# Patient Record
Sex: Female | Born: 1955 | Race: Black or African American | Hispanic: No | Smoking: Never smoker
Health system: Southern US, Community
[De-identification: ages and names within clinical notes are randomized; demographics above are authoritative.]

## PROBLEM LIST (undated history)

## (undated) DIAGNOSIS — M25551 Pain in right hip: Secondary | ICD-10-CM

## (undated) DIAGNOSIS — F32A Depression, unspecified: Secondary | ICD-10-CM

## (undated) DIAGNOSIS — F419 Anxiety disorder, unspecified: Secondary | ICD-10-CM

## (undated) DIAGNOSIS — M25519 Pain in unspecified shoulder: Secondary | ICD-10-CM

## (undated) DIAGNOSIS — F329 Major depressive disorder, single episode, unspecified: Secondary | ICD-10-CM

## (undated) DIAGNOSIS — I10 Essential (primary) hypertension: Secondary | ICD-10-CM

## (undated) DIAGNOSIS — E785 Hyperlipidemia, unspecified: Secondary | ICD-10-CM

## (undated) DIAGNOSIS — M1990 Unspecified osteoarthritis, unspecified site: Secondary | ICD-10-CM

## (undated) DIAGNOSIS — K5903 Drug induced constipation: Secondary | ICD-10-CM

## (undated) DIAGNOSIS — M75120 Complete rotator cuff tear or rupture of unspecified shoulder, not specified as traumatic: Secondary | ICD-10-CM

## (undated) DIAGNOSIS — E669 Obesity, unspecified: Secondary | ICD-10-CM

## (undated) DIAGNOSIS — S83249A Other tear of medial meniscus, current injury, unspecified knee, initial encounter: Secondary | ICD-10-CM

## (undated) DIAGNOSIS — M7542 Impingement syndrome of left shoulder: Secondary | ICD-10-CM

## (undated) DIAGNOSIS — S53003A Unspecified subluxation of unspecified radial head, initial encounter: Secondary | ICD-10-CM

## (undated) DIAGNOSIS — K529 Noninfective gastroenteritis and colitis, unspecified: Secondary | ICD-10-CM

## (undated) DIAGNOSIS — T402X5A Adverse effect of other opioids, initial encounter: Secondary | ICD-10-CM

## (undated) HISTORY — DX: Impingement syndrome of left shoulder: M75.42

## (undated) HISTORY — DX: Drug induced constipation: K59.03

## (undated) HISTORY — DX: Pain in right hip: M25.551

## (undated) HISTORY — DX: Pain in unspecified shoulder: M25.519

## (undated) HISTORY — PX: KNEE ARTHROSCOPY: SHX127

## (undated) HISTORY — DX: Depression, unspecified: F32.A

## (undated) HISTORY — PX: PARTIAL HIP ARTHROPLASTY: SHX733

## (undated) HISTORY — DX: Unspecified subluxation of unspecified radial head, initial encounter: S53.003A

## (undated) HISTORY — PX: FOOT SURGERY: SHX648

## (undated) HISTORY — DX: Essential (primary) hypertension: I10

## (undated) HISTORY — DX: Anxiety disorder, unspecified: F41.9

## (undated) HISTORY — DX: Obesity, unspecified: E66.9

## (undated) HISTORY — DX: Adverse effect of other opioids, initial encounter: T40.2X5A

## (undated) HISTORY — DX: Unspecified osteoarthritis, unspecified site: M19.90

## (undated) HISTORY — DX: Major depressive disorder, single episode, unspecified: F32.9

## (undated) HISTORY — DX: Hyperlipidemia, unspecified: E78.5

## (undated) HISTORY — PX: OTHER SURGICAL HISTORY: SHX169

## (undated) HISTORY — DX: Complete rotator cuff tear or rupture of unspecified shoulder, not specified as traumatic: M75.120

## (undated) HISTORY — DX: Other tear of medial meniscus, current injury, unspecified knee, initial encounter: S83.249A

## (undated) HISTORY — DX: Noninfective gastroenteritis and colitis, unspecified: K52.9

---

## 1993-10-28 HISTORY — PX: HIP FRACTURE SURGERY: SHX118

## 2001-02-07 ENCOUNTER — Encounter: Payer: Self-pay | Admitting: Emergency Medicine

## 2001-02-07 ENCOUNTER — Inpatient Hospital Stay (HOSPITAL_COMMUNITY): Admission: EM | Admit: 2001-02-07 | Discharge: 2001-02-16 | Payer: Self-pay | Admitting: *Deleted

## 2001-02-20 ENCOUNTER — Encounter: Payer: Self-pay | Admitting: Emergency Medicine

## 2001-02-20 ENCOUNTER — Emergency Department (HOSPITAL_COMMUNITY): Admission: EM | Admit: 2001-02-20 | Discharge: 2001-02-20 | Payer: Self-pay | Admitting: Emergency Medicine

## 2001-02-27 ENCOUNTER — Inpatient Hospital Stay (HOSPITAL_COMMUNITY): Admission: EM | Admit: 2001-02-27 | Discharge: 2001-03-02 | Payer: Self-pay | Admitting: *Deleted

## 2001-03-19 ENCOUNTER — Inpatient Hospital Stay (HOSPITAL_COMMUNITY): Admission: EM | Admit: 2001-03-19 | Discharge: 2001-03-20 | Payer: Self-pay | Admitting: *Deleted

## 2001-04-01 ENCOUNTER — Encounter: Payer: Self-pay | Admitting: Emergency Medicine

## 2001-04-01 ENCOUNTER — Emergency Department (HOSPITAL_COMMUNITY): Admission: EM | Admit: 2001-04-01 | Discharge: 2001-04-01 | Payer: Self-pay | Admitting: Emergency Medicine

## 2001-09-01 ENCOUNTER — Other Ambulatory Visit: Admission: RE | Admit: 2001-09-01 | Discharge: 2001-09-01 | Payer: Self-pay | Admitting: Obstetrics and Gynecology

## 2001-11-30 ENCOUNTER — Ambulatory Visit (HOSPITAL_COMMUNITY): Admission: RE | Admit: 2001-11-30 | Discharge: 2001-11-30 | Payer: Self-pay | Admitting: Internal Medicine

## 2001-11-30 ENCOUNTER — Encounter: Payer: Self-pay | Admitting: Internal Medicine

## 2002-01-05 ENCOUNTER — Inpatient Hospital Stay (HOSPITAL_COMMUNITY): Admission: EM | Admit: 2002-01-05 | Discharge: 2002-01-06 | Payer: Self-pay | Admitting: Emergency Medicine

## 2002-01-06 ENCOUNTER — Inpatient Hospital Stay (HOSPITAL_COMMUNITY): Admission: EM | Admit: 2002-01-06 | Discharge: 2002-01-08 | Payer: Self-pay | Admitting: *Deleted

## 2002-04-02 ENCOUNTER — Emergency Department (HOSPITAL_COMMUNITY): Admission: EM | Admit: 2002-04-02 | Discharge: 2002-04-02 | Payer: Self-pay | Admitting: Emergency Medicine

## 2002-09-27 ENCOUNTER — Ambulatory Visit (HOSPITAL_COMMUNITY): Admission: RE | Admit: 2002-09-27 | Discharge: 2002-09-27 | Payer: Self-pay | Admitting: Obstetrics and Gynecology

## 2002-09-27 ENCOUNTER — Encounter: Payer: Self-pay | Admitting: Obstetrics and Gynecology

## 2002-12-02 ENCOUNTER — Encounter: Payer: Self-pay | Admitting: Internal Medicine

## 2002-12-02 ENCOUNTER — Ambulatory Visit (HOSPITAL_COMMUNITY): Admission: RE | Admit: 2002-12-02 | Discharge: 2002-12-02 | Payer: Self-pay | Admitting: Internal Medicine

## 2004-02-02 ENCOUNTER — Ambulatory Visit (HOSPITAL_COMMUNITY): Admission: RE | Admit: 2004-02-02 | Discharge: 2004-02-02 | Payer: Self-pay | Admitting: Obstetrics and Gynecology

## 2004-03-06 ENCOUNTER — Inpatient Hospital Stay (HOSPITAL_COMMUNITY): Admission: EM | Admit: 2004-03-06 | Discharge: 2004-03-10 | Payer: Self-pay | Admitting: Emergency Medicine

## 2005-01-03 ENCOUNTER — Emergency Department (HOSPITAL_COMMUNITY): Admission: EM | Admit: 2005-01-03 | Discharge: 2005-01-03 | Payer: Self-pay | Admitting: Emergency Medicine

## 2005-02-25 ENCOUNTER — Ambulatory Visit (HOSPITAL_COMMUNITY): Admission: RE | Admit: 2005-02-25 | Discharge: 2005-02-25 | Payer: Self-pay | Admitting: Family Medicine

## 2005-03-27 ENCOUNTER — Emergency Department (HOSPITAL_COMMUNITY): Admission: EM | Admit: 2005-03-27 | Discharge: 2005-03-27 | Payer: Self-pay | Admitting: Emergency Medicine

## 2005-04-10 ENCOUNTER — Ambulatory Visit: Payer: Self-pay | Admitting: Orthopedic Surgery

## 2005-06-11 ENCOUNTER — Encounter (INDEPENDENT_AMBULATORY_CARE_PROVIDER_SITE_OTHER): Payer: Self-pay | Admitting: Family Medicine

## 2006-02-27 ENCOUNTER — Ambulatory Visit (HOSPITAL_COMMUNITY): Admission: RE | Admit: 2006-02-27 | Discharge: 2006-02-27 | Payer: Self-pay | Admitting: Family Medicine

## 2006-03-01 ENCOUNTER — Emergency Department (HOSPITAL_COMMUNITY): Admission: EM | Admit: 2006-03-01 | Discharge: 2006-03-01 | Payer: Self-pay | Admitting: Emergency Medicine

## 2006-03-19 ENCOUNTER — Ambulatory Visit: Payer: Self-pay | Admitting: Orthopedic Surgery

## 2006-05-08 ENCOUNTER — Ambulatory Visit (HOSPITAL_COMMUNITY): Admission: RE | Admit: 2006-05-08 | Discharge: 2006-05-08 | Payer: Self-pay | Admitting: Family Medicine

## 2006-06-10 ENCOUNTER — Ambulatory Visit: Payer: Self-pay | Admitting: Family Medicine

## 2006-06-11 ENCOUNTER — Encounter (INDEPENDENT_AMBULATORY_CARE_PROVIDER_SITE_OTHER): Payer: Self-pay | Admitting: Family Medicine

## 2006-06-11 LAB — CONVERTED CEMR LAB: Pap Smear: NORMAL

## 2006-06-24 ENCOUNTER — Ambulatory Visit: Payer: Self-pay | Admitting: Family Medicine

## 2006-06-24 ENCOUNTER — Ambulatory Visit (HOSPITAL_COMMUNITY): Admission: RE | Admit: 2006-06-24 | Discharge: 2006-06-24 | Payer: Self-pay | Admitting: Family Medicine

## 2006-07-10 ENCOUNTER — Ambulatory Visit: Payer: Self-pay | Admitting: Family Medicine

## 2006-07-25 ENCOUNTER — Ambulatory Visit: Payer: Self-pay | Admitting: Family Medicine

## 2006-09-11 ENCOUNTER — Ambulatory Visit (HOSPITAL_COMMUNITY): Admission: RE | Admit: 2006-09-11 | Discharge: 2006-09-11 | Payer: Self-pay | Admitting: Family Medicine

## 2006-09-11 ENCOUNTER — Ambulatory Visit: Payer: Self-pay | Admitting: Family Medicine

## 2006-09-16 ENCOUNTER — Encounter (INDEPENDENT_AMBULATORY_CARE_PROVIDER_SITE_OTHER): Payer: Self-pay | Admitting: Family Medicine

## 2006-09-16 ENCOUNTER — Ambulatory Visit (HOSPITAL_COMMUNITY): Admission: RE | Admit: 2006-09-16 | Discharge: 2006-09-16 | Payer: Self-pay | Admitting: Family Medicine

## 2006-09-25 ENCOUNTER — Ambulatory Visit: Payer: Self-pay | Admitting: Family Medicine

## 2006-09-26 ENCOUNTER — Encounter (INDEPENDENT_AMBULATORY_CARE_PROVIDER_SITE_OTHER): Payer: Self-pay | Admitting: Family Medicine

## 2006-09-27 ENCOUNTER — Encounter: Payer: Self-pay | Admitting: Family Medicine

## 2006-09-27 DIAGNOSIS — F329 Major depressive disorder, single episode, unspecified: Secondary | ICD-10-CM

## 2006-09-27 DIAGNOSIS — E785 Hyperlipidemia, unspecified: Secondary | ICD-10-CM

## 2006-09-27 DIAGNOSIS — IMO0002 Reserved for concepts with insufficient information to code with codable children: Secondary | ICD-10-CM | POA: Insufficient documentation

## 2006-09-27 DIAGNOSIS — E669 Obesity, unspecified: Secondary | ICD-10-CM

## 2006-09-27 DIAGNOSIS — F411 Generalized anxiety disorder: Secondary | ICD-10-CM | POA: Insufficient documentation

## 2006-09-27 DIAGNOSIS — I10 Essential (primary) hypertension: Secondary | ICD-10-CM | POA: Insufficient documentation

## 2006-09-27 DIAGNOSIS — M1990 Unspecified osteoarthritis, unspecified site: Secondary | ICD-10-CM | POA: Insufficient documentation

## 2006-09-29 ENCOUNTER — Ambulatory Visit: Payer: Self-pay | Admitting: Orthopedic Surgery

## 2006-10-03 ENCOUNTER — Ambulatory Visit (HOSPITAL_COMMUNITY): Admission: RE | Admit: 2006-10-03 | Discharge: 2006-10-03 | Payer: Self-pay | Admitting: Orthopedic Surgery

## 2006-10-03 ENCOUNTER — Ambulatory Visit: Payer: Self-pay | Admitting: Orthopedic Surgery

## 2006-10-03 HISTORY — PX: OTHER SURGICAL HISTORY: SHX169

## 2006-10-06 ENCOUNTER — Ambulatory Visit: Payer: Self-pay | Admitting: Orthopedic Surgery

## 2006-10-15 ENCOUNTER — Encounter (HOSPITAL_COMMUNITY): Admission: RE | Admit: 2006-10-15 | Discharge: 2006-10-27 | Payer: Self-pay | Admitting: Orthopedic Surgery

## 2006-10-29 ENCOUNTER — Ambulatory Visit: Payer: Self-pay | Admitting: Orthopedic Surgery

## 2006-10-30 ENCOUNTER — Encounter (HOSPITAL_COMMUNITY): Admission: RE | Admit: 2006-10-30 | Discharge: 2006-11-29 | Payer: Self-pay | Admitting: Orthopedic Surgery

## 2006-11-19 ENCOUNTER — Ambulatory Visit: Payer: Self-pay | Admitting: Orthopedic Surgery

## 2006-12-02 ENCOUNTER — Encounter (HOSPITAL_COMMUNITY): Admission: RE | Admit: 2006-12-02 | Discharge: 2007-01-01 | Payer: Self-pay | Admitting: Orthopedic Surgery

## 2006-12-04 ENCOUNTER — Ambulatory Visit: Payer: Self-pay | Admitting: Family Medicine

## 2006-12-04 LAB — CONVERTED CEMR LAB
ALT: 10 units/L (ref 0–35)
Basophils Absolute: 0 10*3/uL (ref 0.0–0.1)
CO2: 25 meq/L (ref 19–32)
Calcium: 9.7 mg/dL (ref 8.4–10.5)
Chloride: 100 meq/L (ref 96–112)
Cholesterol: 154 mg/dL (ref 0–200)
Glucose, Bld: 100 mg/dL — ABNORMAL HIGH (ref 70–99)
Hemoglobin: 12.3 g/dL (ref 12.0–15.0)
Lymphocytes Relative: 30 % (ref 12–46)
Lymphs Abs: 2 10*3/uL (ref 0.7–3.3)
Monocytes Absolute: 0.6 10*3/uL (ref 0.2–0.7)
Neutro Abs: 3.7 10*3/uL (ref 1.7–7.7)
Platelets: 270 10*3/uL (ref 150–400)
RDW: 13.5 % (ref 11.5–14.0)
Sodium: 137 meq/L (ref 135–145)
Total Protein: 7.4 g/dL (ref 6.0–8.3)
WBC: 6.6 10*3/uL (ref 4.0–10.5)

## 2006-12-05 ENCOUNTER — Telehealth (INDEPENDENT_AMBULATORY_CARE_PROVIDER_SITE_OTHER): Payer: Self-pay | Admitting: Family Medicine

## 2007-01-19 ENCOUNTER — Encounter (HOSPITAL_COMMUNITY): Admission: RE | Admit: 2007-01-19 | Discharge: 2007-02-18 | Payer: Self-pay | Admitting: Podiatry

## 2007-03-03 ENCOUNTER — Ambulatory Visit (HOSPITAL_COMMUNITY): Admission: RE | Admit: 2007-03-03 | Discharge: 2007-03-03 | Payer: Self-pay | Admitting: Family Medicine

## 2007-03-05 ENCOUNTER — Ambulatory Visit: Payer: Self-pay | Admitting: Family Medicine

## 2007-03-05 LAB — CONVERTED CEMR LAB
Cholesterol, target level: 200 mg/dL
HDL goal, serum: 40 mg/dL
LDL Goal: 160 mg/dL

## 2007-03-11 ENCOUNTER — Emergency Department (HOSPITAL_COMMUNITY): Admission: EM | Admit: 2007-03-11 | Discharge: 2007-03-11 | Payer: Self-pay | Admitting: *Deleted

## 2007-03-17 ENCOUNTER — Ambulatory Visit (HOSPITAL_COMMUNITY): Admission: RE | Admit: 2007-03-17 | Discharge: 2007-03-17 | Payer: Self-pay | Admitting: Family Medicine

## 2007-03-19 ENCOUNTER — Emergency Department (HOSPITAL_COMMUNITY): Admission: EM | Admit: 2007-03-19 | Discharge: 2007-03-19 | Payer: Self-pay | Admitting: Emergency Medicine

## 2007-03-24 ENCOUNTER — Encounter (INDEPENDENT_AMBULATORY_CARE_PROVIDER_SITE_OTHER): Payer: Self-pay | Admitting: Family Medicine

## 2007-04-02 ENCOUNTER — Ambulatory Visit: Payer: Self-pay | Admitting: Orthopedic Surgery

## 2007-04-03 ENCOUNTER — Ambulatory Visit: Payer: Self-pay | Admitting: Family Medicine

## 2007-05-19 ENCOUNTER — Ambulatory Visit: Payer: Self-pay | Admitting: Orthopedic Surgery

## 2007-05-26 ENCOUNTER — Encounter (HOSPITAL_COMMUNITY): Admission: RE | Admit: 2007-05-26 | Discharge: 2007-06-25 | Payer: Self-pay | Admitting: Orthopedic Surgery

## 2007-06-01 ENCOUNTER — Ambulatory Visit: Payer: Self-pay | Admitting: Orthopedic Surgery

## 2007-07-22 ENCOUNTER — Ambulatory Visit: Payer: Self-pay | Admitting: Family Medicine

## 2007-07-23 ENCOUNTER — Telehealth (INDEPENDENT_AMBULATORY_CARE_PROVIDER_SITE_OTHER): Payer: Self-pay | Admitting: *Deleted

## 2007-07-23 ENCOUNTER — Telehealth (INDEPENDENT_AMBULATORY_CARE_PROVIDER_SITE_OTHER): Payer: Self-pay | Admitting: Family Medicine

## 2007-07-23 LAB — CONVERTED CEMR LAB
AST: 15 units/L (ref 0–37)
Albumin: 3.9 g/dL (ref 3.5–5.2)
BUN: 13 mg/dL (ref 6–23)
Basophils Relative: 0 % (ref 0–1)
Calcium: 9.5 mg/dL (ref 8.4–10.5)
Chloride: 106 meq/L (ref 96–112)
Lymphocytes Relative: 44 % (ref 12–46)
Lymphs Abs: 2.9 10*3/uL (ref 0.7–3.3)
MCHC: 33 g/dL (ref 30.0–36.0)
Monocytes Relative: 6 % (ref 3–11)
Neutro Abs: 3.1 10*3/uL (ref 1.7–7.7)
Neutrophils Relative %: 47 % (ref 43–77)
Potassium: 4 meq/L (ref 3.5–5.3)
RBC: 4.3 M/uL (ref 3.87–5.11)
WBC: 6.7 10*3/uL (ref 4.0–10.5)

## 2007-07-26 ENCOUNTER — Encounter (INDEPENDENT_AMBULATORY_CARE_PROVIDER_SITE_OTHER): Payer: Self-pay | Admitting: Family Medicine

## 2007-08-20 ENCOUNTER — Other Ambulatory Visit: Admission: RE | Admit: 2007-08-20 | Discharge: 2007-08-20 | Payer: Self-pay | Admitting: Family Medicine

## 2007-08-20 ENCOUNTER — Encounter (INDEPENDENT_AMBULATORY_CARE_PROVIDER_SITE_OTHER): Payer: Self-pay | Admitting: Family Medicine

## 2007-08-20 ENCOUNTER — Ambulatory Visit: Payer: Self-pay | Admitting: Family Medicine

## 2007-08-26 ENCOUNTER — Telehealth (INDEPENDENT_AMBULATORY_CARE_PROVIDER_SITE_OTHER): Payer: Self-pay | Admitting: *Deleted

## 2007-10-02 ENCOUNTER — Encounter: Payer: Self-pay | Admitting: Orthopedic Surgery

## 2007-10-20 ENCOUNTER — Ambulatory Visit: Payer: Self-pay | Admitting: Orthopedic Surgery

## 2007-10-29 HISTORY — PX: OTHER SURGICAL HISTORY: SHX169

## 2007-11-08 ENCOUNTER — Encounter (INDEPENDENT_AMBULATORY_CARE_PROVIDER_SITE_OTHER): Payer: Self-pay | Admitting: Family Medicine

## 2007-11-19 ENCOUNTER — Telehealth (INDEPENDENT_AMBULATORY_CARE_PROVIDER_SITE_OTHER): Payer: Self-pay | Admitting: *Deleted

## 2007-11-19 ENCOUNTER — Ambulatory Visit: Payer: Self-pay | Admitting: Family Medicine

## 2007-11-27 ENCOUNTER — Ambulatory Visit: Payer: Self-pay | Admitting: Family Medicine

## 2007-12-02 ENCOUNTER — Ambulatory Visit: Payer: Self-pay | Admitting: Family Medicine

## 2007-12-02 LAB — CONVERTED CEMR LAB: Inflenza A Ag: NEGATIVE

## 2007-12-17 ENCOUNTER — Ambulatory Visit: Payer: Self-pay | Admitting: Family Medicine

## 2007-12-18 ENCOUNTER — Telehealth (INDEPENDENT_AMBULATORY_CARE_PROVIDER_SITE_OTHER): Payer: Self-pay | Admitting: *Deleted

## 2007-12-18 LAB — CONVERTED CEMR LAB
ALT: 10 units/L (ref 0–35)
AST: 15 units/L (ref 0–37)
Alkaline Phosphatase: 72 units/L (ref 39–117)
Basophils Absolute: 0 10*3/uL (ref 0.0–0.1)
Basophils Relative: 0 % (ref 0–1)
Creatinine, Ser: 0.83 mg/dL (ref 0.40–1.20)
Eosinophils Relative: 5 % (ref 0–5)
HCT: 40 % (ref 36.0–46.0)
Hemoglobin: 13 g/dL (ref 12.0–15.0)
MCHC: 32.5 g/dL (ref 30.0–36.0)
Monocytes Absolute: 0.4 10*3/uL (ref 0.1–1.0)
Neutro Abs: 3.2 10*3/uL (ref 1.7–7.7)
RDW: 13.6 % (ref 11.5–15.5)
TSH: 1.608 microintl units/mL (ref 0.350–5.50)
Total Bilirubin: 0.6 mg/dL (ref 0.3–1.2)
Total CHOL/HDL Ratio: 3.4
VLDL: 25 mg/dL (ref 0–40)

## 2007-12-22 ENCOUNTER — Encounter (INDEPENDENT_AMBULATORY_CARE_PROVIDER_SITE_OTHER): Payer: Self-pay | Admitting: Family Medicine

## 2008-01-25 ENCOUNTER — Ambulatory Visit: Payer: Self-pay | Admitting: Orthopedic Surgery

## 2008-01-25 DIAGNOSIS — M25519 Pain in unspecified shoulder: Secondary | ICD-10-CM

## 2008-02-17 ENCOUNTER — Ambulatory Visit: Payer: Self-pay | Admitting: Orthopedic Surgery

## 2008-02-23 ENCOUNTER — Ambulatory Visit (HOSPITAL_COMMUNITY): Admission: RE | Admit: 2008-02-23 | Discharge: 2008-02-23 | Payer: Self-pay | Admitting: Orthopedic Surgery

## 2008-03-01 ENCOUNTER — Ambulatory Visit: Payer: Self-pay | Admitting: Orthopedic Surgery

## 2008-03-08 ENCOUNTER — Ambulatory Visit: Payer: Self-pay | Admitting: Orthopedic Surgery

## 2008-03-15 ENCOUNTER — Telehealth: Payer: Self-pay | Admitting: Orthopedic Surgery

## 2008-03-17 ENCOUNTER — Ambulatory Visit: Payer: Self-pay | Admitting: Family Medicine

## 2008-03-17 DIAGNOSIS — N951 Menopausal and female climacteric states: Secondary | ICD-10-CM | POA: Insufficient documentation

## 2008-03-18 ENCOUNTER — Telehealth (INDEPENDENT_AMBULATORY_CARE_PROVIDER_SITE_OTHER): Payer: Self-pay | Admitting: Family Medicine

## 2008-03-18 ENCOUNTER — Emergency Department (HOSPITAL_COMMUNITY): Admission: RE | Admit: 2008-03-18 | Discharge: 2008-03-18 | Payer: Self-pay | Admitting: Orthopedic Surgery

## 2008-03-18 ENCOUNTER — Encounter (INDEPENDENT_AMBULATORY_CARE_PROVIDER_SITE_OTHER): Payer: Self-pay | Admitting: Family Medicine

## 2008-03-22 ENCOUNTER — Telehealth (INDEPENDENT_AMBULATORY_CARE_PROVIDER_SITE_OTHER): Payer: Self-pay | Admitting: Family Medicine

## 2008-03-22 ENCOUNTER — Ambulatory Visit: Payer: Self-pay | Admitting: Family Medicine

## 2008-03-23 ENCOUNTER — Encounter: Payer: Self-pay | Admitting: Orthopedic Surgery

## 2008-03-23 ENCOUNTER — Ambulatory Visit: Payer: Self-pay | Admitting: Cardiovascular Disease

## 2008-03-25 ENCOUNTER — Encounter: Payer: Self-pay | Admitting: Cardiovascular Disease

## 2008-03-25 ENCOUNTER — Ambulatory Visit (HOSPITAL_COMMUNITY): Admission: RE | Admit: 2008-03-25 | Discharge: 2008-03-25 | Payer: Self-pay | Admitting: Cardiovascular Disease

## 2008-03-25 ENCOUNTER — Ambulatory Visit: Payer: Self-pay | Admitting: Cardiology

## 2008-03-28 ENCOUNTER — Telehealth: Payer: Self-pay | Admitting: Orthopedic Surgery

## 2008-03-30 ENCOUNTER — Ambulatory Visit: Payer: Self-pay | Admitting: Orthopedic Surgery

## 2008-04-01 ENCOUNTER — Emergency Department (HOSPITAL_COMMUNITY): Admission: EM | Admit: 2008-04-01 | Discharge: 2008-04-01 | Payer: Self-pay | Admitting: Emergency Medicine

## 2008-04-01 ENCOUNTER — Encounter: Payer: Self-pay | Admitting: Orthopedic Surgery

## 2008-04-04 ENCOUNTER — Ambulatory Visit: Payer: Self-pay | Admitting: Orthopedic Surgery

## 2008-04-04 ENCOUNTER — Telehealth: Payer: Self-pay | Admitting: Orthopedic Surgery

## 2008-04-06 ENCOUNTER — Encounter (INDEPENDENT_AMBULATORY_CARE_PROVIDER_SITE_OTHER): Payer: Self-pay | Admitting: Family Medicine

## 2008-04-08 ENCOUNTER — Ambulatory Visit: Payer: Self-pay | Admitting: Orthopedic Surgery

## 2008-04-08 ENCOUNTER — Ambulatory Visit (HOSPITAL_COMMUNITY): Admission: RE | Admit: 2008-04-08 | Discharge: 2008-04-08 | Payer: Self-pay | Admitting: Orthopedic Surgery

## 2008-04-11 ENCOUNTER — Telehealth: Payer: Self-pay | Admitting: Orthopedic Surgery

## 2008-04-12 ENCOUNTER — Ambulatory Visit: Payer: Self-pay | Admitting: Orthopedic Surgery

## 2008-04-18 ENCOUNTER — Encounter (HOSPITAL_COMMUNITY): Admission: RE | Admit: 2008-04-18 | Discharge: 2008-05-18 | Payer: Self-pay | Admitting: Orthopedic Surgery

## 2008-04-18 ENCOUNTER — Encounter: Payer: Self-pay | Admitting: Orthopedic Surgery

## 2008-04-19 ENCOUNTER — Ambulatory Visit: Payer: Self-pay | Admitting: Family Medicine

## 2008-04-19 LAB — CONVERTED CEMR LAB
Bilirubin Urine: NEGATIVE
Protein, U semiquant: 100
Urobilinogen, UA: 1
pH: 6

## 2008-04-20 ENCOUNTER — Ambulatory Visit: Payer: Self-pay | Admitting: Orthopedic Surgery

## 2008-04-22 ENCOUNTER — Ambulatory Visit (HOSPITAL_COMMUNITY): Admission: RE | Admit: 2008-04-22 | Discharge: 2008-04-22 | Payer: Self-pay | Admitting: Family Medicine

## 2008-04-26 ENCOUNTER — Encounter: Payer: Self-pay | Admitting: Orthopedic Surgery

## 2008-05-12 ENCOUNTER — Telehealth: Payer: Self-pay | Admitting: Orthopedic Surgery

## 2008-05-18 ENCOUNTER — Encounter: Payer: Self-pay | Admitting: Orthopedic Surgery

## 2008-05-19 ENCOUNTER — Ambulatory Visit: Payer: Self-pay | Admitting: Orthopedic Surgery

## 2008-05-20 ENCOUNTER — Encounter (HOSPITAL_COMMUNITY): Admission: RE | Admit: 2008-05-20 | Discharge: 2008-06-19 | Payer: Self-pay | Admitting: Orthopedic Surgery

## 2008-05-23 ENCOUNTER — Telehealth (INDEPENDENT_AMBULATORY_CARE_PROVIDER_SITE_OTHER): Payer: Self-pay | Admitting: *Deleted

## 2008-06-03 ENCOUNTER — Telehealth (INDEPENDENT_AMBULATORY_CARE_PROVIDER_SITE_OTHER): Payer: Self-pay | Admitting: *Deleted

## 2008-06-06 ENCOUNTER — Encounter (INDEPENDENT_AMBULATORY_CARE_PROVIDER_SITE_OTHER): Payer: Self-pay | Admitting: Family Medicine

## 2008-06-06 ENCOUNTER — Telehealth: Payer: Self-pay | Admitting: Orthopedic Surgery

## 2008-06-10 ENCOUNTER — Telehealth (INDEPENDENT_AMBULATORY_CARE_PROVIDER_SITE_OTHER): Payer: Self-pay | Admitting: Family Medicine

## 2008-06-12 ENCOUNTER — Encounter (INDEPENDENT_AMBULATORY_CARE_PROVIDER_SITE_OTHER): Payer: Self-pay | Admitting: Family Medicine

## 2008-06-14 ENCOUNTER — Ambulatory Visit: Payer: Self-pay | Admitting: Family Medicine

## 2008-06-15 ENCOUNTER — Encounter: Payer: Self-pay | Admitting: Orthopedic Surgery

## 2008-06-17 ENCOUNTER — Encounter (INDEPENDENT_AMBULATORY_CARE_PROVIDER_SITE_OTHER): Payer: Self-pay | Admitting: Family Medicine

## 2008-06-20 ENCOUNTER — Encounter (HOSPITAL_COMMUNITY): Admission: RE | Admit: 2008-06-20 | Discharge: 2008-07-20 | Payer: Self-pay | Admitting: Orthopedic Surgery

## 2008-06-20 ENCOUNTER — Telehealth: Payer: Self-pay | Admitting: Orthopedic Surgery

## 2008-06-22 ENCOUNTER — Telehealth: Payer: Self-pay | Admitting: Orthopedic Surgery

## 2008-06-22 ENCOUNTER — Ambulatory Visit: Payer: Self-pay | Admitting: Orthopedic Surgery

## 2008-07-13 ENCOUNTER — Encounter (INDEPENDENT_AMBULATORY_CARE_PROVIDER_SITE_OTHER): Payer: Self-pay | Admitting: Pulmonary Disease

## 2008-07-19 ENCOUNTER — Ambulatory Visit: Payer: Self-pay | Admitting: Orthopedic Surgery

## 2008-07-19 ENCOUNTER — Ambulatory Visit: Payer: Self-pay | Admitting: Family Medicine

## 2008-07-19 DIAGNOSIS — M25559 Pain in unspecified hip: Secondary | ICD-10-CM | POA: Insufficient documentation

## 2008-07-20 ENCOUNTER — Encounter (INDEPENDENT_AMBULATORY_CARE_PROVIDER_SITE_OTHER): Payer: Self-pay | Admitting: Family Medicine

## 2008-07-20 LAB — CONVERTED CEMR LAB
ALT: 10 units/L (ref 0–35)
BUN: 13 mg/dL (ref 6–23)
CO2: 22 meq/L (ref 19–32)
Creatinine, Ser: 0.61 mg/dL (ref 0.40–1.20)
Total Bilirubin: 0.7 mg/dL (ref 0.3–1.2)

## 2008-07-22 ENCOUNTER — Encounter (HOSPITAL_COMMUNITY): Admission: RE | Admit: 2008-07-22 | Discharge: 2008-07-27 | Payer: Self-pay | Admitting: Orthopedic Surgery

## 2008-07-25 ENCOUNTER — Telehealth (INDEPENDENT_AMBULATORY_CARE_PROVIDER_SITE_OTHER): Payer: Self-pay | Admitting: *Deleted

## 2008-07-28 ENCOUNTER — Ambulatory Visit: Payer: Self-pay | Admitting: Family Medicine

## 2008-07-29 ENCOUNTER — Encounter (HOSPITAL_COMMUNITY): Admission: RE | Admit: 2008-07-29 | Discharge: 2008-08-28 | Payer: Self-pay | Admitting: Orthopedic Surgery

## 2008-07-29 ENCOUNTER — Telehealth: Payer: Self-pay | Admitting: Orthopedic Surgery

## 2008-07-29 ENCOUNTER — Encounter: Payer: Self-pay | Admitting: Orthopedic Surgery

## 2008-08-01 ENCOUNTER — Ambulatory Visit (HOSPITAL_COMMUNITY): Admission: RE | Admit: 2008-08-01 | Discharge: 2008-08-01 | Payer: Self-pay | Admitting: Family Medicine

## 2008-08-01 ENCOUNTER — Telehealth (INDEPENDENT_AMBULATORY_CARE_PROVIDER_SITE_OTHER): Payer: Self-pay | Admitting: Family Medicine

## 2008-08-10 ENCOUNTER — Encounter (HOSPITAL_COMMUNITY): Admission: RE | Admit: 2008-08-10 | Discharge: 2008-09-09 | Payer: Self-pay | Admitting: Family Medicine

## 2008-08-10 ENCOUNTER — Encounter (INDEPENDENT_AMBULATORY_CARE_PROVIDER_SITE_OTHER): Payer: Self-pay | Admitting: Family Medicine

## 2008-08-11 ENCOUNTER — Ambulatory Visit: Payer: Self-pay | Admitting: Family Medicine

## 2008-08-11 DIAGNOSIS — J309 Allergic rhinitis, unspecified: Secondary | ICD-10-CM | POA: Insufficient documentation

## 2008-08-12 ENCOUNTER — Encounter: Payer: Self-pay | Admitting: Orthopedic Surgery

## 2008-08-12 ENCOUNTER — Encounter (INDEPENDENT_AMBULATORY_CARE_PROVIDER_SITE_OTHER): Payer: Self-pay | Admitting: Family Medicine

## 2008-08-12 LAB — CONVERTED CEMR LAB
Albumin: 3.9 g/dL (ref 3.5–5.2)
CO2: 21 meq/L (ref 19–32)
Calcium: 9.3 mg/dL (ref 8.4–10.5)
Chloride: 104 meq/L (ref 96–112)
Glucose, Bld: 90 mg/dL (ref 70–99)
Sodium: 138 meq/L (ref 135–145)
Total Bilirubin: 0.5 mg/dL (ref 0.3–1.2)
Total Protein: 7.2 g/dL (ref 6.0–8.3)

## 2008-08-15 ENCOUNTER — Telehealth (INDEPENDENT_AMBULATORY_CARE_PROVIDER_SITE_OTHER): Payer: Self-pay | Admitting: *Deleted

## 2008-08-22 ENCOUNTER — Ambulatory Visit: Payer: Self-pay | Admitting: Orthopedic Surgery

## 2008-09-05 ENCOUNTER — Telehealth: Payer: Self-pay | Admitting: Orthopedic Surgery

## 2008-09-05 ENCOUNTER — Telehealth (INDEPENDENT_AMBULATORY_CARE_PROVIDER_SITE_OTHER): Payer: Self-pay | Admitting: *Deleted

## 2008-09-07 ENCOUNTER — Encounter (HOSPITAL_COMMUNITY): Admission: RE | Admit: 2008-09-07 | Discharge: 2008-10-07 | Payer: Self-pay | Admitting: Oncology

## 2008-09-07 ENCOUNTER — Ambulatory Visit (HOSPITAL_COMMUNITY): Admission: RE | Admit: 2008-09-07 | Discharge: 2008-09-07 | Payer: Self-pay | Admitting: Family Medicine

## 2008-09-12 ENCOUNTER — Encounter (HOSPITAL_COMMUNITY): Admission: RE | Admit: 2008-09-12 | Discharge: 2008-10-12 | Payer: Self-pay | Admitting: Family Medicine

## 2008-09-26 ENCOUNTER — Ambulatory Visit: Payer: Self-pay | Admitting: Family Medicine

## 2008-09-26 ENCOUNTER — Ambulatory Visit: Payer: Self-pay | Admitting: Orthopedic Surgery

## 2008-09-26 DIAGNOSIS — M169 Osteoarthritis of hip, unspecified: Secondary | ICD-10-CM | POA: Insufficient documentation

## 2008-09-26 DIAGNOSIS — M1610 Unilateral primary osteoarthritis, unspecified hip: Secondary | ICD-10-CM | POA: Insufficient documentation

## 2008-10-28 HISTORY — PX: OTHER SURGICAL HISTORY: SHX169

## 2008-10-31 ENCOUNTER — Telehealth: Payer: Self-pay | Admitting: Orthopedic Surgery

## 2008-11-04 ENCOUNTER — Telehealth (INDEPENDENT_AMBULATORY_CARE_PROVIDER_SITE_OTHER): Payer: Self-pay | Admitting: *Deleted

## 2008-11-07 ENCOUNTER — Other Ambulatory Visit: Admission: RE | Admit: 2008-11-07 | Discharge: 2008-11-07 | Payer: Self-pay | Admitting: Obstetrics and Gynecology

## 2008-11-07 LAB — CONVERTED CEMR LAB: Pap Smear: NORMAL

## 2008-11-15 ENCOUNTER — Encounter: Payer: Self-pay | Admitting: Orthopedic Surgery

## 2008-11-18 ENCOUNTER — Telehealth: Payer: Self-pay | Admitting: Orthopedic Surgery

## 2008-11-18 ENCOUNTER — Ambulatory Visit: Payer: Self-pay | Admitting: Family Medicine

## 2008-11-21 ENCOUNTER — Encounter (INDEPENDENT_AMBULATORY_CARE_PROVIDER_SITE_OTHER): Payer: Self-pay | Admitting: Family Medicine

## 2008-11-22 ENCOUNTER — Inpatient Hospital Stay (HOSPITAL_COMMUNITY): Admission: RE | Admit: 2008-11-22 | Discharge: 2008-11-28 | Payer: Self-pay | Admitting: Orthopedic Surgery

## 2008-11-22 ENCOUNTER — Ambulatory Visit: Payer: Self-pay | Admitting: Orthopedic Surgery

## 2008-11-28 ENCOUNTER — Encounter: Payer: Self-pay | Admitting: Orthopedic Surgery

## 2008-11-29 ENCOUNTER — Telehealth: Payer: Self-pay | Admitting: Orthopedic Surgery

## 2008-11-30 ENCOUNTER — Encounter (INDEPENDENT_AMBULATORY_CARE_PROVIDER_SITE_OTHER): Payer: Self-pay | Admitting: Family Medicine

## 2008-11-30 ENCOUNTER — Telehealth: Payer: Self-pay | Admitting: Orthopedic Surgery

## 2008-12-06 ENCOUNTER — Telehealth: Payer: Self-pay | Admitting: Orthopedic Surgery

## 2008-12-07 ENCOUNTER — Ambulatory Visit: Payer: Self-pay | Admitting: Orthopedic Surgery

## 2008-12-14 ENCOUNTER — Telehealth: Payer: Self-pay | Admitting: Orthopedic Surgery

## 2008-12-14 ENCOUNTER — Encounter: Payer: Self-pay | Admitting: Orthopedic Surgery

## 2008-12-20 ENCOUNTER — Telehealth: Payer: Self-pay | Admitting: Orthopedic Surgery

## 2008-12-21 ENCOUNTER — Telehealth: Payer: Self-pay | Admitting: Orthopedic Surgery

## 2008-12-22 ENCOUNTER — Encounter: Payer: Self-pay | Admitting: Orthopedic Surgery

## 2008-12-26 ENCOUNTER — Telehealth: Payer: Self-pay | Admitting: Orthopedic Surgery

## 2008-12-26 ENCOUNTER — Telehealth (INDEPENDENT_AMBULATORY_CARE_PROVIDER_SITE_OTHER): Payer: Self-pay | Admitting: *Deleted

## 2008-12-26 ENCOUNTER — Encounter (INDEPENDENT_AMBULATORY_CARE_PROVIDER_SITE_OTHER): Payer: Self-pay | Admitting: Family Medicine

## 2008-12-28 ENCOUNTER — Encounter: Payer: Self-pay | Admitting: Orthopedic Surgery

## 2009-01-03 ENCOUNTER — Encounter (HOSPITAL_COMMUNITY): Admission: RE | Admit: 2009-01-03 | Discharge: 2009-02-02 | Payer: Self-pay | Admitting: Orthopedic Surgery

## 2009-01-04 ENCOUNTER — Ambulatory Visit: Payer: Self-pay | Admitting: Orthopedic Surgery

## 2009-01-10 ENCOUNTER — Telehealth: Payer: Self-pay | Admitting: Orthopedic Surgery

## 2009-01-12 ENCOUNTER — Ambulatory Visit (HOSPITAL_COMMUNITY): Admission: RE | Admit: 2009-01-12 | Discharge: 2009-01-12 | Payer: Self-pay | Admitting: Orthopedic Surgery

## 2009-01-17 ENCOUNTER — Telehealth: Payer: Self-pay | Admitting: Orthopedic Surgery

## 2009-01-18 ENCOUNTER — Telehealth: Payer: Self-pay | Admitting: Orthopedic Surgery

## 2009-01-25 ENCOUNTER — Encounter: Admission: RE | Admit: 2009-01-25 | Discharge: 2009-01-25 | Payer: Self-pay | Admitting: Internal Medicine

## 2009-01-31 ENCOUNTER — Telehealth: Payer: Self-pay | Admitting: Orthopedic Surgery

## 2009-02-06 ENCOUNTER — Telehealth: Payer: Self-pay | Admitting: Orthopedic Surgery

## 2009-02-07 ENCOUNTER — Encounter (HOSPITAL_COMMUNITY): Admission: RE | Admit: 2009-02-07 | Discharge: 2009-03-28 | Payer: Self-pay | Admitting: Orthopedic Surgery

## 2009-02-08 ENCOUNTER — Encounter: Payer: Self-pay | Admitting: Orthopedic Surgery

## 2009-02-10 ENCOUNTER — Encounter: Admission: RE | Admit: 2009-02-10 | Discharge: 2009-02-10 | Payer: Self-pay | Admitting: Orthopedic Surgery

## 2009-02-14 ENCOUNTER — Encounter: Payer: Self-pay | Admitting: Orthopedic Surgery

## 2009-02-15 ENCOUNTER — Encounter (INDEPENDENT_AMBULATORY_CARE_PROVIDER_SITE_OTHER): Payer: Self-pay | Admitting: Family Medicine

## 2009-02-15 ENCOUNTER — Ambulatory Visit: Payer: Self-pay | Admitting: Orthopedic Surgery

## 2009-03-03 ENCOUNTER — Encounter (INDEPENDENT_AMBULATORY_CARE_PROVIDER_SITE_OTHER): Payer: Self-pay | Admitting: Family Medicine

## 2009-03-06 ENCOUNTER — Telehealth: Payer: Self-pay | Admitting: Orthopedic Surgery

## 2009-03-08 ENCOUNTER — Ambulatory Visit: Payer: Self-pay | Admitting: Family Medicine

## 2009-03-08 DIAGNOSIS — R21 Rash and other nonspecific skin eruption: Secondary | ICD-10-CM | POA: Insufficient documentation

## 2009-03-09 ENCOUNTER — Encounter (INDEPENDENT_AMBULATORY_CARE_PROVIDER_SITE_OTHER): Payer: Self-pay | Admitting: Family Medicine

## 2009-03-09 LAB — CONVERTED CEMR LAB
ALT: 17 units/L (ref 0–35)
AST: 18 units/L (ref 0–37)
Albumin: 3.9 g/dL (ref 3.5–5.2)
Alkaline Phosphatase: 93 units/L (ref 39–117)
Basophils Absolute: 0 10*3/uL (ref 0.0–0.1)
Calcium: 9.7 mg/dL (ref 8.4–10.5)
Chloride: 104 meq/L (ref 96–112)
HCT: 38.6 % (ref 36.0–46.0)
HDL: 63 mg/dL (ref >39)
LDL Cholesterol: 101 mg/dL — ABNORMAL HIGH (ref 0–99)
Lymphocytes Relative: 37 % (ref 12–46)
Lymphs Abs: 2.3 10*3/uL (ref 0.7–4.0)
Neutro Abs: 3.2 10*3/uL (ref 1.7–7.7)
Neutrophils Relative %: 52 % (ref 43–77)
Platelets: 295 10*3/uL (ref 150–400)
Potassium: 4.2 meq/L (ref 3.5–5.3)
RDW: 13.5 % (ref 11.5–15.5)
Sodium: 140 meq/L (ref 135–145)
TSH: 0.252 microintl units/mL — ABNORMAL LOW (ref 0.350–4.500)
WBC: 6.1 10*3/uL (ref 4.0–10.5)

## 2009-03-10 LAB — CONVERTED CEMR LAB: Free T4: 1.03 ng/dL (ref 0.80–1.80)

## 2009-03-14 ENCOUNTER — Encounter: Payer: Self-pay | Admitting: Orthopedic Surgery

## 2009-03-21 ENCOUNTER — Telehealth: Payer: Self-pay | Admitting: Orthopedic Surgery

## 2009-03-21 ENCOUNTER — Telehealth (INDEPENDENT_AMBULATORY_CARE_PROVIDER_SITE_OTHER): Payer: Self-pay | Admitting: *Deleted

## 2009-03-22 ENCOUNTER — Ambulatory Visit: Payer: Self-pay | Admitting: Family Medicine

## 2009-03-29 ENCOUNTER — Ambulatory Visit: Payer: Self-pay | Admitting: Orthopedic Surgery

## 2009-03-29 DIAGNOSIS — R209 Unspecified disturbances of skin sensation: Secondary | ICD-10-CM

## 2009-03-29 DIAGNOSIS — M7580 Other shoulder lesions, unspecified shoulder: Secondary | ICD-10-CM

## 2009-03-30 ENCOUNTER — Encounter (INDEPENDENT_AMBULATORY_CARE_PROVIDER_SITE_OTHER): Payer: Self-pay | Admitting: Family Medicine

## 2009-03-31 ENCOUNTER — Encounter (INDEPENDENT_AMBULATORY_CARE_PROVIDER_SITE_OTHER): Payer: Self-pay | Admitting: *Deleted

## 2009-04-17 ENCOUNTER — Telehealth: Payer: Self-pay | Admitting: Orthopedic Surgery

## 2009-04-18 ENCOUNTER — Encounter: Payer: Self-pay | Admitting: Orthopedic Surgery

## 2009-04-19 ENCOUNTER — Ambulatory Visit: Payer: Self-pay | Admitting: Family Medicine

## 2009-04-19 DIAGNOSIS — E079 Disorder of thyroid, unspecified: Secondary | ICD-10-CM | POA: Insufficient documentation

## 2009-04-25 ENCOUNTER — Telehealth: Payer: Self-pay | Admitting: Orthopedic Surgery

## 2009-04-28 ENCOUNTER — Ambulatory Visit (HOSPITAL_COMMUNITY): Admission: RE | Admit: 2009-04-28 | Discharge: 2009-04-28 | Payer: Self-pay | Admitting: Orthopedic Surgery

## 2009-05-03 ENCOUNTER — Ambulatory Visit: Payer: Self-pay | Admitting: Orthopedic Surgery

## 2009-05-03 DIAGNOSIS — M75120 Complete rotator cuff tear or rupture of unspecified shoulder, not specified as traumatic: Secondary | ICD-10-CM

## 2009-05-09 ENCOUNTER — Encounter (INDEPENDENT_AMBULATORY_CARE_PROVIDER_SITE_OTHER): Payer: Self-pay | Admitting: *Deleted

## 2009-05-17 ENCOUNTER — Ambulatory Visit: Payer: Self-pay | Admitting: Orthopedic Surgery

## 2009-05-18 ENCOUNTER — Encounter: Payer: Self-pay | Admitting: Orthopedic Surgery

## 2009-05-22 ENCOUNTER — Ambulatory Visit (HOSPITAL_COMMUNITY): Admission: RE | Admit: 2009-05-22 | Discharge: 2009-05-22 | Payer: Self-pay | Admitting: Orthopedic Surgery

## 2009-05-23 ENCOUNTER — Telehealth: Payer: Self-pay | Admitting: Orthopedic Surgery

## 2009-05-25 ENCOUNTER — Telehealth: Payer: Self-pay | Admitting: Orthopedic Surgery

## 2009-06-05 ENCOUNTER — Ambulatory Visit: Payer: Self-pay | Admitting: Orthopedic Surgery

## 2009-06-06 ENCOUNTER — Encounter: Payer: Self-pay | Admitting: Orthopedic Surgery

## 2009-06-14 ENCOUNTER — Telehealth: Payer: Self-pay | Admitting: Orthopedic Surgery

## 2009-06-20 ENCOUNTER — Telehealth: Payer: Self-pay | Admitting: Orthopedic Surgery

## 2009-06-21 ENCOUNTER — Telehealth: Payer: Self-pay | Admitting: Orthopedic Surgery

## 2009-06-26 ENCOUNTER — Encounter (INDEPENDENT_AMBULATORY_CARE_PROVIDER_SITE_OTHER): Payer: Self-pay | Admitting: *Deleted

## 2009-07-04 ENCOUNTER — Ambulatory Visit: Payer: Self-pay | Admitting: Orthopedic Surgery

## 2009-07-04 ENCOUNTER — Ambulatory Visit (HOSPITAL_COMMUNITY): Admission: RE | Admit: 2009-07-04 | Discharge: 2009-07-04 | Payer: Self-pay | Admitting: Orthopedic Surgery

## 2009-07-04 ENCOUNTER — Encounter: Payer: Self-pay | Admitting: Orthopedic Surgery

## 2009-07-05 ENCOUNTER — Telehealth: Payer: Self-pay | Admitting: Orthopedic Surgery

## 2009-07-06 ENCOUNTER — Ambulatory Visit: Payer: Self-pay | Admitting: Orthopedic Surgery

## 2009-07-12 ENCOUNTER — Ambulatory Visit: Payer: Self-pay | Admitting: Family Medicine

## 2009-07-13 ENCOUNTER — Encounter (INDEPENDENT_AMBULATORY_CARE_PROVIDER_SITE_OTHER): Payer: Self-pay | Admitting: Family Medicine

## 2009-07-14 LAB — CONVERTED CEMR LAB
Free T4: 1.15 ng/dL (ref 0.80–1.80)
TSH: 0.397 microintl units/mL (ref 0.350–4.500)

## 2009-07-17 ENCOUNTER — Ambulatory Visit: Payer: Self-pay | Admitting: Orthopedic Surgery

## 2009-07-19 ENCOUNTER — Emergency Department (HOSPITAL_COMMUNITY): Admission: EM | Admit: 2009-07-19 | Discharge: 2009-07-19 | Payer: Self-pay | Admitting: Emergency Medicine

## 2009-07-20 ENCOUNTER — Emergency Department (HOSPITAL_COMMUNITY): Admission: EM | Admit: 2009-07-20 | Discharge: 2009-07-20 | Payer: Self-pay | Admitting: Emergency Medicine

## 2009-07-23 ENCOUNTER — Emergency Department (HOSPITAL_COMMUNITY): Admission: EM | Admit: 2009-07-23 | Discharge: 2009-07-23 | Payer: Self-pay | Admitting: Emergency Medicine

## 2009-07-24 ENCOUNTER — Telehealth: Payer: Self-pay | Admitting: Orthopedic Surgery

## 2009-07-24 ENCOUNTER — Encounter (INDEPENDENT_AMBULATORY_CARE_PROVIDER_SITE_OTHER): Payer: Self-pay | Admitting: Family Medicine

## 2009-07-24 ENCOUNTER — Encounter (INDEPENDENT_AMBULATORY_CARE_PROVIDER_SITE_OTHER): Payer: Self-pay | Admitting: *Deleted

## 2009-07-24 ENCOUNTER — Emergency Department (HOSPITAL_COMMUNITY): Admission: EM | Admit: 2009-07-24 | Discharge: 2009-07-24 | Payer: Self-pay | Admitting: Emergency Medicine

## 2009-07-28 ENCOUNTER — Encounter: Payer: Self-pay | Admitting: Orthopedic Surgery

## 2009-07-28 ENCOUNTER — Telehealth: Payer: Self-pay | Admitting: Orthopedic Surgery

## 2009-07-31 ENCOUNTER — Telehealth: Payer: Self-pay | Admitting: Orthopedic Surgery

## 2009-07-31 ENCOUNTER — Ambulatory Visit: Payer: Self-pay | Admitting: Orthopedic Surgery

## 2009-08-01 ENCOUNTER — Encounter (HOSPITAL_COMMUNITY): Admission: RE | Admit: 2009-08-01 | Discharge: 2009-08-31 | Payer: Self-pay | Admitting: Orthopedic Surgery

## 2009-08-01 ENCOUNTER — Encounter: Payer: Self-pay | Admitting: Orthopedic Surgery

## 2009-08-07 ENCOUNTER — Telehealth: Payer: Self-pay | Admitting: Orthopedic Surgery

## 2009-08-14 ENCOUNTER — Telehealth: Payer: Self-pay | Admitting: Orthopedic Surgery

## 2009-08-25 ENCOUNTER — Encounter: Payer: Self-pay | Admitting: Orthopedic Surgery

## 2009-08-28 ENCOUNTER — Ambulatory Visit: Payer: Self-pay | Admitting: Orthopedic Surgery

## 2009-09-01 ENCOUNTER — Encounter (HOSPITAL_COMMUNITY): Admission: RE | Admit: 2009-09-01 | Discharge: 2009-10-01 | Payer: Self-pay | Admitting: Orthopedic Surgery

## 2009-09-01 ENCOUNTER — Encounter: Payer: Self-pay | Admitting: Orthopedic Surgery

## 2009-09-11 ENCOUNTER — Telehealth: Payer: Self-pay | Admitting: Orthopedic Surgery

## 2009-09-12 ENCOUNTER — Telehealth: Payer: Self-pay | Admitting: Orthopedic Surgery

## 2009-09-12 ENCOUNTER — Encounter: Payer: Self-pay | Admitting: Orthopedic Surgery

## 2009-09-15 ENCOUNTER — Emergency Department (HOSPITAL_COMMUNITY): Admission: EM | Admit: 2009-09-15 | Discharge: 2009-09-15 | Payer: Self-pay | Admitting: Emergency Medicine

## 2009-09-25 ENCOUNTER — Telehealth: Payer: Self-pay | Admitting: Orthopedic Surgery

## 2009-09-29 ENCOUNTER — Encounter: Payer: Self-pay | Admitting: Orthopedic Surgery

## 2009-10-02 ENCOUNTER — Encounter (HOSPITAL_COMMUNITY): Admission: RE | Admit: 2009-10-02 | Discharge: 2009-10-27 | Payer: Self-pay | Admitting: Orthopedic Surgery

## 2009-10-09 ENCOUNTER — Ambulatory Visit: Payer: Self-pay | Admitting: Orthopedic Surgery

## 2009-10-11 ENCOUNTER — Ambulatory Visit (HOSPITAL_COMMUNITY): Admission: RE | Admit: 2009-10-11 | Discharge: 2009-10-11 | Payer: Self-pay | Admitting: Obstetrics and Gynecology

## 2009-10-26 ENCOUNTER — Telehealth: Payer: Self-pay | Admitting: Orthopedic Surgery

## 2009-10-30 ENCOUNTER — Encounter: Payer: Self-pay | Admitting: Orthopedic Surgery

## 2009-10-30 ENCOUNTER — Encounter (HOSPITAL_COMMUNITY): Admission: RE | Admit: 2009-10-30 | Discharge: 2009-11-29 | Payer: Self-pay | Admitting: Orthopedic Surgery

## 2009-11-08 ENCOUNTER — Telehealth: Payer: Self-pay | Admitting: Orthopedic Surgery

## 2009-11-11 ENCOUNTER — Emergency Department (HOSPITAL_COMMUNITY): Admission: EM | Admit: 2009-11-11 | Discharge: 2009-11-11 | Payer: Self-pay | Admitting: Emergency Medicine

## 2009-11-13 ENCOUNTER — Encounter: Payer: Self-pay | Admitting: Orthopedic Surgery

## 2009-11-22 ENCOUNTER — Telehealth: Payer: Self-pay | Admitting: Orthopedic Surgery

## 2009-11-27 ENCOUNTER — Encounter: Payer: Self-pay | Admitting: Orthopedic Surgery

## 2009-11-29 ENCOUNTER — Encounter: Payer: Self-pay | Admitting: Orthopedic Surgery

## 2009-12-13 ENCOUNTER — Other Ambulatory Visit: Admission: RE | Admit: 2009-12-13 | Discharge: 2009-12-13 | Payer: Self-pay | Admitting: Obstetrics and Gynecology

## 2009-12-20 ENCOUNTER — Telehealth: Payer: Self-pay | Admitting: Orthopedic Surgery

## 2009-12-21 ENCOUNTER — Encounter: Payer: Self-pay | Admitting: Orthopedic Surgery

## 2010-01-08 ENCOUNTER — Telehealth: Payer: Self-pay | Admitting: Orthopedic Surgery

## 2010-01-10 ENCOUNTER — Ambulatory Visit: Payer: Self-pay | Admitting: Orthopedic Surgery

## 2010-01-11 ENCOUNTER — Encounter: Payer: Self-pay | Admitting: Orthopedic Surgery

## 2010-01-11 LAB — CONVERTED CEMR LAB: Creatinine, Ser: 0.73 mg/dL (ref 0.40–1.20)

## 2010-01-23 ENCOUNTER — Telehealth: Payer: Self-pay | Admitting: Orthopedic Surgery

## 2010-01-26 ENCOUNTER — Ambulatory Visit (HOSPITAL_COMMUNITY): Admission: RE | Admit: 2010-01-26 | Discharge: 2010-01-26 | Payer: Self-pay | Admitting: Orthopedic Surgery

## 2010-02-01 ENCOUNTER — Ambulatory Visit: Payer: Self-pay | Admitting: Orthopedic Surgery

## 2010-02-05 IMAGING — CT CT ABDOMEN W/O CM
2 of 4 series · 17 of 46 positions shown, 19 images · non-contrast
Comparison: None

CT ABDOMEN

CLINICAL DATA: Left flank pain.  Back pain.

CT OF THE ABDOMEN AND PELVIS WITHOUT CONTRAST (CT UROGRAM)
TECHNIQUE: Multidetector CT imaging was performed through the
abdomen and pelvis to include the urinary tract.

[Series 2: standard/full over (age)lbs 5.0 · axial · 0.69mm/px · z∈[-407,+8]mm · 14 of 91 slices shown, 16 images]
[im 4/91  soft-tissue]
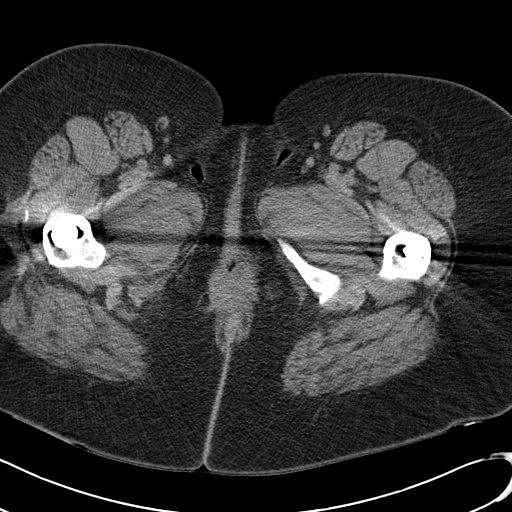
[im 4/91  bone]
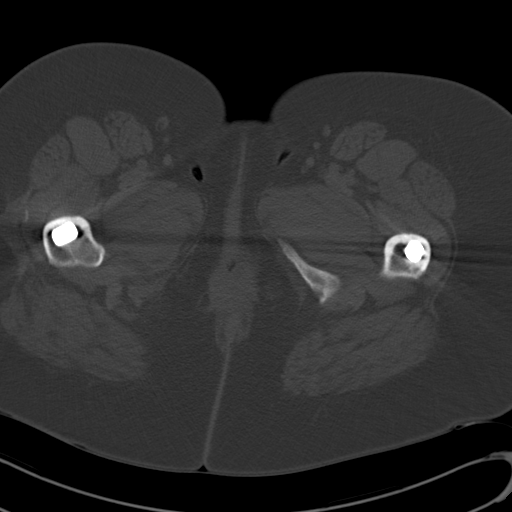
[im 12/91  soft-tissue]
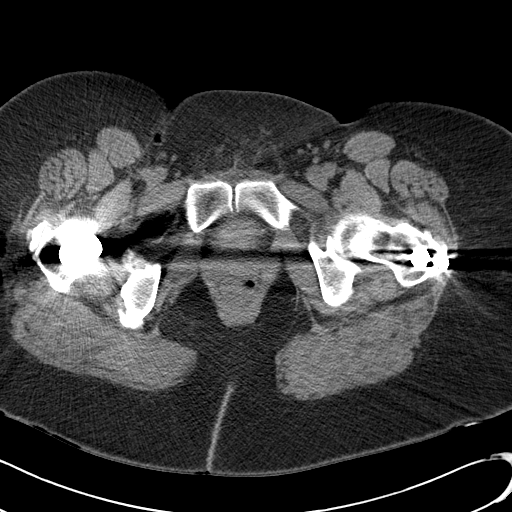
[im 19/91  soft-tissue]
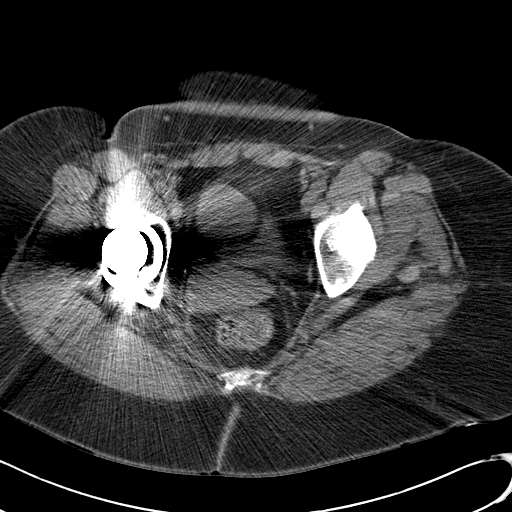
[im 23/91  soft-tissue]
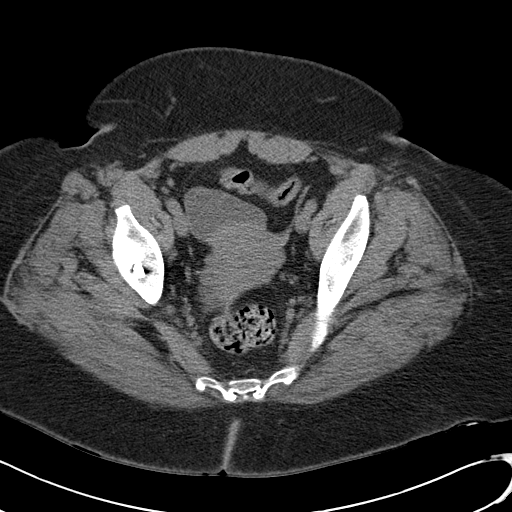
[im 31/91  soft-tissue]
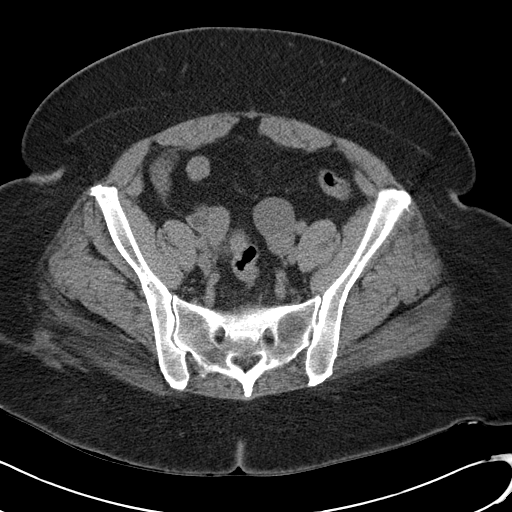
[im 38/91  soft-tissue]
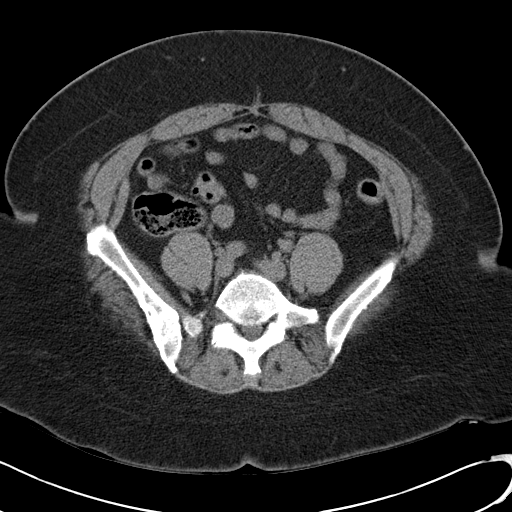
[im 42/91  soft-tissue]
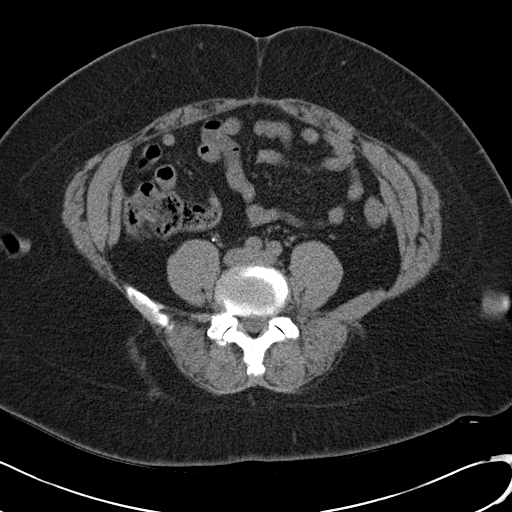
[im 49/91  soft-tissue]
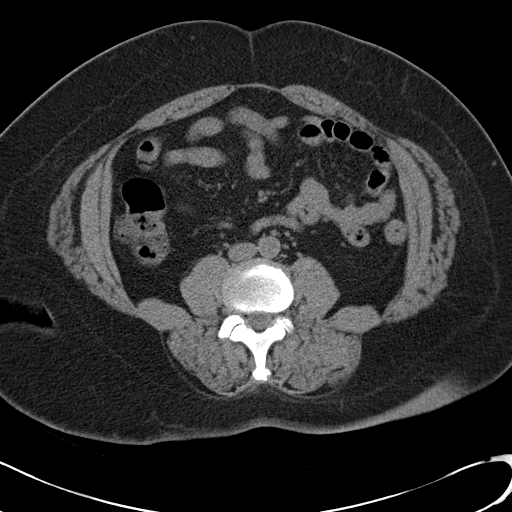
[im 53/91  soft-tissue]
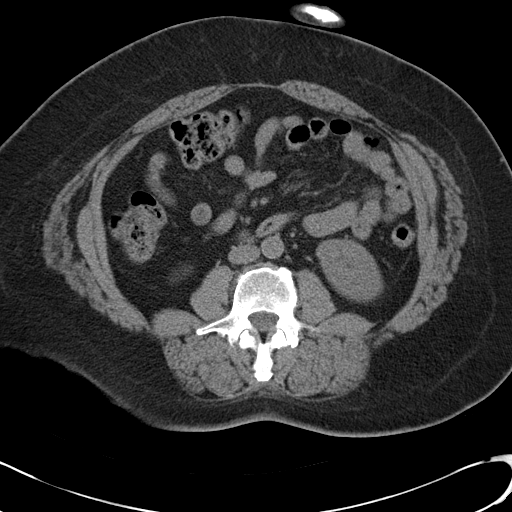
[im 53/91  bone]
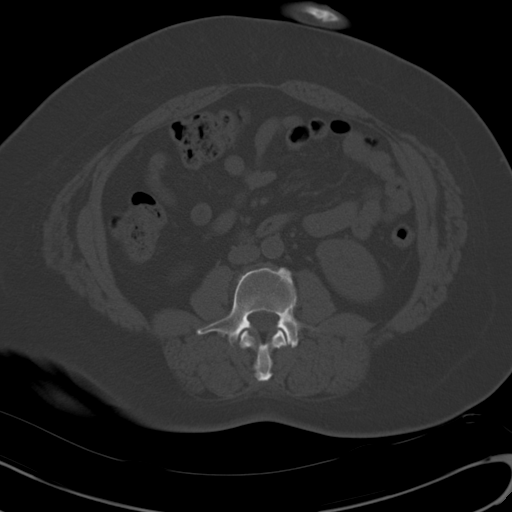
[im 61/91  soft-tissue]
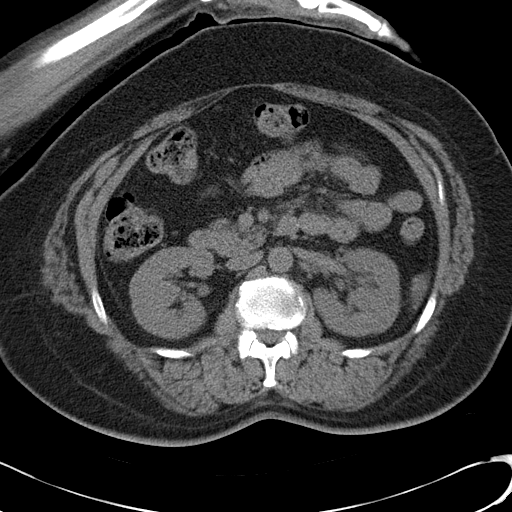
[im 68/91  soft-tissue]
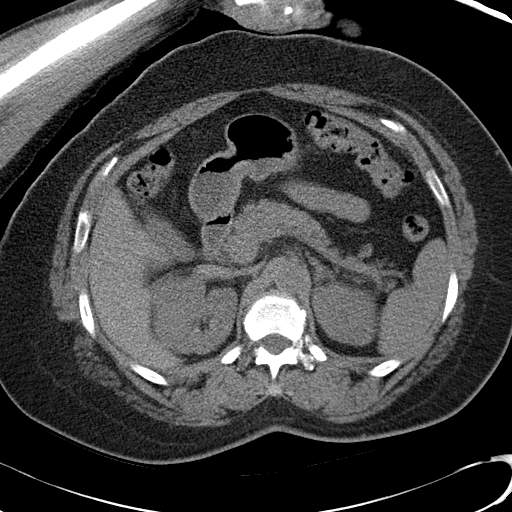
[im 72/91  soft-tissue]
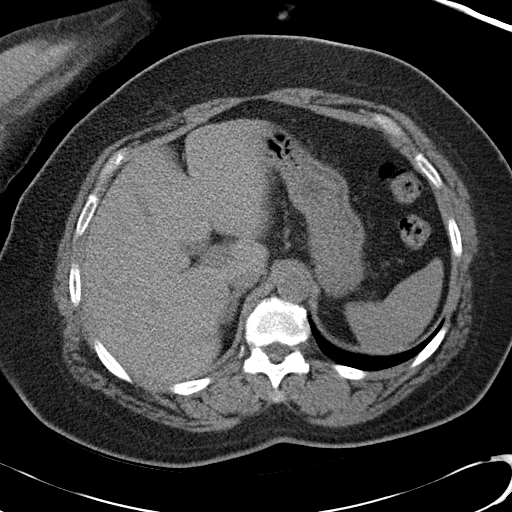
[im 79/91  soft-tissue]
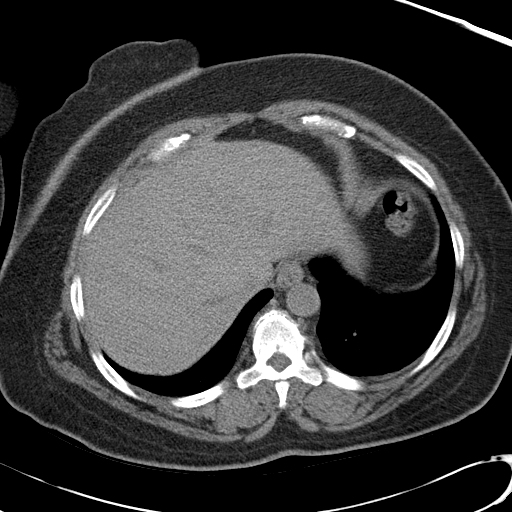
[im 87/91  soft-tissue]
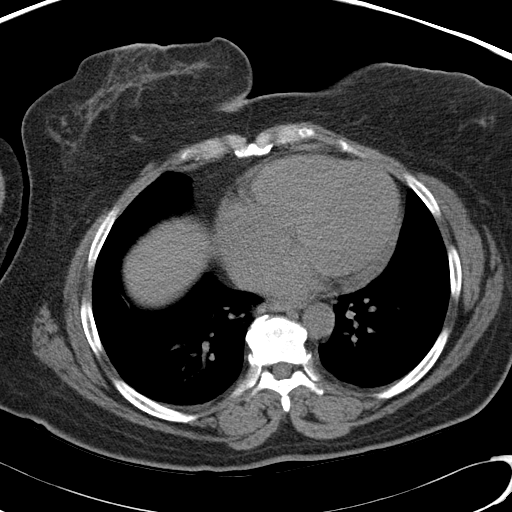

[Series 4: mpr coronal · coronal · 0.73mm/px · 3 of 86 slices shown]
[im 29/86  soft-tissue]
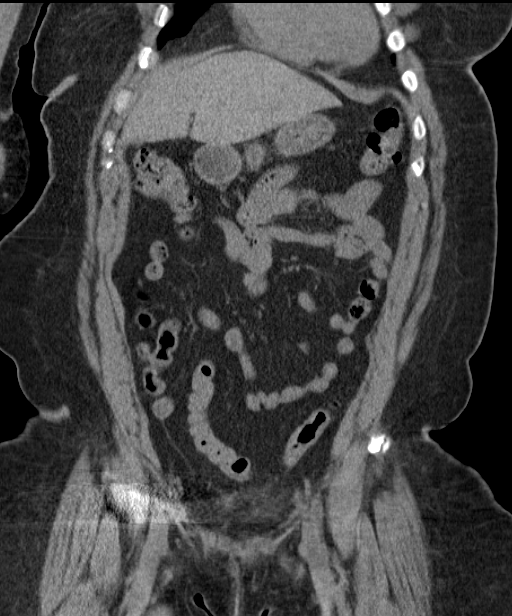
[im 38/86  soft-tissue]
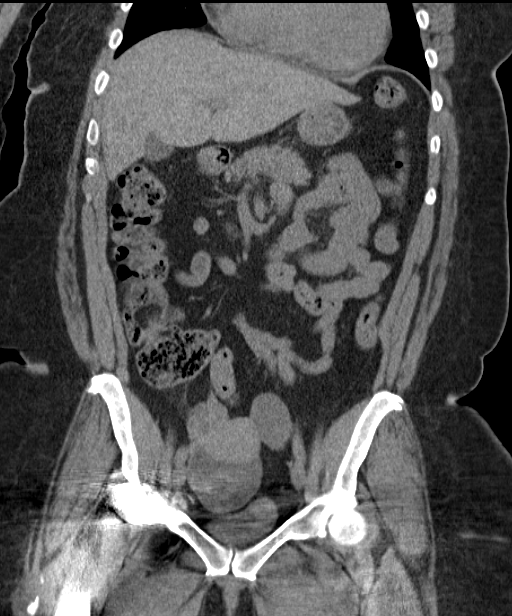
[im 48/86  soft-tissue]
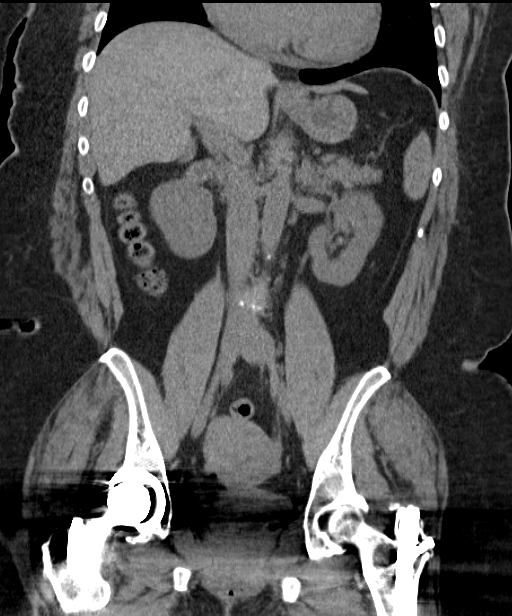

[17 of 46 positions shown; findings below may reference images not displayed]

FINDINGS: No evidence for acute urinary tract obstruction.  No
renal calculi.  No ureteral dilatation.Negative liver, spleen,
pancreas, kidneys, and adrenal glands.
IMPRESSION: No acute abdominal findings negative for acute urinary tract
obstruction.

CT PELVIS
FINDINGS: No acute pelvic findings.  No free fluid.  Bilateral
ovarian cyst.  Cyst on the left measures approximate 2.7 x 2.9 cm
and the cyst on the right measures 4.3 x 5.0 cm.  Right total hip
prosthesis.  Left femoral intramedullary nail.
IMPRESSION: Findings consistent with bilateral ovarian cysts.  No free fluid.

## 2010-02-19 ENCOUNTER — Telehealth: Payer: Self-pay | Admitting: Orthopedic Surgery

## 2010-02-26 ENCOUNTER — Emergency Department (HOSPITAL_COMMUNITY): Admission: EM | Admit: 2010-02-26 | Discharge: 2010-02-26 | Payer: Self-pay | Admitting: Emergency Medicine

## 2010-03-05 ENCOUNTER — Telehealth: Payer: Self-pay | Admitting: Orthopedic Surgery

## 2010-03-21 ENCOUNTER — Telehealth: Payer: Self-pay | Admitting: Orthopedic Surgery

## 2010-03-27 ENCOUNTER — Ambulatory Visit (HOSPITAL_COMMUNITY): Admission: RE | Admit: 2010-03-27 | Discharge: 2010-03-27 | Payer: Self-pay | Admitting: Internal Medicine

## 2010-04-05 ENCOUNTER — Ambulatory Visit: Payer: Self-pay | Admitting: Orthopedic Surgery

## 2010-04-05 DIAGNOSIS — M25469 Effusion, unspecified knee: Secondary | ICD-10-CM

## 2010-04-12 ENCOUNTER — Ambulatory Visit: Payer: Self-pay | Admitting: Otolaryngology

## 2010-04-20 ENCOUNTER — Ambulatory Visit (HOSPITAL_COMMUNITY): Admission: RE | Admit: 2010-04-20 | Discharge: 2010-04-20 | Payer: Self-pay | Admitting: Otolaryngology

## 2010-04-26 ENCOUNTER — Ambulatory Visit: Payer: Self-pay | Admitting: Otolaryngology

## 2010-05-24 ENCOUNTER — Ambulatory Visit (HOSPITAL_COMMUNITY): Admission: RE | Admit: 2010-05-24 | Discharge: 2010-05-24 | Payer: Self-pay | Admitting: Otolaryngology

## 2010-05-24 ENCOUNTER — Ambulatory Visit: Payer: Self-pay | Admitting: Otolaryngology

## 2010-05-31 ENCOUNTER — Ambulatory Visit: Payer: Self-pay | Admitting: Otolaryngology

## 2010-05-31 ENCOUNTER — Ambulatory Visit: Payer: Self-pay | Admitting: Orthopedic Surgery

## 2010-05-31 DIAGNOSIS — M542 Cervicalgia: Secondary | ICD-10-CM

## 2010-06-05 ENCOUNTER — Encounter (HOSPITAL_COMMUNITY): Admission: RE | Admit: 2010-06-05 | Discharge: 2010-07-05 | Payer: Self-pay | Admitting: Orthopedic Surgery

## 2010-06-05 ENCOUNTER — Encounter: Payer: Self-pay | Admitting: Orthopedic Surgery

## 2010-06-11 ENCOUNTER — Encounter: Payer: Self-pay | Admitting: Orthopedic Surgery

## 2010-06-14 ENCOUNTER — Encounter: Payer: Self-pay | Admitting: Orthopedic Surgery

## 2010-06-14 ENCOUNTER — Ambulatory Visit: Payer: Self-pay | Admitting: Otolaryngology

## 2010-06-19 ENCOUNTER — Encounter: Payer: Self-pay | Admitting: Orthopedic Surgery

## 2010-07-09 ENCOUNTER — Encounter (HOSPITAL_COMMUNITY): Admission: RE | Admit: 2010-07-09 | Discharge: 2010-08-08 | Payer: Self-pay | Admitting: Orthopedic Surgery

## 2010-07-12 ENCOUNTER — Ambulatory Visit: Payer: Self-pay | Admitting: Orthopedic Surgery

## 2010-07-12 ENCOUNTER — Telehealth: Payer: Self-pay | Admitting: Orthopedic Surgery

## 2010-07-12 DIAGNOSIS — IMO0002 Reserved for concepts with insufficient information to code with codable children: Secondary | ICD-10-CM

## 2010-07-12 DIAGNOSIS — M1710 Unilateral primary osteoarthritis, unspecified knee: Secondary | ICD-10-CM | POA: Insufficient documentation

## 2010-07-16 ENCOUNTER — Telehealth: Payer: Self-pay | Admitting: Orthopedic Surgery

## 2010-07-17 ENCOUNTER — Encounter: Payer: Self-pay | Admitting: Orthopedic Surgery

## 2010-07-25 ENCOUNTER — Telehealth: Payer: Self-pay | Admitting: Orthopedic Surgery

## 2010-07-25 ENCOUNTER — Encounter: Payer: Self-pay | Admitting: Gastroenterology

## 2010-07-26 ENCOUNTER — Encounter: Payer: Self-pay | Admitting: Orthopedic Surgery

## 2010-07-28 HISTORY — PX: COLONOSCOPY: SHX174

## 2010-08-14 ENCOUNTER — Ambulatory Visit (HOSPITAL_COMMUNITY): Admission: RE | Admit: 2010-08-14 | Discharge: 2010-08-14 | Payer: Self-pay | Admitting: Gastroenterology

## 2010-08-14 ENCOUNTER — Ambulatory Visit: Payer: Self-pay | Admitting: Gastroenterology

## 2010-08-16 ENCOUNTER — Telehealth: Payer: Self-pay | Admitting: Orthopedic Surgery

## 2010-08-16 ENCOUNTER — Ambulatory Visit: Payer: Self-pay | Admitting: Otolaryngology

## 2010-08-28 ENCOUNTER — Encounter: Payer: Self-pay | Admitting: Orthopedic Surgery

## 2010-09-03 ENCOUNTER — Emergency Department (HOSPITAL_COMMUNITY): Admission: EM | Admit: 2010-09-03 | Discharge: 2010-09-03 | Payer: Self-pay | Admitting: Emergency Medicine

## 2010-09-13 ENCOUNTER — Ambulatory Visit: Payer: Self-pay | Admitting: Otolaryngology

## 2010-10-16 ENCOUNTER — Ambulatory Visit (HOSPITAL_COMMUNITY)
Admission: RE | Admit: 2010-10-16 | Discharge: 2010-10-16 | Payer: Self-pay | Source: Home / Self Care | Attending: Family Medicine | Admitting: Family Medicine

## 2010-10-25 ENCOUNTER — Ambulatory Visit
Admission: RE | Admit: 2010-10-25 | Discharge: 2010-10-25 | Payer: Self-pay | Source: Home / Self Care | Attending: Otolaryngology | Admitting: Otolaryngology

## 2010-11-06 ENCOUNTER — Ambulatory Visit: Admit: 2010-11-06 | Payer: Self-pay | Admitting: Orthopedic Surgery

## 2010-11-07 ENCOUNTER — Ambulatory Visit (HOSPITAL_COMMUNITY)
Admission: RE | Admit: 2010-11-07 | Discharge: 2010-11-07 | Payer: Self-pay | Source: Home / Self Care | Attending: Otolaryngology | Admitting: Otolaryngology

## 2010-11-15 ENCOUNTER — Ambulatory Visit
Admission: RE | Admit: 2010-11-15 | Discharge: 2010-11-15 | Payer: Self-pay | Source: Home / Self Care | Attending: Otolaryngology | Admitting: Otolaryngology

## 2010-11-18 ENCOUNTER — Encounter: Payer: Self-pay | Admitting: Orthopedic Surgery

## 2010-11-22 ENCOUNTER — Ambulatory Visit
Admission: RE | Admit: 2010-11-22 | Discharge: 2010-11-22 | Payer: Self-pay | Source: Home / Self Care | Attending: Orthopedic Surgery | Admitting: Orthopedic Surgery

## 2010-11-22 ENCOUNTER — Telehealth: Payer: Self-pay | Admitting: Orthopedic Surgery

## 2010-11-27 NOTE — Miscellaneous (Signed)
Summary: PT progress note  PT progress note   Imported By: Jacklynn Ganong 07/17/2010 12:21:14  _____________________________________________________________________  External Attachment:    Type:   Image     Comment:   External Document

## 2010-11-27 NOTE — Letter (Signed)
Summary: TRIAGE ORDER  TRIAGE ORDER   Imported By: Ave Filter 07/25/2010 10:01:55  _____________________________________________________________________  External Attachment:    Type:   Image     Comment:   External Document

## 2010-11-27 NOTE — Progress Notes (Signed)
Summary: call from patient   Phone Note Call from Patient   Caller: Patient Summary of Call: Patient called to relay that her knee is giving her a fit; had her injection done Thursday 9/15.  I reviewed the patient instructions with her. She'll continue to follow and will call back if any further concerns. Initial call taken by: Cammie Sickle,  July 16, 2010 5:35 PM

## 2010-11-27 NOTE — Miscellaneous (Signed)
Summary: Rehab referral note for [facility]  Rehab referral note for [facility]   Imported By: Jacklynn Ganong 06/12/2010 12:00:05  _____________________________________________________________________  External Attachment:    Type:   Image     Comment:   External Document

## 2010-11-27 NOTE — Progress Notes (Signed)
Summary: Call from patient request refill pain medication  Phone Note Call from Patient   Caller: Patient Summary of Call: Patient called to request refill on Percocet Rx. Initial call taken by: Cammie Sickle,  November 08, 2009 1:47 PM    New/Updated Medications: PERCOCET 5-325 MG TABS (OXYCODONE-ACETAMINOPHEN) 1 by mouth q 4 as needed Prescriptions: PERCOCET 5-325 MG TABS (OXYCODONE-ACETAMINOPHEN) 1 by mouth q 4 as needed  #90 x 0   Entered and Authorized by:   Fuller Canada MD   Signed by:   Fuller Canada MD on 11/08/2009   Method used:   Print then Give to Patient   RxID:   [id number] PERCOCET 5-325 MG TABS (OXYCODONE-ACETAMINOPHEN) 1 by mouth q 4 as needed  #90 x 0   Entered and Authorized by:   Fuller Canada MD   Signed by:   Fuller Canada MD on 11/08/2009   Method used:   Print then Give to Patient   RxID:   [id number]

## 2010-11-27 NOTE — Miscellaneous (Signed)
Summary: PT Initial evaluation   PT Initial evaluation   Imported By: Jacklynn Ganong 06/19/2010 11:25:55  _____________________________________________________________________  External Attachment:    Type:   Image     Comment:   External Document

## 2010-11-27 NOTE — Progress Notes (Signed)
Summary: Refill request  Phone Note Call from Patient   Caller: Patient Call For: Dr. Romeo Apple Reason for Call: Refill Medication Summary of Call: Patient wants to pick up her Percocet script today. Initial call taken by: Waldon Reining,  January 23, 2010 10:22 AM    Prescriptions: PERCOCET 5-325 MG TABS (OXYCODONE-ACETAMINOPHEN) 1 by mouth q 4 as needed  #90 x 0   Entered and Authorized by:   Fuller Canada MD   Signed by:   Fuller Canada MD on 01/23/2010   Method used:   Print then Give to Patient   RxID:   [id number]

## 2010-11-27 NOTE — Progress Notes (Signed)
Summary: percocet needed  Phone Note Call from Patient Call back at [facility] Phone [phone]   Summary of Call: wants to pick up Percocet 5 rx in the am, can you print it out and sign it for her thanks Initial call taken by: Chasity Tereasa Coop,  December 20, 2009 2:06 PM
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
## 2010-11-27 NOTE — Miscellaneous (Signed)
Summary: PT Initial evaluation  PT Initial evaluation   Imported By: Jacklynn Ganong 06/19/2010 12:13:48  _____________________________________________________________________  External Attachment:    Type:   Image     Comment:   External Document
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
## 2010-11-27 NOTE — Progress Notes (Signed)
Summary: left knee hurting very badly  Phone Note Call from Patient   Summary of Call: Rachel [name] ([id number]) says her left knee  has been hurting very badly since this past Sunday.  She is using the ice and taking the Hydrocodone without any relief from the pain.  Any other advice?  She uses [place]  Initial call taken by: Jacklynn Ganong,  August 16, 2010 9:31 AM  Follow-up for Phone Call        no further advice Follow-up by: Fuller Canada MD,  August 16, 2010 11:49 AM  Additional Follow-up for Phone Call Additional follow up Details #1::        left a message to call the office Additional Follow-up by: Jacklynn Ganong,  August 16, 2010 12:07 PM    Additional Follow-up for Phone Call Additional follow up Details #2::    patient call back, I advised of doctor' sreply

## 2010-11-27 NOTE — Letter (Signed)
Summary: Temporary handicapp sticker  Temporary handicapp sticker   Imported By: Jacklynn Ganong 08/28/2010 11:21:21  _____________________________________________________________________  External Attachment:    Type:   Image     Comment:   External Document

## 2010-11-27 NOTE — Progress Notes (Signed)
Summary: knee still hurting badly  Phone Note Call from Patient   Summary of Call: Rachel [name] says her knee is still hurting very badly .  Has not stopped hurting since the injection 07/12/10.  Sometimes cannot stand to walk on it.  The pain medicine helps some.  Wanted to know if you have any other advice. Her # [phone] Initial call taken by: Jacklynn Ganong,  July 25, 2010 10:33 AM  Follow-up for Phone Call        take the pain medication as ordered  use ice 20 min 4 x a day  Follow-up by: Fuller Canada MD,  July 25, 2010 10:40 AM  Additional Follow-up for Phone Call Additional follow up Details #1::        Advised the patient of doctor's reply Additional Follow-up by: Jacklynn Ganong,  July 25, 2010 10:44 AM

## 2010-11-27 NOTE — Miscellaneous (Signed)
Summary: OT Progress note  OT Progress note   Imported By: Jacklynn Ganong 11/13/2009 14:27:45  _____________________________________________________________________  External Attachment:    Type:   Image     Comment:   External Document

## 2010-11-27 NOTE — Assessment & Plan Note (Signed)
Summary: 3 M RE-CHECK RT SHOULDER/POST OP 07/04/09/MEDICAID/CAF   Visit Type:  Follow-up Referring Provider:  same Primary Provider:  Franchot Heidelberg, MD  CC:  right shoulder pain.  History of Present Illness: 55 year old female presents with RIGHT shoulder pain after open cuff repair September 2010 also did standard acromioplasty excision distal clavicle.  Complains of weakness and stiffness in the RIGHT shoulder.  She was noncompliant with rehabilitation.   DOS 07/04/09  Procedure  Open right rotator cuff repair, excision of the distal clavicle with standard acromioplasty.  Medication:  Percocet 7.5,  and Gabapentin.    Examination reveals that there is some swelling over the RIGHT shoulder tenderness at the anterolateral acromial edge and also the lateral deltoid there appears to be a sulcus sign.  She has passive range of motion of 120 of flexion and 40 of external rotation.  She does not have good supraspinatus strength.  I did inject the subacromial space.  Recommend arthrogram to rule out recurrent cuff tear.    Allergies: 1)  ! Lisinopril 2)  * Prednisone   Impression & Recommendations:  Problem # 1:  AFTERCARE FOLLOW SURGERY MUSCULOSKEL SYSTEM NEC (ICD-V58.78) Assessment Comment Only  Orders: T-BUN ([id number]) T- * Misc. Laboratory test [phone]) Est. Patient Level III (91478)  Problem # 2:  RUPTURE ROTATOR CUFF (ICD-727.61) Assessment: Comment Only poor function after rotator cuff repair patient was noncompliant recommend further imaging.  Situation has deteriorated. Orders: T-BUN ([id number]) T- * Misc. Laboratory test [phone]) Est. Patient Level III (96295) Depo- Medrol 40mg  (J1030) Joint Aspirate / Injection, Large (20610)  Patient Instructions: 1)  arthrogram rt shoulder/f/u in 2 weeks 2)  You have received an injection of cortisone today. You may experience increased pain at the injection site. Apply ice pack to the area for 20 minutes every 2  hours and take 2 xtra strength tylenol every 8 hours. This increased pain will usually resolve in 24 hours. The injection will take effect in 3-10 days.

## 2010-11-27 NOTE — Letter (Signed)
[facility]   Imported By: Cammie Sickle 02/22/2010 19:22:02  _____________________________________________________________________  External Attachment:    Type:   Image     Comment:   External Document

## 2010-11-27 NOTE — Miscellaneous (Signed)
Summary: PT initial evaluation  PT initial evaluation   Imported By: Jacklynn Ganong 06/11/2010 10:20:41  _____________________________________________________________________  External Attachment:    Type:   Image     Comment:   External Document

## 2010-11-27 NOTE — Progress Notes (Signed)
Summary: ESI has not helped  Phone Note Call from Patient   Summary of Call: Rachel [name] wanted you to know that she has first [facility] 01/25/09 and so far it has not helped. Her # [phone] Initial call taken by: Jacklynn Ganong,  January 31, 2009 1:00 PM  Follow-up for Phone Call        GO AHEAD AND GET THE 2ND  Follow-up by: Fuller Canada MD,  January 31, 2009 1:07 PM  Additional Follow-up for Phone Call Additional follow up Details #1::        Patient advised Additional Follow-up by: Jacklynn Ganong,  January 31, 2009 1:41 PM
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
## 2010-11-27 NOTE — Progress Notes (Signed)
Summary: wants refill on Percocet  Phone Note Call from Patient Call back at Home Phone [phone]   Summary of Call: wants refill on Percocet to pick up tomorrow Initial call taken by: Chasity Tereasa Coop,  November 22, 2009 3:44 PM    New/Updated Medications: PERCOCET 5-325 MG TABS (OXYCODONE-ACETAMINOPHEN)  Prescriptions: PERCOCET 5-325 MG TABS (OXYCODONE-ACETAMINOPHEN)   #90 x 0   Entered and Authorized by:   Fuller Canada MD   Signed by:   Fuller Canada MD on 11/23/2009   Method used:   Print then Give to Patient   RxID:   [id number] PERCOCET 5-325 MG TABS (OXYCODONE-ACETAMINOPHEN) 1 by mouth q 4 as needed  #90 x 0   Entered and Authorized by:   Fuller Canada MD   Signed by:   Fuller Canada MD on 11/23/2009   Method used:   Print then Give to Patient   RxID:   [id number]

## 2010-11-27 NOTE — Miscellaneous (Signed)
  Clinical Lists Changes  Medications: Rx of PERCOCET 5-325 MG TABS (OXYCODONE-ACETAMINOPHEN) 1 by mouth q 4 as needed;  #90 x 0;  Signed;  Entered by: Fuller Canada MD;  Authorized by: Fuller Canada MD;  Method used: Print then Give to Patient    Prescriptions: PERCOCET 5-325 MG TABS (OXYCODONE-ACETAMINOPHEN) 1 by mouth q 4 as needed  #90 x 0   Entered and Authorized by:   Fuller Canada MD   Signed by:   Fuller Canada MD on 12/21/2009   Method used:   Print then Give to Patient   RxID:   [id number]

## 2010-11-27 NOTE — Progress Notes (Signed)
Summary: patient called request med refill + to pick up sticker  Phone Note Call from Patient   Caller: Patient Summary of Call: Patient called to  (1) relay she will be in to pick up handicapped sticker as discussed                           (2) request refill on pain medication; asking if she can pick this up today Ph # [phone] Initial call taken by: Cammie Sickle,  February 19, 2010 9:56 AM  Follow-up for Phone Call        yes Follow-up by: Ether Griffins,  February 19, 2010 10:14 AM    Prescriptions: PERCOCET 5-325 MG TABS (OXYCODONE-ACETAMINOPHEN) 1 by mouth q 4 as needed  #90 x 0   Entered and Authorized by:   Fuller Canada MD   Signed by:   Fuller Canada MD on 02/19/2010   Method used:   Print then Give to Patient   RxID:   [id number]

## 2010-11-27 NOTE — Miscellaneous (Signed)
Summary: OT progress note  OT progress note   Imported By: Jacklynn Ganong 12/01/2009 08:58:33  _____________________________________________________________________  External Attachment:    Type:   Image     Comment:   External Document

## 2010-11-27 NOTE — Miscellaneous (Signed)
Summary: OT Progress note  OT Progress note   Imported By: Jacklynn Ganong 11/03/2009 08:22:17  _____________________________________________________________________  External Attachment:    Type:   Image     Comment:   External Document

## 2010-11-27 NOTE — Letter (Signed)
Summary: Prior approval for tens unit  Prior approval for tens unit   Imported By: Jacklynn Ganong 07/26/2010 16:26:06  _____________________________________________________________________  External Attachment:    Type:   Image     Comment:   External Document

## 2010-11-27 NOTE — Progress Notes (Signed)
Summary: Neurontin 300 mg  Phone Note Call from Patient   Summary of Call: Patient called in and states [facility] doesn't have her on 300 mg of Neurontin, can you please send in  a prescription for this.  Thanks Initial call taken by: Curtis Sites,  July 12, 2010 10:09 AM    Prescriptions: NEURONTIN 300 MG CAPS (GABAPENTIN) One three times a day per Ortho  #90 x 1   Entered by:   Ether Griffins   Authorized by:   Fuller Canada MD   Signed by:   Ether Griffins on 07/12/2010   Method used:   Faxed to ...       [facility]* (retail)       [address]       [place], [place]  [address]       Ph: [phone]       Fax: [phone]   RxID:   [id number]

## 2010-11-27 NOTE — Assessment & Plan Note (Signed)
Summary: ARTHOGRAM RESULTS/MEDICAID/BSF   Visit Type:  Follow-up Referring Provider:  same Primary Provider:  Franchot Heidelberg, MD  CC:  right shoulder pain.  History of Present Illness: status post RIGHT rotator cuff repair and distal clavicle in September 2010 complaint is stiffness and continued pain RIGHT shoulder  She had arthrogram but it was unsuccessful in terms of assessing rotator cuff.  She does note that her shoulder feels better, probably from the fluid breaking up some adhesions  Her passive motion is 90 flexion 90 of abduction 45 external rotation.  Has pain with forward elevation past 90  she is on Percocet 5 mg to 25 of Tylenol  He wanted to have 180 tablets  I told her we could only give her 90 and I would start weaning her off Percocet.  Next prescription will be for Lorcet 10 mg with 325 Tylenol.  Allergies: 1)  ! Lisinopril 2)  * Prednisone   Impression & Recommendations:  Problem # 1:  RUPTURE ROTATOR CUFF (ICD-727.61) Assessment Comment Only  Orders: Est. Patient Level II ([address])  Patient Instructions: 1)  Please schedule a follow-up appointment in 2 months. Prescriptions: PERCOCET 5-325 MG TABS (OXYCODONE-ACETAMINOPHEN) 1 by mouth q 4 as needed  #90 x 0   Entered and Authorized by:   Fuller Canada MD   Signed by:   Fuller Canada MD on 02/01/2010   Method used:   Print then Give to Patient   RxID:   [id number]

## 2010-11-27 NOTE — Progress Notes (Signed)
Summary: Med refill  Phone Note Call from Patient   Caller: Patient Call For: Dr. Romeo Apple Reason for Call: Refill Medication Summary of Call: She wants a refill on her Percocet. Initial call taken by: Waldon Reining,  Mar 05, 2010 8:15 AM  Follow-up for Phone Call        med change  Follow-up by: Fuller Canada MD,  Mar 05, 2010 8:45 AM    New/Updated Medications: NORCO 10-325 MG TABS (HYDROCODONE-ACETAMINOPHEN) 1 by mouth q 4 as needed pain Prescriptions: NORCO 10-325 MG TABS (HYDROCODONE-ACETAMINOPHEN) 1 by mouth q 4 as needed pain  #90 x 5   Entered and Authorized by:   Fuller Canada MD   Signed by:   Fuller Canada MD on 03/05/2010   Method used:   Print then Give to Patient   RxID:   [id number]

## 2010-11-27 NOTE — Progress Notes (Signed)
Summary: wants new Percocet prescription  Phone Note Call from Patient   Summary of Call: Rachel [name] ([birth year]) wants a new prescription for Percocet.  Also ask if you would write for 30 days instead of 15. Her # [phone] Initial call taken by: Jacklynn Ganong,  January 08, 2010 9:41 AM  Follow-up for Phone Call        no 15 only   p/u Rx  Follow-up by: Fuller Canada MD,  January 08, 2010 9:47 AM  Additional Follow-up for Phone Call Additional follow up Details #1::        Patient picked up prescription Additional Follow-up by: Jacklynn Ganong,  January 08, 2010 11:42 AM    Prescriptions: PERCOCET 5-325 MG TABS (OXYCODONE-ACETAMINOPHEN) 1 by mouth q 4 as needed  #90 x 0   Entered and Authorized by:   Fuller Canada MD   Signed by:   Fuller Canada MD on 01/08/2010   Method used:   Print then Give to Patient   RxID:   [id number]

## 2010-11-27 NOTE — Letter (Signed)
Summary: CMN from [facility]  CMN from [facility]   Imported By: Jacklynn Ganong 06/19/2010 12:20:31  _____________________________________________________________________  External Attachment:    Type:   Image     Comment:   External Document

## 2010-11-27 NOTE — Progress Notes (Signed)
Summary: wants order for knee brace  Phone Note Call from Patient   Summary of Call: Evelyne Makepeace (2055/12/21) wants order for a knee brace faxed to [facility].  Says she needs this for support of her left knee. Her # [phone] Initial call taken by: Jacklynn Ganong,  Mar 21, 2010 12:03 PM  Follow-up for Phone Call        ok i will do it  Follow-up by: Fuller Canada MD,  Mar 21, 2010 12:10 PM  Additional Follow-up for Phone Call Additional follow up Details #1::        ok Additional Follow-up by: Ether Griffins,  Mar 21, 2010 12:27 PM    New/Updated Medications: KNEE BRACE/HINGED BARS LARGE  MISC (ELASTIC BANDAGES & SUPPORTS) needs 1 knee brace open hinges Prescriptions: KNEE BRACE/HINGED BARS LARGE  MISC (ELASTIC BANDAGES & SUPPORTS) needs 1 knee brace open hinges  #1 x 0   Entered by:   Ether Griffins   Authorized by:   Fuller Canada MD   Signed by:   Ether Griffins on 03/21/2010   Method used:   Faxed to ...       [facility] (retail)       [address]       [place], [place]  [address]       Ph: [phone]       Fax: [phone]   RxID:   [id number]

## 2010-11-29 NOTE — Assessment & Plan Note (Signed)
Summary: KNEE/SHOULDER INJECTION/MCD/BSF   Visit Type:  Follow-up Referring Provider:  same Primary Provider:  Franchot Heidelberg, MD  CC:  left knee and right shoulder.Marland Kitchen  History of Present Illness: I saw Rachel [name] in the office today for a followup visit.  She is a right-handed 55 years old woman with the complaint of:  left knee and right shoulder  Status post intramedullary nail LEFT femur Status post rotator cuff repair RIGHT shoulder distal clavicle excision  MEDS: HYDROCODONE 10 MG / 325 MG   Last seen on 07-12-10 for her left knee and received an injection.  Last seen on 05-31-10 for her right shoulder and received an injection.  Complaints: She says that she has had pain for 1 month.  She complains of pain in the LEFT knee and RIGHT shoulder she has a LEFT femoral nail and also complains of LEFT hip pain.  She's had x-rays that show her hip joint does not have any arthritis.  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  She also has chronic pain.  Should her RIGHT rotator cuff repair.  She was a poor participant in rehabilitation and developed a stiff shoulder.  Allergies: 1)  ! Lisinopril 2)  * Prednisone  Physical Exam  Additional Exam:  LEFT knee no joint effusion is seen today.  He has tenderness over the medial compartment.  She has normal strength in the LEFT lower extremity.  She has painless range of motion LEFT hip.  there is 120 of flexion in the LEFT knee.  She is painful for no elevation of the RIGHT shoulder with tenderness globally.  She has a positive impingement sign and some adhesive capsulitis with decreased extension external rotation and forward elevation.  Just normal range of motion in her wrist with normal sensation in the RIGHT hand.  No sensory deficits are seen in the LEFT lower extremity.  And she has a normal pulse and the radial artery on the RIGHT and the dorsalis pedis pulse on the LEFT   Impression &  Recommendations:  Problem # 1:  ARTHRITIS, LEFT KNEE (ICD-716.96) Assessment Deteriorated  Orders: Est. Patient Level III ([address]) Joint Aspirate / Injection, Large ([address]) Depo- Medrol 40mg  (J1030) Verbal consent was obtained. The left knee was prepped with alcohol and ethyl chloride. 1 cc of depomedrol 40mg /cc and 4 cc of lidocaine 1% was injected. there were no complications.  Problem # 2:  RUPTURE ROTATOR CUFF (ICD-727.61) Assessment: Deteriorated  Orders: Est. Patient Level III ([address]) Joint Aspirate / Injection, Large ([address]) Depo- Medrol 40mg  (J1030) Verbal consent obtained/The right shoulder was injected with depomedrol 40mg /cc and sensorcaine .25% . There were no complications  Other Orders: Misc. Referral (Misc. Ref)  Patient Instructions: 1)  refer to [facility] for eval of femoral nail and left hip pain  2)  You have received an injection of cortisone today. You may experience increased pain at the injection site. Apply ice pack to the area for 20 minutes every 2 hours and take 2 xtra strength tylenol every 8 hours. This increased pain will usually resolve in 24 hours. The injection will take effect in 3-10 days.     Orders Added: 1)  Misc. Referral [Misc. Ref] 2)  Est. Patient Level III [[address]] 3)  Joint Aspirate / Injection, Large [[address]] 4)  Depo- Medrol 40mg  [J1030]

## 2010-11-29 NOTE — Progress Notes (Signed)
Summary: prednisone  Phone Note Call from Patient Call back at [facility] Phone [phone]   Summary of Call: wants rx for prednisone, ok or not Initial call taken by: Ether Griffins,  November 22, 2010 11:41 AM  Follow-up for Phone Call        ok Follow-up by: Fuller Canada MD,  November 22, 2010 11:49 AM    New/Updated Medications: PREDNISONE (PAK) 10 MG TABS (PREDNISONE) as directed for 12 days Prescriptions: PREDNISONE (PAK) 10 MG TABS (PREDNISONE) as directed for 12 days  #1 x 0   Entered by:   Ether Griffins   Authorized by:   Fuller Canada MD   Signed by:   Ether Griffins on 11/22/2010   Method used:   Faxed to ...       [facility] (retail)       [address]       [place], [place]  [address]       Ph: [phone]       Fax: [phone]   RxID:   [id number]

## 2010-12-06 ENCOUNTER — Other Ambulatory Visit (INDEPENDENT_AMBULATORY_CARE_PROVIDER_SITE_OTHER): Payer: Self-pay | Admitting: Otolaryngology

## 2010-12-06 ENCOUNTER — Encounter (HOSPITAL_COMMUNITY): Payer: Medicaid Other | Attending: Otolaryngology

## 2010-12-06 DIAGNOSIS — Z01812 Encounter for preprocedural laboratory examination: Secondary | ICD-10-CM | POA: Insufficient documentation

## 2010-12-06 LAB — BASIC METABOLIC PANEL
BUN: 13 mg/dL (ref 6–23)
CO2: 23 mEq/L (ref 19–32)
Chloride: 104 mEq/L (ref 96–112)
GFR calc non Af Amer: >60 mL/min (ref >60)
Glucose, Bld: 76 mg/dL (ref 70–99)
Potassium: 3.8 mEq/L (ref 3.5–5.1)

## 2010-12-06 LAB — HEMOGLOBIN AND HEMATOCRIT, BLOOD: Hemoglobin: 12.8 g/dL (ref 12.0–15.0)

## 2010-12-13 ENCOUNTER — Ambulatory Visit (HOSPITAL_COMMUNITY)
Admission: RE | Admit: 2010-12-13 | Discharge: 2010-12-13 | Disposition: A | Discharge status: Home / Self Care | Payer: Medicaid Other | Source: Ambulatory Visit | Attending: Otolaryngology | Admitting: Otolaryngology

## 2010-12-13 ENCOUNTER — Telehealth: Payer: Self-pay | Admitting: Orthopedic Surgery

## 2010-12-13 ENCOUNTER — Other Ambulatory Visit (INDEPENDENT_AMBULATORY_CARE_PROVIDER_SITE_OTHER): Payer: Self-pay | Admitting: Otolaryngology

## 2010-12-13 DIAGNOSIS — J343 Hypertrophy of nasal turbinates: Secondary | ICD-10-CM | POA: Insufficient documentation

## 2010-12-13 DIAGNOSIS — Z79899 Other long term (current) drug therapy: Secondary | ICD-10-CM | POA: Insufficient documentation

## 2010-12-13 DIAGNOSIS — J320 Chronic maxillary sinusitis: Secondary | ICD-10-CM

## 2010-12-13 DIAGNOSIS — I10 Essential (primary) hypertension: Secondary | ICD-10-CM | POA: Insufficient documentation

## 2010-12-13 DIAGNOSIS — J338 Other polyp of sinus: Secondary | ICD-10-CM | POA: Insufficient documentation

## 2010-12-13 DIAGNOSIS — E785 Hyperlipidemia, unspecified: Secondary | ICD-10-CM | POA: Insufficient documentation

## 2010-12-13 DIAGNOSIS — J330 Polyp of nasal cavity: Secondary | ICD-10-CM

## 2010-12-19 NOTE — Progress Notes (Signed)
Summary: Referral to [facility] for her left hip.  Phone Note Outgoing Call   Call placed by: Waldon Reining,  December 13, 2010 2:45 PM Call placed to: Specialist Action Taken: Information Sent Summary of Call: I faxed a referral for this patient to [facility] to be seen for left hip pain.

## 2010-12-19 NOTE — Op Note (Signed)
NAMECINDA, Rachel [name]           ACCOUNT NO.:  [id number]  MEDICAL RECORD NO.:  [id number]           PATIENT TYPE:  O  LOCATION:  DAYP                          FACILITY:  [facility]  PHYSICIAN:  Newman Pies, MD            DATE OF BIRTH:  01/22/1956  DATE OF PROCEDURE:  12/13/2010 DATE OF DISCHARGE:                              OPERATIVE REPORT   SURGEON:  Newman Pies, MD  PREOPERATIVE DIAGNOSES: 1. Left inferior turbinate hypertrophy. 2. Chronic nasal obstruction. 3. Right nasal polyposis and chronic rhinosinusitis.  POSTOPERATIVE DIAGNOSES: 1. Left inferior turbinate hypertrophy. 2. Chronic nasal obstruction. 3. Right nasal polyposis and chronic rhinosinusitis.  PROCEDURES PERFORMED: 1. Right endoscopic nasal debridement and polypectomy. 2. Left partial inferior turbinate resection.  ANESTHESIA:  General endotracheal tube anesthesia.  COMPLICATIONS:  None.  ESTIMATED BLOOD LOSS:  200 mL.  INDICATIONS FOR THE PROCEDURE:  The patient is a 55 year old female with a history of chronic nasal obstruction and rhinosinusitis.  She also previously had a history of a right nasal mass, status post endoscopic resection.  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  Over the past several months, the patient has been complaining of increasing nasal obstruction, especially on the left side.  In addition, the patient also complains of significant crusting and pressure and pain over the right midface.  On examination, she was noted to have a severe left inferior turbinate hypertrophy and accumulation of crusting and polyp around the right maxillary antrum.  Based on the above findings, the decision was made for the patient to undergo the above-stated procedures.  The risks, benefits, alternatives, and details of the procedure were discussed with the patient.  Questions were invited and answered.  Informed consent was obtained.  DESCRIPTION:  The patient was taken to the operating room and  placed supine on the operating room table.  General endotracheal tube anesthesia was administered by the anesthesiologist.  The patient was positioned and prepped and draped in standard fashion for nasal surgery. Pledgets soaked with Afrin were placed in both nasal cavities for vasoconstriction.  The pledgets were subsequently removed.  Endoscopic evaluation of the nasal cavities revealed severe left inferior turbinate hypertrophy and polypoid mucosa around the right maxillary antrum.  A moderate amount of crusting was also noted around the same area.  Attention was first focused on the left side.  The inferior one half of the left inferior turbinate was crossclamped with a pair of straight Kelly clamp.  The inferior one half of the inferior turbinate was resected with a pair of crosscutting scissors.  Hemostasis was achieved with suction and electrocautery device.  Attention was then focused on the right side.  Under endoscopic guidance, the polypoid tissue around the right maxillary antrum was removed.  The specimen was sent to the pathology department for permanent histologic identification.  The maxillary antrum was then carefully enlarged.  A significant amount of crusting was removed. Hemostasis was achieved with pledgets soaked with Afrin.  That concluded the procedure for the patient.  Nasopore packing was placed.  The care of the patient was turned over to the anesthesiologist.  The patient was awakened from anesthesia without difficulty.  She was extubated and transferred to the recovery room in good condition.  OPERATIVE FINDINGS: 1. Left inferior turbinate hypertrophy. 2. Right nasal polyposis and crusting.  SPECIMEN:  Right nasal polyps.  FOLLOWUP CARE:  The patient will be discharged home once she is awake and alert.  She will be placed on Vicodin 1-2 tablets p.o. q.4-6 h. p.r.n. pain (she was instructed not to take any other narcotics while on Vicodin), and  amoxicillin 500 mg p.o. t.i.d. for 5 days.  The patient will follow up in my office in approximately 1 week.     Newman Pies, MD     ST/MEDQ  D:  12/13/2010  T:  12/14/2010  Job:  [id number]  Electronically Signed by Newman Pies MD on 12/19/2010 11:33:58 AM

## 2010-12-20 ENCOUNTER — Ambulatory Visit (INDEPENDENT_AMBULATORY_CARE_PROVIDER_SITE_OTHER): Payer: Medicaid Other | Admitting: Otolaryngology

## 2010-12-26 ENCOUNTER — Telehealth: Payer: Self-pay | Admitting: Orthopedic Surgery

## 2011-01-03 NOTE — Progress Notes (Signed)
Summary: [facility] appointment scheduled for LT hip  Phone Note From Other Clinic   Caller: Referral Coordinator Summary of Call: Nedra Hai from [facility] orthopedic called with appointment for Rachel [name]: Wed, January 09, 2011, 9:00 am, with Dr.Jordan Case.  Their office will call patient with the appointment.  I also called patient to notify.   ( Note:  Nedra Hai asked about previous history of the Lt femoral nailing and approximate date of onset of pain. She will pursue medical records there at [facility] from initial treatment and will be calling patient also for information.) Initial call taken by: Cammie Sickle,  December 26, 2010 10:28 AM

## 2011-01-10 ENCOUNTER — Ambulatory Visit (INDEPENDENT_AMBULATORY_CARE_PROVIDER_SITE_OTHER): Payer: Medicaid Other | Admitting: Otolaryngology

## 2011-01-10 DIAGNOSIS — J320 Chronic maxillary sinusitis: Secondary | ICD-10-CM

## 2011-01-12 LAB — BASIC METABOLIC PANEL
CO2: 27 mEq/L (ref 19–32)
Calcium: 9.2 mg/dL (ref 8.4–10.5)
Creatinine, Ser: 0.73 mg/dL (ref 0.4–1.2)
GFR calc Af Amer: >60 mL/min (ref >60)
GFR calc non Af Amer: >60 mL/min (ref >60)
Sodium: 139 mEq/L (ref 135–145)

## 2011-01-12 LAB — HEMOGLOBIN AND HEMATOCRIT, BLOOD: HCT: 34.1 % — ABNORMAL LOW (ref 36.0–46.0)

## 2011-01-12 LAB — SURGICAL PCR SCREEN: Staphylococcus aureus: POSITIVE — AB

## 2011-01-15 LAB — URINE MICROSCOPIC-ADD ON

## 2011-01-15 LAB — URINALYSIS, ROUTINE W REFLEX MICROSCOPIC
Bilirubin Urine: NEGATIVE
Glucose, UA: NEGATIVE mg/dL
Protein, ur: 30 mg/dL — AB

## 2011-02-01 LAB — URINALYSIS, ROUTINE W REFLEX MICROSCOPIC
Bilirubin Urine: NEGATIVE
Glucose, UA: NEGATIVE mg/dL
Leukocytes, UA: NEGATIVE
Nitrite: NEGATIVE
Specific Gravity, Urine: 1.025 (ref 1.005–1.030)
Specific Gravity, Urine: 1.025 (ref 1.005–1.030)
Urobilinogen, UA: 0.2 mg/dL (ref 0.0–1.0)
pH: 6 (ref 5.0–8.0)

## 2011-02-01 LAB — DIFFERENTIAL
Basophils Absolute: 0 10*3/uL (ref 0.0–0.1)
Eosinophils Relative: 6 % — ABNORMAL HIGH (ref 0–5)
Lymphocytes Relative: 38 % (ref 12–46)
Neutro Abs: 1.9 10*3/uL (ref 1.7–7.7)
Neutrophils Relative %: 45 % (ref 43–77)

## 2011-02-01 LAB — CBC
HCT: 36.9 % (ref 36.0–46.0)
Hemoglobin: 13.1 g/dL (ref 12.0–15.0)
Platelets: 265 10*3/uL (ref 150–400)
RDW: 12.7 % (ref 11.5–15.5)
RDW: 12.6 % (ref 11.5–15.5)
WBC: 4.3 10*3/uL (ref 4.0–10.5)

## 2011-02-01 LAB — BASIC METABOLIC PANEL
BUN: 12 mg/dL (ref 6–23)
Calcium: 9.6 mg/dL (ref 8.4–10.5)
GFR calc non Af Amer: >60 mL/min (ref >60)
GFR calc non Af Amer: >60 mL/min (ref >60)
Glucose, Bld: 105 mg/dL — ABNORMAL HIGH (ref 70–99)
Glucose, Bld: 113 mg/dL — ABNORMAL HIGH (ref 70–99)
Potassium: 3.8 mEq/L (ref 3.5–5.1)
Sodium: 138 mEq/L (ref 135–145)

## 2011-02-01 LAB — URINE MICROSCOPIC-ADD ON

## 2011-02-01 LAB — HEPATIC FUNCTION PANEL
ALT: 13 U/L (ref 0–35)
AST: 18 U/L (ref 0–37)
Albumin: 4 g/dL (ref 3.5–5.2)
Indirect Bilirubin: 0.4 mg/dL (ref 0.3–0.9)
Total Protein: 7.6 g/dL (ref 6.0–8.3)

## 2011-02-08 ENCOUNTER — Encounter: Payer: Self-pay | Admitting: Orthopedic Surgery

## 2011-02-11 ENCOUNTER — Other Ambulatory Visit: Payer: Self-pay | Admitting: *Deleted

## 2011-02-11 DIAGNOSIS — R52 Pain, unspecified: Secondary | ICD-10-CM

## 2011-02-11 LAB — CBC
HCT: 29 % — ABNORMAL LOW (ref 36.0–46.0)
HCT: 30 % — ABNORMAL LOW (ref 36.0–46.0)
HCT: 37.7 % (ref 36.0–46.0)
Hemoglobin: 12.7 g/dL (ref 12.0–15.0)
Hemoglobin: 8.5 g/dL — ABNORMAL LOW (ref 12.0–15.0)
Hemoglobin: 9.3 g/dL — ABNORMAL LOW (ref 12.0–15.0)
MCHC: 33.7 g/dL (ref 30.0–36.0)
MCHC: 34.4 g/dL (ref 30.0–36.0)
MCHC: 34.6 g/dL (ref 30.0–36.0)
MCHC: 35.5 g/dL (ref 30.0–36.0)
MCV: 86.7 fL (ref 78.0–100.0)
MCV: 87.4 fL (ref 78.0–100.0)
MCV: 89.1 fL (ref 78.0–100.0)
Platelets: 149 10*3/uL — ABNORMAL LOW (ref 150–400)
Platelets: 181 10*3/uL (ref 150–400)
RBC: 3.03 MIL/uL — ABNORMAL LOW (ref 3.87–5.11)
RBC: 3.32 MIL/uL — ABNORMAL LOW (ref 3.87–5.11)
RDW: 13 % (ref 11.5–15.5)
RDW: 13.5 % (ref 11.5–15.5)
WBC: 11.1 10*3/uL — ABNORMAL HIGH (ref 4.0–10.5)

## 2011-02-11 LAB — BASIC METABOLIC PANEL
BUN: 18 mg/dL (ref 6–23)
BUN: 21 mg/dL (ref 6–23)
CO2: 24 mEq/L (ref 19–32)
CO2: 26 mEq/L (ref 19–32)
CO2: 29 mEq/L (ref 19–32)
Calcium: 8.5 mg/dL (ref 8.4–10.5)
Chloride: 103 mEq/L (ref 96–112)
Chloride: 103 mEq/L (ref 96–112)
Chloride: 103 mEq/L (ref 96–112)
Chloride: 103 mEq/L (ref 96–112)
Creatinine, Ser: 1.31 mg/dL — ABNORMAL HIGH (ref 0.4–1.2)
Creatinine, Ser: 1.78 mg/dL — ABNORMAL HIGH (ref 0.4–1.2)
GFR calc Af Amer: >60 mL/min (ref >60)
GFR calc Af Amer: >60 mL/min (ref >60)
GFR calc non Af Amer: 42 mL/min — ABNORMAL LOW (ref >60)
GFR calc non Af Amer: 59 mL/min — ABNORMAL LOW (ref >60)
Glucose, Bld: 108 mg/dL — ABNORMAL HIGH (ref 70–99)
Glucose, Bld: 150 mg/dL — ABNORMAL HIGH (ref 70–99)
Potassium: 3.7 mEq/L (ref 3.5–5.1)
Potassium: 4.1 mEq/L (ref 3.5–5.1)
Potassium: 4.2 mEq/L (ref 3.5–5.1)
Sodium: 133 mEq/L — ABNORMAL LOW (ref 135–145)
Sodium: 135 mEq/L (ref 135–145)
Sodium: 136 mEq/L (ref 135–145)
Sodium: 137 mEq/L (ref 135–145)

## 2011-02-11 LAB — DIFFERENTIAL
Basophils Absolute: 0 10*3/uL (ref 0.0–0.1)
Basophils Relative: 0 % (ref 0–1)
Basophils Relative: 0 % (ref 0–1)
Basophils Relative: 0 % (ref 0–1)
Eosinophils Absolute: 0 10*3/uL (ref 0.0–0.7)
Eosinophils Absolute: 0.7 10*3/uL (ref 0.0–0.7)
Eosinophils Relative: 0 % (ref 0–5)
Lymphocytes Relative: 17 % (ref 12–46)
Lymphocytes Relative: 20 % (ref 12–46)
Lymphs Abs: 2.2 10*3/uL (ref 0.7–4.0)
Monocytes Absolute: 0.9 10*3/uL (ref 0.1–1.0)
Monocytes Relative: 11 % (ref 3–12)
Monocytes Relative: 12 % (ref 3–12)
Neutro Abs: 8.9 10*3/uL — ABNORMAL HIGH (ref 1.7–7.7)
Neutrophils Relative %: 67 % (ref 43–77)
Neutrophils Relative %: 68 % (ref 43–77)
Neutrophils Relative %: 77 % (ref 43–77)

## 2011-02-11 LAB — CROSSMATCH

## 2011-02-11 MED ORDER — HYDROCODONE-ACETAMINOPHEN 10-325 MG PO TABS: 1.0000 | ORAL_TABLET | ORAL | Status: DC | PRN

## 2011-02-12 LAB — CBC
MCHC: 35.2 g/dL (ref 30.0–36.0)
Platelets: 245 10*3/uL (ref 150–400)
RBC: 3.45 MIL/uL — ABNORMAL LOW (ref 3.87–5.11)
WBC: 9.6 10*3/uL (ref 4.0–10.5)

## 2011-02-12 LAB — DIFFERENTIAL
Basophils Absolute: 0 10*3/uL (ref 0.0–0.1)
Basophils Relative: 0 % (ref 0–1)
Monocytes Relative: 9 % (ref 3–12)
Neutro Abs: 5.7 10*3/uL (ref 1.7–7.7)
Neutrophils Relative %: 59 % (ref 43–77)

## 2011-02-14 ENCOUNTER — Telehealth: Payer: Self-pay | Admitting: *Deleted

## 2011-02-14 NOTE — Telephone Encounter (Signed)
States her knee and hip are about to kill her she is taking Norco 10/325 for pain one at a time and Ibuprofen 800mg  tid, she also says she goes to [facility] for water therapy for her hip, she wants to know if you can give her something different for pain?

## 2011-02-14 NOTE — Telephone Encounter (Signed)
Currently taking Norco 10/325 for pain along with Ibuprofen 800mg , she is in a lot of pain, she has appt scheduled here for 02/20/11 for her knee pain, I advised her if she was in excruciating pain to go to PCP or er that you would not receive this message until Monday afternoon, she wants to know if she can have something different for her pain, these 2 meds are not helping?

## 2011-02-17 NOTE — Telephone Encounter (Signed)
No we can not  

## 2011-02-19 ENCOUNTER — Ambulatory Visit: Payer: Medicaid Other | Admitting: Orthopedic Surgery

## 2011-02-20 ENCOUNTER — Ambulatory Visit: Payer: Medicaid Other | Admitting: Orthopedic Surgery

## 2011-02-28 ENCOUNTER — Encounter: Payer: Self-pay | Admitting: Orthopedic Surgery

## 2011-02-28 ENCOUNTER — Ambulatory Visit (INDEPENDENT_AMBULATORY_CARE_PROVIDER_SITE_OTHER): Payer: Medicaid Other | Admitting: Orthopedic Surgery

## 2011-02-28 DIAGNOSIS — M25569 Pain in unspecified knee: Secondary | ICD-10-CM

## 2011-02-28 DIAGNOSIS — G8929 Other chronic pain: Secondary | ICD-10-CM

## 2011-02-28 DIAGNOSIS — M25519 Pain in unspecified shoulder: Secondary | ICD-10-CM

## 2011-02-28 MED ORDER — METHYLPREDNISOLONE ACETATE 40 MG/ML IJ SUSP: 40.0000 mg | Freq: Once | INTRAMUSCULAR | Status: DC

## 2011-02-28 NOTE — Progress Notes (Signed)
55 year old female status post IM nail LEFT femur from multitrauma status post rotator cuff repair RIGHT shoulder and distal clavicle excision  Presents for injection RIGHT shoulder and injection LEFT knee  Currently followed at [facility] for planned removal of LEFT femoral nail which was done there  Complains of severe medial joint line pain on the LEFT knee and chronic pain in the RIGHT shoulder she also has chronic pain in general  Currently on hydrocodone 10 mg q.4 hours p.r.n. Pain  We applied an injection to the LEFT knee into the subacromial space of the RIGHT shoulder which were well tolerated  Followup p.r.n.

## 2011-02-28 NOTE — Discharge Summary (Signed)
Separately identifiable procedure report  Informed consent was obtained verbally.  Time out was taken.  RIGHT shoulder was injected subacromial space.   Alcohol was used to prep the shoulder, along with ethyl chloride.  40 mg of Depo-Medrol and 3 cc of 1% lidocaine was injected into the subacromial space.  There were no complications   Injection LEFT knee.  Consent was obtained.  Time out was taken   LEFT knee was injected with Depo-Medrol 40 mg plus lidocaine 1% 4 cc.  Knee was prepped with alcohol and anesthetized with ethyl chloride.  The injection was tolerated without complication.

## 2011-02-28 NOTE — Patient Instructions (Signed)
You have received a steroid shot. 15% of patients experience increased pain at the injection site with in the next 24 hours. This is best treated with ice and tylenol extra strength 2 tabs every 8 hours. If you are still having pain please call the office.    
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
## 2011-03-12 NOTE — Telephone Encounter (Signed)
Pt requesting pain medication.  
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
## 2011-03-12 NOTE — Procedures (Signed)
NAMEBRITTNI, Rachel [name]           ACCOUNT NO.:  [id number]   MEDICAL RECORD NO.:  [id number]          PATIENT TYPE:  OUT   LOCATION:  RAD                           FACILITY:  [facility]   PHYSICIAN:  Gerrit Friends. Dietrich Pates, MD, FACCDATE OF BIRTH:  Apr 30, 1956   DATE OF PROCEDURE:  03/27/2008  DATE OF DISCHARGE:  03/25/2008                                ECHOCARDIOGRAM   REFERRING PHYSICIAN:  Franchot Heidelberg, MD   CLINICAL DATA:  This is a 55 year old woman who developed AV block  during induction for noncardiac surgery.   1. Progressive dobutamine infusion to a final dose of 40 mcg/kg/min      resulted in a heart rate of 147, 88% of age - predicted maximum.      The patient reported malaise as her only symptom.  2. Blood pressure increased from a resting value of 140/80 to 180/60      during drug infusion, a normal response.  No arrhythmias noted.  3. Resting EKG:  Normal sinus rhythm; first-degree AV block; slight J-      point elevation; slight nonspecific T-wave abnormality.  4. Stress EKG:  No significant change.  5. Echocardiogram during pharmacologic stress:  Normal left      ventricular size; no LVH; hyperdynamic LV systolic function.      Normal mitral valve with mild annular calcification.  6. Post-exercise echocardiogram:  Decreased image quality noted in the      stress portion of the study; nonetheless, there was a definite      decrease in left ventricular volume and increase in contractility      in all myocardial segments at both low-dose and at peak dose of      dobutamine.   IMPRESSION:  Negative pharmacologic stress echocardiogram.      Gerrit Friends. Dietrich Pates, MD, [facility]  Electronically Signed     RMR/MEDQ  D:  03/27/2008  T:  03/28/2008  Job:  [id number]

## 2011-03-12 NOTE — Telephone Encounter (Signed)
Pt. States has not call this office for pain medication.
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
## 2011-03-12 NOTE — Op Note (Signed)
Rachel [name], Rachel [name]           ACCOUNT NO.:  [id number]   MEDICAL RECORD NO.:  [id number]          PATIENT TYPE:  AMB   LOCATION:  DAY                           FACILITY:  [facility]   PHYSICIAN:  Vickki Hearing, M.D.DATE OF BIRTH:  29-Oct-1955   DATE OF PROCEDURE:  04/08/2008  DATE OF DISCHARGE:                               OPERATIVE REPORT   She is a 55 year old female who was previously scheduled for arthroscopy  of the left shoulder with mini-open rotator cuff repair for left rotator  cuff tear, but at the time of preop, had heart block.  She was evaluated  by [facility], cleared, and came back for surgery today.   She had left shoulder pain for approximately 3 months, now atraumatic  onset with severe pain.  She presented to the ER, was eventually  referred to me, she was treated with rest, activity modification,  subacromial injection, and physical therapy.  Her motion was severely  restricted, but improved with physical therapy, but the pain continued.  I sent for an MRI at [facility] that showed a partial cuff tear with a  PASTA-type lesion, so we scheduled her for surgery due to failure of  nonoperative care to relieve her pain and dysfunction.   PREOPERATIVE DIAGNOSIS:  Torn rotator cuff, left shoulder.   POSTOPERATIVE DIAGNOSIS:  Torn rotator cuff, left shoulder.   PROCEDURES:  1. Arthroscopy, left shoulder.  2. Mini-open rotator cuff repair.   SURGEON:  Vickki Hearing, MD   ASSISTANT:  Valetta Close.   ANESTHETIC:  General by intubation with assistance of fiberoptic device.   She was identified in the preop holding area.  She marked her left  shoulder as the surgical site.  I countersigned that.  She was given  Ancef and taken to the operating room, fiberoptic intubation, tolerated  that well.  She was placed in the semi or modified beach chair position  on the Schlein positioner, and had a left shoulder prepped with DuraPrep  and then draped  sterilely.  Time-out procedure was then completed, and  everyone on the team agreed that this was Jazzlene Huot that she was  having a left shoulder arthroscopy with mini-open rotator cuff repair  for rotator cuff tear of the left shoulder.  Antibiotics were started in  time, and there were only concerns about positioning and intubation  because of her neck surgery, which we addressed with the fiberoptic  technique.  All equipment was present.   Standard posterior portal was established.  Diagnostic arthroscopy was  performed through the glenohumeral joint.  The glenohumeral joint had no  articular lesions.  The superior labrum anterior to posterior was  intact.  The subscapularis superior edge was normal.  The labrum was  normal.  There were no loose bodies in the axillary pouch.  There was  intact rotator cuff tissue throughout the undersurface of the rotator  cuff.   The scope was placed in the subacromial space where there was a severe  inflammatory reaction with a bursitis, which was debrided through an  anterior portal.  An ArthroCare wand was placed through a  lateral portal  to assist with this.  Once we got clearance, there was a small medial  acromioclavicular spur, which was removed.  The tear was found by  externally rotating the arm and to visualization from the posterior  portal, and we noted what really was a 90% bursal side tear with only a  small amount of humeral head visible.   We took the scope out of the joint, advanced the lateral portal  superiorly, and performed mini-open rotator cuff repair with a PEEK  anchor, we placed the awl, passed the anchor, then passed two sutures  through the cuff, the cuff tear was non-retracted, and was easily sewn  down.  The grasping sutures, which were placed during the arthroscopic  portion of the procedure, were kept in the cuff and attached to the  periosteum and the lateral humerus, and this gave an excellent closure,   watertight with good repair.  No tension noted on the repair.  The wound  was irrigated and closed with 0, 2-0, 3-0 Prolene.  Portal sites were  closed with Prolene.  Pain pump catheter placed in the subcu space and  activated.  Additional 20 mL of Marcaine placed in the deep tissues to  assist with the postop pain control.   The patient was extubated and taken to recovery room in stable  condition.  She is discharged on Percocet 5, Phenergan 25, and Robaxin  500 mg.  She will follow up next week and then we will start therapy  next week.      Vickki Hearing, M.D.  Electronically Signed     SEH/MEDQ  D:  04/08/2008  T:  04/09/2008  Job:  [id number]

## 2011-03-12 NOTE — H&P (Signed)
Rachel [name], Rachel [name]           ACCOUNT NO.:  [id number]   MEDICAL RECORD NO.:  [id number]          PATIENT TYPE:  AMB   LOCATION:  DAY                           FACILITY:  [facility]   PHYSICIAN:  Vickki Hearing, M.D.DATE OF BIRTH:  1956-03-31   DATE OF ADMISSION:  DATE OF DISCHARGE:  LH                              HISTORY & PHYSICAL   CHIEF COMPLAINTS:  Left shoulder pain.   HISTORY:  Rachel [name] is a 55 year old female who was scheduled for  arthroscopy of the left shoulder mini-open rotator cuff repair for a  left rotator cuff tear.  At the time of preoperative evaluation, the  patient had a heart block and was evaluated by [facility].  She  has since been cleared by Cardiology.  She had an echocardiogram with a  negative pharmacological stress test and now presents back for same  procedure.  Her shoulder is still bothering her since that time she has  been to the emergency room for knee pain and in my office for cortisone  injection in the left knee for iliotibial band bursitis.   There are no updates as far as her physical exam go.  We still planned  the same procedure on the left shoulder arthroscopy with mini-open  rotator cuff repair planned for April 08, 2008, with a followup schedule  for April 12, 2008.      Vickki Hearing, M.D.  Electronically Signed     SEH/MEDQ  D:  04/07/2008  T:  04/07/2008  Job:  [id number]

## 2011-03-14 ENCOUNTER — Other Ambulatory Visit: Payer: Self-pay | Admitting: Orthopedic Surgery

## 2011-03-14 DIAGNOSIS — R52 Pain, unspecified: Secondary | ICD-10-CM

## 2011-03-14 MED ORDER — HYDROCODONE-ACETAMINOPHEN 10-325 MG PO TABS: 1.0000 | ORAL_TABLET | ORAL | Status: AC | PRN

## 2011-03-15 NOTE — Discharge Summary (Signed)
[facility]  Patient:    Rachel [name], Rachel [name] Visit Number: [id number] MRN: [id number]          Service Type: MED Location: ICCU AC16 60 Attending Physician:  Herbert Seta Dictated by:   Elfredia Nevins, M.D. Admit Date:  01/05/2002 Disc. Date: 01/06/02                             Discharge Summary  DATE OF BIRTH:  1956-03-08  DISCHARGE DIAGNOSIS:  Nontoxic overdose on Neurontin.  HOSPITAL COURSE:  The patient was admitted after overdosing on Neurontin.  Her initial presentation was lethargy.  She was admitted to the ICU for close observation and suffered no untoward effects.  On the a.m. of discharge she is wide awake and alert.  While in-house she was seen in consultation by ACT team, who concurred with commitment, and transfer is being arranged at the time of dictation.  CONDITION ON DISCHARGE:  Stable. Dictated by:   Elfredia Nevins, M.D. Attending Physician:  Herbert Seta DD:  01/06/02 TD:  01/06/02 Job: [id number]
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
## 2011-03-15 NOTE — Discharge Summary (Signed)
NAME:  Rachel [name], Rachel [name]                     ACCOUNT NO.:  [id number]   MEDICAL RECORD NO.:  [id number]                   PATIENT TYPE:  INP   LOCATION:  A326                                 FACILITY:  [facility]   PHYSICIAN:  Dirk Dress. Katrinka Blazing, M.D.                DATE OF BIRTH:  July 11, 1956   DATE OF ADMISSION:  03/06/2004  DATE OF DISCHARGE:  03/10/2004                                 DISCHARGE SUMMARY   DISCHARGE DIAGNOSES:  1. Acute bronchitis.  2. Acute hypoxemia.  3. Chronic depression.  4. Pleuritic chest pain.  5. History of migraine headaches.  6. Dramatic blindness of the left eye.  7. Chronic left hip and leg pain due to old fractures.   DISPOSITION:  The patient is discharged home in stable, improved condition.   DISCHARGE MEDICATIONS:  1. Combivent two puffs q.i.d.  2. Xanax 1 mg t.i.d.  3. Celexa 40 mg q.d.  4. Neurontin 600 mg q.8 h.  5. Zyprexa 5 mg q.h.s.  6. Lasix 20 mg q.d.  7. Potassium 20 mEq q.d.  8. Zithromax 250 mg q.d.  9. Histussin HC one teaspoon q.4 to 6 h. p.r.n.  10.      Omeprazole 20 mg q.d.   The patient is scheduled to see Dr. Loleta Chance one week post discharge.   SUMMARY:  This is a 55 year old female who was admitted with presumed acute  bronchitis.  The patient gives a history of acute onset of fever, chills,  body aches, cough productive of yellow-brown sputum with wheezing.  This  occurred on the morning of admission.  Her symptoms became more severe.  She  had severe paroxysms of coughing.  She was seen in the emergency room, where  she was noted to be hypoxic.  CT of the chest was done and it showed an  infiltrate in the right upper lobe.  She also had central airway thickening,  thought to be a manifestation of bronchitis.  Pulmonary embolus was ruled  out.  The patient had a temperature of 101 degrees.  Because of her  hypoxemia and fever, it was felt that she needed to be admitted for  treatment.   PAST MEDICAL HISTORY:  Significant in  that she has a long history of  psychiatric illness with severe psychotic depression and posttraumatic  stress disorder.  She has had five psychiatric admissions, dating back to  2002.  She has had two admissions associated with drug overdose.   PHYSICAL EXAMINATION:  VITAL SIGNS:  Blood pressure 140/80; pulse 110;  respirations 24; temperature 101.3.  NEUROLOGIC:  She had a very flat affect.  She responded in short, very terse  responses.  LUNGS:  She had a few rhonchi and wheezes with decreased breath sounds  bilaterally and bibasilar rales.  EXTREMITIES:  Unremarkable, except for 1+ edema.   Room air blood gas:  pH 7.45, PCO2 35, PO2 51, bicarbonate 24.  White count  6,900, hemoglobin 12.4, hematocrit 36.4.  Metabolic-7 was normal.  CK was  149, CK MB 2.2.   The patient was admitted and started on IV Zithromax and oral __________.  She was given Xopenex and Atrovent nebulizers q.4 h.  Serial cardiac enzymes  were done.  Cardiac enzymes were negative for myocardial injury.  The  patient responded appropriately to treatment.  Her PO2 gradually improved.  By the 13th, she was feeling much better; lungs were clear.  Room air blood  gas revealed a PO2 of 78.  She was felt to be stable at that time and she  was discharged home on the 14th in satisfactory condition.     ___________________________________________                                         Dirk Dress Katrinka Blazing, M.D.   LCS/MEDQ  D:  04/01/2004  T:  04/02/2004  Job:  [id number]

## 2011-03-15 NOTE — Discharge Summary (Signed)
[facility]  Patient:    Rachel [name], Rachel [name]                  MRN: [id number] Adm. Date:  [id number] Disc. Date: [id number] Attending:  Milford Cage H                           Discharge Summary  REASON FOR ADMISSION:  A 55 year old female from [place], [place], who was discharged last week from this unit. DD:  03/02/01 TD:  03/03/01 Job: [id number]

## 2011-03-15 NOTE — Op Note (Signed)
NAMELILLYN, [name]           ACCOUNT NO.:  [id number]   MEDICAL RECORD NO.:  [id number]          PATIENT TYPE:  AMB   LOCATION:  DAY                           FACILITY:  [facility]   PHYSICIAN:  Vickki Hearing, M.D.DATE OF BIRTH:  08/06/1956   DATE OF PROCEDURE:  10/03/2006  DATE OF DISCHARGE:  10/03/2006                               OPERATIVE REPORT   PREOPERATIVE DIAGNOSIS:  Medial meniscal tear left knee.   POSTOPERATIVE DIAGNOSIS:  Medial meniscal tear left knee.   PROCEDURE:  Arthroscopy left knee, partial medial meniscectomy,  chondroplasty medial femoral condyle.   SURGEON:  Vickki Hearing, M.D.   ANESTHESIA:  General.   FINDINGS:  There was a torn medial meniscus posterior horn.  There was  grade 2 changes of the chondral area of the medial femoral condyle.  The  remaining portions of the knee were essentially benign, only some mild  cartilaginous changes were noted which did not require treatment.  The  patient's indication for surgery was medial knee pain.  Preoperative  evaluation included a history, physical, and MRI which showed torn  medial meniscus.   DESCRIPTION OF PROCEDURE:  The patient was identified as [name].  The history and physical was updated.  The left knee was  marked as the surgical site, countersigned by me, the surgeon.  The  patient was taken to the operating room for general anesthesia.  Antibiotics were started prior to any skin incision.  Timeout procedure  was completed.  The patient's left knee was correctly identified as the  surgical site.  The patient's identity was confirmed and antibiotics  confirmed at the proper time.   After sterile prep and drape and the timeout procedure had been  completed, a lateral portal was established.  The diagnostic arthroscopy  was performed.  The medial portal was established.  The diagnostic  procedure was completed with the assistance of the probe.  The torn  medial meniscus was  resected with a combination of arthroscopic  instruments using a Duckbill forceps and a motorized shaver.  Chondroplasty was performed as required on the medial femoral condyle.  Meniscal fragments were removed, the knee was irrigated, wounds were  closed with 3-0 nylon.  20 mL of Marcaine was injected into the knee and  the patient's knee was dressed sterilely.  A Cryo/Cuff was applied.  The  patient was extubated, taken to the recovery room in stable condition.  Follow-up will be on Monday.  She can be weightbearing as tolerated.  She can start physical therapy on Tuesday.     Vickki Hearing, M.D.  Electronically Signed    SEH/MEDQ  D:  10/06/2006  T:  10/06/2006  Job:  [id number]
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
## 2011-03-15 NOTE — Discharge Summary (Signed)
[facility]  Patient:    OCTIVIA, CANION                  MRN: [id number] Adm. Date:  [id number] Disc. Date: [id number] Attending:  Denny Peon CC:         Reymundo Poll Dub Mikes, M.D.   Discharge Summary  INTRODUCTION:  Zoria Rawlinson is a 55 year old divorced black female who was admitted because of signs of psychosis.  She hears somebody at the door, hearing _______ in her head, seeing box and spots and feeling that somebody is putting thoughts in her head, probably the devil.  The patient was recently hospitalized in April for depression under the service of Dr. Katrinka Blazing and being followed by [facility].  She was most recently prescribed Celexa for symptoms of depression.  Medically, the patient did not have any major ongoing medical problems.  Her recent medications were trazodone, Ambien, Xanax, and Celexa.  HOSPITAL COURSE:  On Mar 20, 2001, she was seen by Madie Reno A. Dub Mikes, M.D., who felt the patient was free from dangerous ideations and there was no indication for further hospitalization.  It was in accordance the patients request to be discharged from the hospital.  MEDICAL PROBLEMS DURING HOSPITAL STAY:  The patient did not give any medical problems. Vital signs were stable with slight elevation of temperature at 99.5, normal blood pressure, pulse, respiratory rate.  No blood work was ordered this time since the patient was recently hospitalized on the unit.  DISCHARGE DIAGNOSES: Axis I:    Major depression, recurrent, moderate. Axis II:   Diagnosis deferred. Axis III:  No diagnosis. Axis IV:   Mild to moderate, related to primary support group. Axis V:    Global Assessment of Functioning on admission 35, maximum for            past year 60, upon discharge 60.  DISCHARGE RECOMMENDATION:  The patient was discharged on Zyprexa 2.5 mg at bedtime, Neurontin 600 mg three times a day, Celexa 40 mg in the morning, Xanax  1 mg twice a day, Restoril 15 mg as needed for insomnia and trazodone 50 mg at bedtime.  The patient has appointment with [facility] on March 30, 2001, at 2 p.m. DD:  05/03/01 TD:  05/04/01 Job: [id number]

## 2011-03-15 NOTE — H&P (Signed)
[facility]  Patient:    Rachel [name], Rachel [name] Visit Number: [id number] MRN: [id number]          Service Type: MED Location: ICCU [id number] Attending Physician:  Herbert Seta Dictated by:   Elfredia Nevins, M.D. Admit Date:  01/05/2002                           History and Physical  CHIEF COMPLAINT:  Overdose.  HISTORY OF PRESENT ILLNESS:  This patient overdosed on approximately 20 or 30 gabapentin (Neurontin) tablets.  She was originally prescribed this by mental health for mood stabilization.  These were 600 mg x max 30 approximately one hour prior to emergency room visitation.  She had no real untoward effect except lethargy.  Upon arrival to the emergency department she was treated with activated charcoal orally.  Review of the PDR shows lethargy as the main side effect and, although dialyzable, none of the overdoses reported needed to be dialyzed.  PAST MEDICAL HISTORY:  Previous suicidal depression and overdose approximately one year ago according to a daughter who was interviewed.  FAMILY HISTORY:  Noncontributory.  REVIEW OF SYSTEMS:  She denies any specific complaint at present.  PHYSICAL EXAMINATION:  SKIN:  Unremarkable.  HEENT AND NECK:  No JVD or adenopathy.  Status post cervical laminectomy scar.  CHEST:  Clear.  CARDIAC:  Regular rhythm without murmur, gallop, or rub.  ABDOMEN:  Soft.  No organomegaly or mass.  EXTREMITIES:  Without clubbing, cyanosis, or edema  NEUROLOGIC:  Mild lethargy although easily arousable and conversant.  No focal deficits.  IMPRESSION:  Overdose on gabapentin.  PLAN:  Admit to ICU.  Suicide and elopement precautions.  Behavioral Health consultation.  Monitor pulse oximetry, and anticipate discharge to mental health in the a.m. Dictated by:   Elfredia Nevins, M.D. Attending Physician:  Herbert Seta DD:  01/05/02 TD:  01/05/02 Job: [id number]
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
## 2011-03-21 ENCOUNTER — Ambulatory Visit (INDEPENDENT_AMBULATORY_CARE_PROVIDER_SITE_OTHER): Payer: Medicaid Other | Admitting: Otolaryngology

## 2011-03-21 DIAGNOSIS — H6980 Other specified disorders of Eustachian tube, unspecified ear: Secondary | ICD-10-CM

## 2011-03-28 ENCOUNTER — Ambulatory Visit (INDEPENDENT_AMBULATORY_CARE_PROVIDER_SITE_OTHER): Payer: Medicaid Other | Admitting: Otolaryngology

## 2011-03-28 DIAGNOSIS — H6980 Other specified disorders of Eustachian tube, unspecified ear: Secondary | ICD-10-CM

## 2011-04-11 ENCOUNTER — Other Ambulatory Visit: Payer: Self-pay | Admitting: *Deleted

## 2011-04-11 ENCOUNTER — Other Ambulatory Visit: Payer: Self-pay | Admitting: Orthopedic Surgery

## 2011-04-11 DIAGNOSIS — G8929 Other chronic pain: Secondary | ICD-10-CM

## 2011-04-11 MED ORDER — HYDROCODONE-ACETAMINOPHEN 10-325 MG PO TABS: 1.0000 | ORAL_TABLET | ORAL | Status: AC | PRN

## 2011-04-15 ENCOUNTER — Telehealth: Payer: Self-pay | Admitting: Radiology

## 2011-04-15 NOTE — Telephone Encounter (Signed)
I sent a referral for this patient to [facility] for pain management.

## 2011-04-23 ENCOUNTER — Emergency Department (HOSPITAL_COMMUNITY)
Admission: EM | Admit: 2011-04-23 | Discharge: 2011-04-23 | Disposition: A | Discharge status: Home / Self Care | Payer: Medicaid Other | Attending: Emergency Medicine | Admitting: Emergency Medicine

## 2011-04-23 DIAGNOSIS — E785 Hyperlipidemia, unspecified: Secondary | ICD-10-CM | POA: Insufficient documentation

## 2011-04-23 DIAGNOSIS — M549 Dorsalgia, unspecified: Secondary | ICD-10-CM | POA: Insufficient documentation

## 2011-04-23 DIAGNOSIS — Z79899 Other long term (current) drug therapy: Secondary | ICD-10-CM | POA: Insufficient documentation

## 2011-04-23 DIAGNOSIS — F3289 Other specified depressive episodes: Secondary | ICD-10-CM | POA: Insufficient documentation

## 2011-04-23 DIAGNOSIS — I10 Essential (primary) hypertension: Secondary | ICD-10-CM | POA: Insufficient documentation

## 2011-04-23 DIAGNOSIS — M1610 Unilateral primary osteoarthritis, unspecified hip: Secondary | ICD-10-CM | POA: Insufficient documentation

## 2011-04-23 DIAGNOSIS — M169 Osteoarthritis of hip, unspecified: Secondary | ICD-10-CM | POA: Insufficient documentation

## 2011-04-23 DIAGNOSIS — G8929 Other chronic pain: Secondary | ICD-10-CM | POA: Insufficient documentation

## 2011-04-23 DIAGNOSIS — M25559 Pain in unspecified hip: Secondary | ICD-10-CM | POA: Insufficient documentation

## 2011-04-23 DIAGNOSIS — F329 Major depressive disorder, single episode, unspecified: Secondary | ICD-10-CM | POA: Insufficient documentation

## 2011-04-29 ENCOUNTER — Telehealth: Payer: Self-pay | Admitting: Radiology

## 2011-04-29 NOTE — Telephone Encounter (Signed)
I called to give the patient her appointment at [facility] on 05-09-11 at 10:15. Patient is aware of her appointment.

## 2011-05-07 ENCOUNTER — Other Ambulatory Visit: Payer: Self-pay | Admitting: *Deleted

## 2011-05-07 DIAGNOSIS — G8929 Other chronic pain: Secondary | ICD-10-CM

## 2011-05-07 MED ORDER — HYDROCODONE-ACETAMINOPHEN 10-325 MG PO TABS: 1.0000 | ORAL_TABLET | ORAL | Status: DC | PRN

## 2011-05-09 ENCOUNTER — Ambulatory Visit: Payer: Medicaid Other | Admitting: Neurosurgery

## 2011-05-15 ENCOUNTER — Telehealth: Payer: Self-pay | Admitting: Orthopedic Surgery

## 2011-05-15 NOTE — Telephone Encounter (Signed)
Patient called, requests to speak with nurse.  I asked for any additional message, and asked if I may help her; asked if nurse can just please call her.  Ph# [phone].
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
## 2011-05-16 ENCOUNTER — Other Ambulatory Visit (HOSPITAL_COMMUNITY)
Admission: RE | Admit: 2011-05-16 | Discharge: 2011-05-16 | Disposition: A | Discharge status: Home / Self Care | Payer: Medicaid Other | Source: Ambulatory Visit | Attending: Obstetrics and Gynecology | Admitting: Obstetrics and Gynecology

## 2011-05-16 ENCOUNTER — Other Ambulatory Visit: Payer: Self-pay | Admitting: Adult Health

## 2011-05-16 DIAGNOSIS — Z01419 Encounter for gynecological examination (general) (routine) without abnormal findings: Secondary | ICD-10-CM | POA: Insufficient documentation

## 2011-05-20 ENCOUNTER — Other Ambulatory Visit: Payer: Self-pay | Admitting: *Deleted

## 2011-05-20 DIAGNOSIS — G8929 Other chronic pain: Secondary | ICD-10-CM

## 2011-05-20 MED ORDER — HYDROCODONE-ACETAMINOPHEN 10-325 MG PO TABS: 1.0000 | ORAL_TABLET | ORAL | Status: AC | PRN

## 2011-05-20 NOTE — Telephone Encounter (Signed)
I filled pain med for the last time has appt to see pain management on 05/29/11 so I gave patient only 60 tablets of Hydrocodone enough until seen by that dr.

## 2011-05-29 ENCOUNTER — Encounter: Payer: Medicaid Other | Attending: Neurosurgery | Admitting: Neurosurgery

## 2011-05-29 ENCOUNTER — Telehealth: Payer: Self-pay | Admitting: *Deleted

## 2011-05-29 DIAGNOSIS — Z79899 Other long term (current) drug therapy: Secondary | ICD-10-CM | POA: Insufficient documentation

## 2011-05-29 DIAGNOSIS — M5430 Sciatica, unspecified side: Secondary | ICD-10-CM

## 2011-05-29 DIAGNOSIS — F329 Major depressive disorder, single episode, unspecified: Secondary | ICD-10-CM | POA: Insufficient documentation

## 2011-05-29 DIAGNOSIS — F3289 Other specified depressive episodes: Secondary | ICD-10-CM | POA: Insufficient documentation

## 2011-05-29 DIAGNOSIS — M25569 Pain in unspecified knee: Secondary | ICD-10-CM

## 2011-05-29 DIAGNOSIS — R5383 Other fatigue: Secondary | ICD-10-CM | POA: Insufficient documentation

## 2011-05-29 DIAGNOSIS — M51379 Other intervertebral disc degeneration, lumbosacral region without mention of lumbar back pain or lower extremity pain: Secondary | ICD-10-CM | POA: Insufficient documentation

## 2011-05-29 DIAGNOSIS — M545 Low back pain, unspecified: Secondary | ICD-10-CM | POA: Insufficient documentation

## 2011-05-29 DIAGNOSIS — G8929 Other chronic pain: Secondary | ICD-10-CM | POA: Insufficient documentation

## 2011-05-29 DIAGNOSIS — R5381 Other malaise: Secondary | ICD-10-CM | POA: Insufficient documentation

## 2011-05-29 DIAGNOSIS — M62838 Other muscle spasm: Secondary | ICD-10-CM | POA: Insufficient documentation

## 2011-05-29 DIAGNOSIS — M5137 Other intervertebral disc degeneration, lumbosacral region: Secondary | ICD-10-CM | POA: Insufficient documentation

## 2011-05-29 DIAGNOSIS — R209 Unspecified disturbances of skin sensation: Secondary | ICD-10-CM | POA: Insufficient documentation

## 2011-05-29 DIAGNOSIS — M79609 Pain in unspecified limb: Secondary | ICD-10-CM | POA: Insufficient documentation

## 2011-05-29 NOTE — Telephone Encounter (Signed)
Called stating that she went [facility] saw Dr. Lauree Chandler he advised her that he did not give pain medicine on the first visit and she is not due to go back to see him until October, we are waiting on the notes from Dr. Webb Silversmith to be sent to [place], do we continue to give pain med until she is seen again in October.

## 2011-05-29 NOTE — Telephone Encounter (Signed)
lmom for patient

## 2011-05-29 NOTE — Telephone Encounter (Signed)
no
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
## 2011-06-11 ENCOUNTER — Other Ambulatory Visit: Payer: Self-pay | Admitting: Physical Medicine & Rehabilitation

## 2011-06-11 ENCOUNTER — Ambulatory Visit (HOSPITAL_COMMUNITY)
Admission: RE | Admit: 2011-06-11 | Discharge: 2011-06-11 | Disposition: A | Discharge status: Home / Self Care | Payer: Medicaid Other | Source: Ambulatory Visit | Attending: Physical Medicine & Rehabilitation | Admitting: Physical Medicine & Rehabilitation

## 2011-06-11 DIAGNOSIS — M5430 Sciatica, unspecified side: Secondary | ICD-10-CM

## 2011-06-11 DIAGNOSIS — M51379 Other intervertebral disc degeneration, lumbosacral region without mention of lumbar back pain or lower extremity pain: Secondary | ICD-10-CM | POA: Insufficient documentation

## 2011-06-11 DIAGNOSIS — M25569 Pain in unspecified knee: Secondary | ICD-10-CM

## 2011-06-11 DIAGNOSIS — M899 Disorder of bone, unspecified: Secondary | ICD-10-CM | POA: Insufficient documentation

## 2011-06-11 DIAGNOSIS — M545 Low back pain, unspecified: Secondary | ICD-10-CM | POA: Insufficient documentation

## 2011-06-11 DIAGNOSIS — M5137 Other intervertebral disc degeneration, lumbosacral region: Secondary | ICD-10-CM | POA: Insufficient documentation

## 2011-06-11 DIAGNOSIS — M25669 Stiffness of unspecified knee, not elsewhere classified: Secondary | ICD-10-CM | POA: Insufficient documentation

## 2011-06-11 DIAGNOSIS — M25659 Stiffness of unspecified hip, not elsewhere classified: Secondary | ICD-10-CM | POA: Insufficient documentation

## 2011-06-11 DIAGNOSIS — M25559 Pain in unspecified hip: Secondary | ICD-10-CM | POA: Insufficient documentation

## 2011-06-26 ENCOUNTER — Encounter (HOSPITAL_BASED_OUTPATIENT_CLINIC_OR_DEPARTMENT_OTHER): Payer: Medicaid Other | Admitting: Neurosurgery

## 2011-06-26 DIAGNOSIS — G894 Chronic pain syndrome: Secondary | ICD-10-CM

## 2011-06-26 DIAGNOSIS — M25559 Pain in unspecified hip: Secondary | ICD-10-CM
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
## 2011-07-24 ENCOUNTER — Encounter: Payer: Medicaid Other | Attending: Neurosurgery | Admitting: Neurosurgery

## 2011-07-24 ENCOUNTER — Ambulatory Visit: Payer: Medicaid Other | Admitting: Physical Medicine & Rehabilitation

## 2011-07-24 DIAGNOSIS — G8929 Other chronic pain: Secondary | ICD-10-CM | POA: Insufficient documentation

## 2011-07-24 DIAGNOSIS — Q762 Congenital spondylolisthesis: Secondary | ICD-10-CM

## 2011-07-24 DIAGNOSIS — M545 Low back pain, unspecified: Secondary | ICD-10-CM | POA: Insufficient documentation

## 2011-07-24 DIAGNOSIS — G894 Chronic pain syndrome: Secondary | ICD-10-CM

## 2011-07-24 LAB — CBC
Hemoglobin: 13.5
MCHC: 35.6
MCV: 86.2
RDW: 12.9

## 2011-07-24 LAB — BASIC METABOLIC PANEL
GFR calc Af Amer: >60
GFR calc non Af Amer: >60
Glucose, Bld: 108 — ABNORMAL HIGH
Potassium: 3.9
Sodium: 138

## 2011-07-24 LAB — POCT CARDIAC MARKERS: Myoglobin, poc: 71.8
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
## 2011-07-25 LAB — HEMOGLOBIN AND HEMATOCRIT, BLOOD: HCT: 32.2 — ABNORMAL LOW

## 2011-07-25 LAB — BASIC METABOLIC PANEL
CO2: 27
Calcium: 9.3
Chloride: 105
GFR calc Af Amer: >60
Glucose, Bld: 95
Potassium: 3.7
Sodium: 137

## 2011-08-21 ENCOUNTER — Encounter
Payer: Medicaid Other | Attending: Physical Medicine and Rehabilitation | Admitting: Physical Medicine and Rehabilitation

## 2011-08-21 DIAGNOSIS — R209 Unspecified disturbances of skin sensation: Secondary | ICD-10-CM

## 2011-08-21 DIAGNOSIS — M545 Low back pain, unspecified: Secondary | ICD-10-CM | POA: Insufficient documentation

## 2011-08-21 DIAGNOSIS — Z96649 Presence of unspecified artificial hip joint: Secondary | ICD-10-CM | POA: Insufficient documentation

## 2011-08-21 DIAGNOSIS — M1710 Unilateral primary osteoarthritis, unspecified knee: Secondary | ICD-10-CM | POA: Insufficient documentation

## 2011-08-21 DIAGNOSIS — M79609 Pain in unspecified limb: Secondary | ICD-10-CM

## 2011-08-21 DIAGNOSIS — R279 Unspecified lack of coordination: Secondary | ICD-10-CM

## 2011-08-21 DIAGNOSIS — R269 Unspecified abnormalities of gait and mobility: Secondary | ICD-10-CM | POA: Insufficient documentation
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
## 2011-08-23 ENCOUNTER — Other Ambulatory Visit (HOSPITAL_COMMUNITY): Payer: Medicaid Other

## 2011-08-23 ENCOUNTER — Other Ambulatory Visit: Payer: Self-pay | Admitting: Physical Medicine and Rehabilitation

## 2011-08-23 DIAGNOSIS — M545 Low back pain: Secondary | ICD-10-CM

## 2011-08-23 DIAGNOSIS — M79609 Pain in unspecified limb: Secondary | ICD-10-CM

## 2011-08-29 ENCOUNTER — Ambulatory Visit (HOSPITAL_COMMUNITY)
Admission: RE | Admit: 2011-08-29 | Discharge: 2011-08-29 | Disposition: A | Discharge status: Home / Self Care | Payer: Medicaid Other | Source: Ambulatory Visit | Attending: Physical Medicine and Rehabilitation | Admitting: Physical Medicine and Rehabilitation

## 2011-08-29 DIAGNOSIS — M545 Low back pain, unspecified: Secondary | ICD-10-CM

## 2011-08-29 DIAGNOSIS — M25559 Pain in unspecified hip: Secondary | ICD-10-CM | POA: Insufficient documentation

## 2011-08-29 DIAGNOSIS — N83209 Unspecified ovarian cyst, unspecified side: Secondary | ICD-10-CM | POA: Insufficient documentation

## 2011-08-29 DIAGNOSIS — M5126 Other intervertebral disc displacement, lumbar region: Secondary | ICD-10-CM | POA: Insufficient documentation

## 2011-08-29 DIAGNOSIS — M79609 Pain in unspecified limb: Secondary | ICD-10-CM

## 2011-09-18 ENCOUNTER — Ambulatory Visit: Payer: Medicaid Other | Admitting: Physical Medicine and Rehabilitation

## 2011-09-23 ENCOUNTER — Encounter
Payer: Medicaid Other | Attending: Physical Medicine and Rehabilitation | Admitting: Physical Medicine and Rehabilitation

## 2011-09-23 DIAGNOSIS — Q762 Congenital spondylolisthesis: Secondary | ICD-10-CM | POA: Insufficient documentation

## 2011-09-23 DIAGNOSIS — M545 Low back pain, unspecified: Secondary | ICD-10-CM

## 2011-09-23 DIAGNOSIS — G894 Chronic pain syndrome: Secondary | ICD-10-CM

## 2011-09-23 DIAGNOSIS — M1710 Unilateral primary osteoarthritis, unspecified knee: Secondary | ICD-10-CM

## 2011-09-23 DIAGNOSIS — Z96649 Presence of unspecified artificial hip joint: Secondary | ICD-10-CM | POA: Insufficient documentation

## 2011-09-23 DIAGNOSIS — M5126 Other intervertebral disc displacement, lumbar region: Secondary | ICD-10-CM | POA: Insufficient documentation

## 2011-09-23 DIAGNOSIS — M25569 Pain in unspecified knee: Secondary | ICD-10-CM | POA: Insufficient documentation

## 2011-09-23 DIAGNOSIS — M79609 Pain in unspecified limb: Secondary | ICD-10-CM | POA: Insufficient documentation

## 2011-09-23 DIAGNOSIS — R269 Unspecified abnormalities of gait and mobility: Secondary | ICD-10-CM | POA: Insufficient documentation

## 2011-09-23 NOTE — Assessment & Plan Note (Signed)
Rachel [name] is a pleasant 55 year old African American woman who is seen here at our [facility].  Her chief complaint today are left leg/knee pain and left low back pain.  She is status post multitrauma many years ago and has a left femoral IM nailing as well as a total right hip replacement.  She has some degenerative changes in her lumbar spine, known retrolisthesis at L5-S1. Recent MRI also shows annular disk bulging with chronic moderate size left paracentral disk extrusion at L1-2 with an 8 mm central canal.  She tells me that Dr. Jenelle Mages, her orthopedist from [facility] once told that she has left knee osteoarthritis and she had an injection done last June.  Her chief complaint today is left knee pain bothers her especially when she is up walking.  She has had some recent foot surgery on the right with recent re-injury.  She is being followed by a podiatrist for this.  Rachel [name] is back in today for refill of her pain medication.  She uses hydrocodone not more than 3 times a day.  She has also been on Relafen in the past.  She reports average pain between 8 and 10 on a scale of 10.  She reports good relief with current meds.  Her function is most limited currently by her foot surgery per podiatry back in August.  FUNCTIONAL STATUS:  She can walk about 15 minutes.  She is able to climb stairs.  She does not drive.  She is independent with self-care.  REVIEW OF SYSTEMS:  Negative for bowel or bladder problems.  Denies suicidal ideation.  Does admit to some depression, numbness, weakness, trouble walking.  Also positive review of system, night sweats, weight loss, nausea, coughing.  I also mentioned to her that her MRI which was done on November 1 was notable for a left ovarian cyst and I have told her she would need to follow up with her gynecologist after this.  She states his name is Dr. Emelda Fear.  No other change in past medical,  social, or family history.  Exam; blood pressure is 135/46, pulse 63, respirations 16, 99% saturated on room air.  She is an obese [place] American woman who does not appear in any distress.  She is oriented x3.  Speech is clear.  Affect is bright.  She is alert, cooperative, and pleasant.  Follows commands without difficulty.  Answers my questions appropriately.  Cranial nerves, coordination are intact.  Her reflexes are diminished in the lower extremities.  No abnormal tone, clonus, or tremors are noted.  Her motor strength is relatively well preserved in the right lower extremity.  She has had some left hip flexor weakness grade 4-/5.  Rest of the lower extremity is within 5/5 range.  Straight leg raise is negative.  She reports some decreased sensation to light touch in the left lower extremity as well.  Really unable to ascertain particular dermatome, seems more patchy today.  Her left knee is evaluated.  She has a full range of motion, but she does have joint line tenderness, medial more so than lateral.  The lateral is also somewhat tender.  Effusion is not appreciated.  I do not note significant crepitus with flexion, extension at the knee calf as well.  She appears to have intact AP and lateral ligamentous structures.  IMPRESSION: 1. Left knee osteoarthritis and pain. 2. History of right total hip replacement. 3. History of left femoral intramedullary nailing with heterotopic  ossification. 4. Lumbago with known mild L5-S1 retrolisthesis.  More recent MRI     August 29, 2011, shows also annular bulging at L1-2 with chronic     moderate size left paracentral disk extrusion, AP diameter 8 mm. 5. Left lower extremity pain, had been felt to be neuropathic in     nature. 6. Gait disorder which is multifactorial due to right foot surgery and     left knee pain.  PLAN: 1. I discussed the MRI finding of a left ovarian cyst and will refer     her back to her gynecologist  Dr. Emelda Fear for further evaluation of     this. 2. Check urine drug screen.  Her last urine drug screen was at our     initial visit.  We will repeat UDS in the next couple of months. 3. We will obtain left knee x-ray. 4. Anticipate ultrasound-guided left knee injection. 5. I reviewed some strengthening exercises for her left hip flexor     strength.  We will refill her hydrocodone 5/325 one p.o. t.i.d.     #90. 6. I will see her back in a month. 7. Maintain contact with primary care physician Dr. Loleta Chance for her other     medical problems.  I have also discussed a consideration of use of gabapentin for neuropathic pain.  However she states she has tried this medication in the past.  She was not clear on what dosing she had been on, may consider trialing this again if it is warranted.  Currently, she feels she is getting rather good relief with current medicines and I do not see a need right now to add more medications to her with.  I have answered all her questions.  She is comfortable with our plan at this time.     Brantley Stage, M.D. Electronically Signed    DMK/MedQ D:  09/23/2011 11:17:10  T:  09/23/2011 20:57:21  Job #:  [id number]

## 2011-09-25 ENCOUNTER — Ambulatory Visit: Payer: Medicaid Other | Admitting: Physical Medicine and Rehabilitation

## 2011-10-01 ENCOUNTER — Telehealth: Payer: Self-pay | Admitting: Orthopedic Surgery

## 2011-10-01 ENCOUNTER — Other Ambulatory Visit: Payer: Self-pay | Admitting: Physical Medicine and Rehabilitation

## 2011-10-01 ENCOUNTER — Ambulatory Visit (HOSPITAL_COMMUNITY)
Admission: RE | Admit: 2011-10-01 | Discharge: 2011-10-01 | Disposition: A | Discharge status: Home / Self Care | Payer: Medicaid Other | Source: Ambulatory Visit | Attending: Physical Medicine and Rehabilitation | Admitting: Physical Medicine and Rehabilitation

## 2011-10-01 DIAGNOSIS — M25569 Pain in unspecified knee: Secondary | ICD-10-CM

## 2011-10-01 NOTE — Telephone Encounter (Signed)
Received call from patient, requesting refill, pain medication: Percocet 10/325. States that pharmacy has a letter from her insurance, [place], that Dr. Romeo Apple is the only doctor who can prescribe it.  I relayed that it had not been refilled the last time she requested it, as she has been referred out; she said she needs this medication.  Her ph #'s are:  [phone] (Home)  [phone] (Cell)

## 2011-10-01 NOTE — Telephone Encounter (Signed)
No we have prescribed hydrocodone 10 not percocet  Please do not ask for this again

## 2011-10-01 NOTE — Telephone Encounter (Signed)
Routed to nurse.

## 2011-10-14 ENCOUNTER — Encounter: Payer: Medicaid Other | Admitting: Physical Medicine and Rehabilitation

## 2011-11-06 ENCOUNTER — Emergency Department (HOSPITAL_COMMUNITY): Payer: Medicaid Other

## 2011-11-06 ENCOUNTER — Emergency Department (HOSPITAL_COMMUNITY)
Admission: EM | Admit: 2011-11-06 | Discharge: 2011-11-06 | Disposition: A | Discharge status: Home / Self Care | Payer: Medicaid Other | Attending: Emergency Medicine | Admitting: Emergency Medicine

## 2011-11-06 ENCOUNTER — Encounter (HOSPITAL_COMMUNITY): Payer: Self-pay

## 2011-11-06 DIAGNOSIS — M65849 Other synovitis and tenosynovitis, unspecified hand: Secondary | ICD-10-CM | POA: Insufficient documentation

## 2011-11-06 DIAGNOSIS — M779 Enthesopathy, unspecified: Secondary | ICD-10-CM

## 2011-11-06 DIAGNOSIS — Z6841 Body Mass Index (BMI) 40.0 and over, adult: Secondary | ICD-10-CM | POA: Insufficient documentation

## 2011-11-06 DIAGNOSIS — Z85038 Personal history of other malignant neoplasm of large intestine: Secondary | ICD-10-CM | POA: Insufficient documentation

## 2011-11-06 DIAGNOSIS — Z79899 Other long term (current) drug therapy: Secondary | ICD-10-CM | POA: Insufficient documentation

## 2011-11-06 DIAGNOSIS — F411 Generalized anxiety disorder: Secondary | ICD-10-CM | POA: Insufficient documentation

## 2011-11-06 DIAGNOSIS — F3289 Other specified depressive episodes: Secondary | ICD-10-CM | POA: Insufficient documentation

## 2011-11-06 DIAGNOSIS — Z96649 Presence of unspecified artificial hip joint: Secondary | ICD-10-CM | POA: Insufficient documentation

## 2011-11-06 DIAGNOSIS — F329 Major depressive disorder, single episode, unspecified: Secondary | ICD-10-CM | POA: Insufficient documentation

## 2011-11-06 DIAGNOSIS — I10 Essential (primary) hypertension: Secondary | ICD-10-CM | POA: Insufficient documentation

## 2011-11-06 DIAGNOSIS — M65839 Other synovitis and tenosynovitis, unspecified forearm: Secondary | ICD-10-CM | POA: Insufficient documentation

## 2011-11-06 DIAGNOSIS — E669 Obesity, unspecified: Secondary | ICD-10-CM | POA: Insufficient documentation

## 2011-11-06 MED ORDER — IBUPROFEN 800 MG PO TABS
800.0000 mg | ORAL_TABLET | Freq: Once | ORAL | Status: AC
  Administered 2011-11-06: 800 mg via ORAL
  Filled 2011-11-06: qty 1

## 2011-11-06 MED ORDER — IBUPROFEN 600 MG PO TABS: 600.0000 mg | ORAL_TABLET | Freq: Four times a day (QID) | ORAL | Status: AC | PRN

## 2011-11-06 NOTE — ED Notes (Signed)
Pt reports approx 1 week before Christmas started having pain in R thumb.  Pt says pain is getting worse, denies injury.  Pt has limited ROM in thumb due to pain.
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
## 2011-11-08 NOTE — ED Provider Notes (Signed)
Medical screening examination/treatment/procedure(s) were performed by non-physician practitioner and as supervising physician I was immediately available for consultation/collaboration.  Nicoletta Dress. Colon Branch, MD 11/08/11 [phone]

## 2011-11-13 ENCOUNTER — Ambulatory Visit: Payer: Medicaid Other | Admitting: Physical Medicine and Rehabilitation

## 2011-11-13 ENCOUNTER — Encounter
Payer: Medicaid Other | Attending: Physical Medicine and Rehabilitation | Admitting: Physical Medicine and Rehabilitation

## 2011-11-13 DIAGNOSIS — G894 Chronic pain syndrome: Secondary | ICD-10-CM

## 2011-11-13 DIAGNOSIS — M25559 Pain in unspecified hip: Secondary | ICD-10-CM | POA: Insufficient documentation

## 2011-11-13 DIAGNOSIS — M545 Low back pain, unspecified: Secondary | ICD-10-CM | POA: Insufficient documentation

## 2011-11-13 DIAGNOSIS — M1710 Unilateral primary osteoarthritis, unspecified knee: Secondary | ICD-10-CM

## 2011-11-13 DIAGNOSIS — R269 Unspecified abnormalities of gait and mobility: Secondary | ICD-10-CM | POA: Insufficient documentation

## 2011-11-13 DIAGNOSIS — G8929 Other chronic pain: Secondary | ICD-10-CM | POA: Insufficient documentation

## 2011-11-13 DIAGNOSIS — Z96649 Presence of unspecified artificial hip joint: Secondary | ICD-10-CM | POA: Insufficient documentation

## 2011-11-13 DIAGNOSIS — M25569 Pain in unspecified knee: Secondary | ICD-10-CM | POA: Insufficient documentation

## 2011-11-14 NOTE — Assessment & Plan Note (Signed)
Ms. Romeo Apple is a pleasant 56 year old, African American woman, who is being followed [facility] at [facility] for multiple chronic pain complaints.  These are outlined in the last note.  CHIEF COMPLAINT:  Today, is left knee pain.  Her last radiographs of the left knee was done October 01, 2011, showing osteoarthritic changes of the left knee.  No evidence of effusion. Patellar spurs at the quadriceps and patellar tendon insertions.  She is status post an IM nail located in the distal femoral diaphysis on this x- ray.  Her average pain in the knee is about a 9 on a scale of 10.  She also has some complaints of low back pain and left hip pain, intermittent left shoulder pain.  She is wearing a splint today for her tendon injury of her right thumb, which was cared for at another facility.  She is also requesting a refill of her hydrocodone.  Her last refill was back in November.  She ran out of her pain medications over a week ago.  She reports she had been getting good relief with the use of these medications.  However, she was not able to get in to clinic for a visit 1 month right after her medications refilled.  FUNCTIONAL STATUS:  She can walk 15 minutes at a time.  She uses a cane, sometimes a walker.  She is independent with self-care.  REVIEW OF SYSTEMS:  Trouble walking due to knee pain and hip pain.  She admits to depression and anxiety.  Denies suicidal ideation.  No problems with bowel or bladder control.  She also indicates on the health and history form, night sweats, weight loss, limb swelling, coughing.  I asked her to follow up with primary care for these other issues. No other changes in past medical, social, or family history other than she did have a flu a month and a half ago or so.  PHYSICAL EXAMINATION:  Today, her blood pressure is 129/76, pulse 82, respirations 18, 98% saturated on room air.  She is 241 pounds and 64 inches  tall.  She is oriented x3.  Speech is clear.  Affect is bright. She is alert, cooperative, and pleasant.  Follows commands without difficulty.  Answers my questions appropriately.  Cranial nerves and coordination are grossly intact.  Limited exam today to the left knee. This was evaluated further.  She has full extension.  She has flexion past 90 degrees at the left knee.  No effusion is appreciated.  She does have significant excess tissue due to her weight around the knee.  She has medial as well as lateral joint line tenderness.  No instability is appreciated mediolaterally or anteroposteriorly.  There is no erythema noted.  She has good strength.  She has some pain inhibition with left hip flexion, but has good strength with knee extension, knee flexion, dorsiflexion and plantar flexion as well. Reflexes are generally diminished in both lower extremities.  No abnormal tone, clonus, or tremors are noted.  She walks with an antalgic gait.  IMPRESSION: 1. Left knee osteoarthritis and pain.  This is her #1 complaint.  Pain     is about a 8 on a scale of 10. 2. Her other problems include a history of right total hip     replacement. 3. Left femoral intramedullary nail with heterotopic ossification. 4. Lumbago with known mild L5-S1 retrolisthesis. 5. Left hip pain felt to be neuropathic in nature. 6. Multifactorial gait disorder.  PLAN:  I reviewed treatment options with her today regarding her left knee.  This is the focus of this visit today.  Her chief complaint is her left knee pain.  We have reviewed various treatment options and she tells me that about 2 years ago, she had a knee injection, which gave her significant relief.  She would like to pursue this avenue again today.  I have reviewed treatment alternatives for her as well as risks and benefits of steroid injection.  She wished to proceed with that.  A consent was signed  after reviewed risks and benefits, as well  as alternatives.  She was taken back to the ultrasound room where at a limited ultrasound was performed.  Medial joint line was identified and marked.  The patient was then prepped with Betadine as well as alcohol.  The area was marked prior to prep using 1.5-inch 22-gauge needle, 1 mL of Kenalog and 5 mL of 1% lidocaine were injected without difficulty or complication into her left knee along the medial joint line.  She reported immediate relief after the injection.  Her pain scores in the left knee went from an 8-9 down to 0.  She reported significant improvement and was quite pleased with immediate results.  I gave her injection instructions, and I will see her back in 4 weeks.  We also discussed considering epidural steroid injections.  I have answered all her questions.  She is comfortable with our current plan.  I refilled her hydrocodone for her 5/325 one p.o. t.i.d. #90.     Brantley Stage, M.D. Electronically Signed    DMK/MedQ D:  11/13/2011 14:10:33  T:  11/14/2011 06:20:53  Job #:  [id number]

## 2011-11-21 ENCOUNTER — Encounter: Payer: Self-pay | Admitting: Orthopedic Surgery

## 2011-11-21 ENCOUNTER — Other Ambulatory Visit (HOSPITAL_COMMUNITY): Payer: Self-pay | Admitting: Family Medicine

## 2011-11-21 ENCOUNTER — Ambulatory Visit (INDEPENDENT_AMBULATORY_CARE_PROVIDER_SITE_OTHER): Payer: Medicaid Other | Admitting: Orthopedic Surgery

## 2011-11-21 VITALS — BP 110/60 | Ht 63.0 in | Wt 242.0 lb

## 2011-11-21 DIAGNOSIS — Z139 Encounter for screening, unspecified: Secondary | ICD-10-CM

## 2011-11-21 DIAGNOSIS — M6530 Trigger finger, unspecified finger: Secondary | ICD-10-CM

## 2011-11-21 MED ORDER — HYDROCODONE-ACETAMINOPHEN 10-325 MG PO TABS: 1.0000 | ORAL_TABLET | ORAL | Status: AC | PRN

## 2011-11-21 NOTE — Patient Instructions (Signed)
You have received a steroid shot. 15% of patients experience increased pain at the injection site with in the next 24 hours. This is best treated with ice and tylenol extra strength 2 tabs every 8 hours. If you are still having pain please call the office.    

## 2011-11-21 NOTE — Progress Notes (Addendum)
Patient ID: Rachel [name], female   DOB: 11/23/1955, 56 y.o.   MRN: [id number]    New problem.  RIGHT thumb.  The patient started having pain in her RIGHT thumb in December around 11. Complains of joint pain around the metacarpophalangeal joint with catching and locking. She went to the emergency room x-rays were negative. She was placed in a splint. No trauma.  Pain is severe.  Range of motion has been lost in the thumb. Tendons extensor, 1st and 3rd compartment, normal.  ROS: weight loss, wondering of the eyes, cough, numbness, anxiety, depression, easy bruising, seasonal allergies, stiffness of the joint and swelling.  No recent surgery on her foot for a fracture, treated with internal fixation.   Physical exam Gen. Appearance, normal, orientation x3 normal, mood and affect flat. Gait, station unrelated. Tenderness over the A1 pulley decreased range of motion. I remains stable. Strength, could not assess. Skin normal. Color, temperature, and normal. Sensation normal.  The x-ray report, and the film has been reviewed. My interpretation, of the images: R. That they are normal.  Recommend injection.  Thumb  Injection Procedure Note  Pre-operative Diagnosis: CMC arthritis Post-operative Diagnosis: CMC arthritis Indications: pain  Anesthesia: ethyl chloride   Procedure Details   Verbal consent was obtained for the procedure. Time out was completed.The joint was prepped with alcohol, followed by  Ethyl chloride spray and A 25 gauge needle was inserted into the knee via lateral approach; 1ml 1% lidocaine and 1 ml of depomedrol  was then injected into the joint . The needle was removed and the area cleansed and dressed.  Complications:  None; patient tolerated the procedure well.  Pharmacy called for duplicate prescription

## 2011-11-22 ENCOUNTER — Ambulatory Visit: Payer: Medicaid Other | Admitting: Physical Medicine and Rehabilitation

## 2011-11-28 ENCOUNTER — Ambulatory Visit (HOSPITAL_COMMUNITY): Payer: Medicaid Other

## 2011-12-23 ENCOUNTER — Encounter: Payer: Medicaid Other | Admitting: Physical Medicine and Rehabilitation

## 2012-01-09 ENCOUNTER — Ambulatory Visit (HOSPITAL_COMMUNITY)
Admission: RE | Admit: 2012-01-09 | Discharge: 2012-01-09 | Disposition: A | Discharge status: Home / Self Care | Payer: Medicaid Other | Source: Ambulatory Visit | Attending: Family Medicine | Admitting: Family Medicine

## 2012-01-09 DIAGNOSIS — Z139 Encounter for screening, unspecified: Secondary | ICD-10-CM

## 2012-01-09 DIAGNOSIS — Z1231 Encounter for screening mammogram for malignant neoplasm of breast: Secondary | ICD-10-CM | POA: Insufficient documentation

## 2012-01-16 ENCOUNTER — Encounter: Payer: Self-pay | Admitting: Orthopedic Surgery

## 2012-01-16 ENCOUNTER — Ambulatory Visit (INDEPENDENT_AMBULATORY_CARE_PROVIDER_SITE_OTHER): Payer: Medicaid Other | Admitting: Orthopedic Surgery

## 2012-01-16 ENCOUNTER — Telehealth: Payer: Self-pay | Admitting: Orthopedic Surgery

## 2012-01-16 VITALS — BP 120/60 | Ht 63.0 in | Wt 243.0 lb

## 2012-01-16 DIAGNOSIS — R112 Nausea with vomiting, unspecified: Secondary | ICD-10-CM

## 2012-01-16 DIAGNOSIS — M549 Dorsalgia, unspecified: Secondary | ICD-10-CM

## 2012-01-16 DIAGNOSIS — M1710 Unilateral primary osteoarthritis, unspecified knee: Secondary | ICD-10-CM

## 2012-01-16 MED ORDER — GABAPENTIN 300 MG PO CAPS: 300.0000 mg | ORAL_CAPSULE | Freq: Three times a day (TID) | ORAL | Status: DC

## 2012-01-16 MED ORDER — PROMETHAZINE HCL 25 MG PO TABS: 25.0000 mg | ORAL_TABLET | Freq: Four times a day (QID) | ORAL | Status: DC

## 2012-01-16 NOTE — Telephone Encounter (Signed)
Message copied by Vickki Hearing on Thu Jan 16, 2012  3:44 PM ------      Message from: Cammie Sickle A      Created: Thu Jan 16, 2012  2:25 PM      Regarding: ICD9coding on [place] [id number]       Dr. Romeo Apple,       RE: DOS 01/16/12 for Dawne Casali MRN [id number], the diagnosis codes entered, as well as review of systems, are more like PCP dx codes (nausa and backache).       Services here were knee injection for knee pain, knee arthritis. Please append diagnoses.       Thank you, Okey Regal

## 2012-01-16 NOTE — Patient Instructions (Signed)
You have received a steroid shot. 15% of patients experience increased pain at the injection site with in the next 24 hours. This is best treated with ice and tylenol extra strength 2 tabs every 8 hours. If you are still having pain please call the office.    

## 2012-01-16 NOTE — Progress Notes (Signed)
Subjective:     Patient ID: Rachel [name], female   DOB: 22-Mar-1956, 56 y.o.   MRN: [id number]  Knee Pain    Chief Complaint  Patient presents with  . Knee Pain    left knee pain, requests left knee injection   Status post IM nail LEFT femur many years ago at [facility] and developed osteoarthritis LEFT knee reevaluated for removal of the nail.  Physician's therapist thought it would be too difficult  Continues with medial LEFT knee pain.   Review of Systems Nausea vomiting requests phenergan    Objective:   Physical Exam Physical Exam(6) GENERAL: normal development   CDV: pulses are normal   Skin: normal  Psychiatric: awake, alert and oriented  Neuro: normal sensation  MSK gait unsupported  1 Medial joint line tenderness small joint effusion 2 120 of knee flexion 3 Muscle tone normal 4  Joint stable    Assessment:     Osteoarthritis LEFT knee with pain    Plan:     Inject LEFT knee Knee  Injection Procedure Note  Pre-operative Diagnosis: left knee oa  Post-operative Diagnosis: same  Indications: pain  Anesthesia: ethyl chloride   Procedure Details   Verbal consent was obtained for the procedure. Time out was completed.The joint was prepped with alcohol, followed by  Ethyl chloride spray and A 20 gauge needle was inserted into the knee via lateral approach; 4ml 1% lidocaine and 1 ml of depomedrol  was then injected into the joint . The needle was removed and the area cleansed and dressed.  Complications:  None; patient tolerated the procedure well.

## 2012-03-02 ENCOUNTER — Other Ambulatory Visit: Payer: Self-pay | Admitting: Orthopedic Surgery

## 2012-03-02 DIAGNOSIS — R112 Nausea with vomiting, unspecified: Secondary | ICD-10-CM

## 2012-03-02 MED ORDER — PROMETHAZINE HCL 25 MG PO TABS: 25.0000 mg | ORAL_TABLET | Freq: Four times a day (QID) | ORAL | Status: DC

## 2012-03-03 ENCOUNTER — Other Ambulatory Visit (HOSPITAL_COMMUNITY): Payer: Self-pay | Admitting: Family Medicine

## 2012-03-03 DIAGNOSIS — Z780 Asymptomatic menopausal state: Secondary | ICD-10-CM

## 2012-03-03 DIAGNOSIS — R112 Nausea with vomiting, unspecified: Secondary | ICD-10-CM

## 2012-03-11 ENCOUNTER — Ambulatory Visit (HOSPITAL_COMMUNITY)
Admission: RE | Admit: 2012-03-11 | Discharge: 2012-03-11 | Disposition: A | Discharge status: Home / Self Care | Payer: Medicaid Other | Source: Ambulatory Visit | Attending: Family Medicine | Admitting: Family Medicine

## 2012-03-11 ENCOUNTER — Other Ambulatory Visit (HOSPITAL_COMMUNITY): Payer: Self-pay | Admitting: Family Medicine

## 2012-03-11 DIAGNOSIS — R109 Unspecified abdominal pain: Secondary | ICD-10-CM | POA: Insufficient documentation

## 2012-03-11 DIAGNOSIS — Z780 Asymptomatic menopausal state: Secondary | ICD-10-CM | POA: Insufficient documentation

## 2012-03-11 DIAGNOSIS — Z13820 Encounter for screening for osteoporosis: Secondary | ICD-10-CM | POA: Insufficient documentation

## 2012-03-11 DIAGNOSIS — R112 Nausea with vomiting, unspecified: Secondary | ICD-10-CM

## 2012-04-22 ENCOUNTER — Ambulatory Visit (INDEPENDENT_AMBULATORY_CARE_PROVIDER_SITE_OTHER): Payer: Medicaid Other | Admitting: Orthopedic Surgery

## 2012-04-22 ENCOUNTER — Encounter: Payer: Self-pay | Admitting: Orthopedic Surgery

## 2012-04-22 VITALS — BP 122/70 | Ht 63.0 in | Wt 249.0 lb

## 2012-04-22 DIAGNOSIS — M549 Dorsalgia, unspecified: Secondary | ICD-10-CM

## 2012-04-22 DIAGNOSIS — M179 Osteoarthritis of knee, unspecified: Secondary | ICD-10-CM

## 2012-04-22 DIAGNOSIS — M75120 Complete rotator cuff tear or rupture of unspecified shoulder, not specified as traumatic: Secondary | ICD-10-CM

## 2012-04-22 DIAGNOSIS — M1710 Unilateral primary osteoarthritis, unspecified knee: Secondary | ICD-10-CM

## 2012-04-22 DIAGNOSIS — M75100 Unspecified rotator cuff tear or rupture of unspecified shoulder, not specified as traumatic: Secondary | ICD-10-CM

## 2012-04-22 MED ORDER — GABAPENTIN 300 MG PO CAPS: 300.0000 mg | ORAL_CAPSULE | Freq: Three times a day (TID) | ORAL | Status: DC

## 2012-04-22 NOTE — Patient Instructions (Addendum)
You have received a steroid shot. 15% of patients experience increased pain at the injection site with in the next 24 hours. This is best treated with ice and tylenol extra strength 2 tabs every 8 hours. If you are still having pain please call the office.   You have been scheduled for an MRI scan.  Your insurance company requires advocate precertification prior to scheduling the MRI.  If her MRI scan is not improved we will let you know and make further treatment recommendations according to your insurance's guidelines.   We will call you with the results

## 2012-04-22 NOTE — Progress Notes (Signed)
Patient ID: Rachel [name], female   DOB: 10-30-1955, 56 y.o.   MRN: [id number] Chief Complaint  Patient presents with  . Injections    left knee and    BP 122/70  Ht 5\' 3"  (1.6 m)  Wt 249 lb (112.946 kg)  BMI 44.11 kg/m2  Past Medical History  Diagnosis Date  . Rupture of rotator cuff, complete   . Impingement syndrome of left shoulder   . Shoulder pain   . Subluxation of radial head   . Neoplasm of colon, malignant   . Hip pain, right   . Obesity   . Osteoarthritis   . Medial meniscus tear     left   . Hypertension   . Hyperlipidemia   . Depression   . Anxiety     The patient reports that she cannot raise her RIGHT shoulder without, pain. She's lost motion. He feels weak. She cannot perform activities of daily living. She had a rotator cuff, repair. She's had persistent pain, which I believe, to be caused by her need for cane use with the RIGHT hand.  LEFT knee continues to have, pain. She's been seen at [facility], has a nail in the LEFT femur, which needs to come out. She is reluctant to go back and have advised her that she needs to go back and have him review. This again. She probably needs to have a knee replacement with external alignment guides for the femur or computer navigated replacement.  We went ahead and injected her LEFT knee and RIGHT shoulder and advised her to have an MRI done to evaluate for rotator cuff tear.  Her exam is consistent with rotator cuff dysfunction with painful forward elevation to 90 would not pass that passive range of motion painful arc of motion 90-120. Impingement signs are positive. There is no tenderness or swelling around the posterior shoulder. Some anterior deltoid rotator interval tenderness. She has good neurovascular function to the RIGHT upper extremity.  The LEFT knee is swollen, LEFT foot is swollen. She has some peripheral edema. Pulses are intact. She has arthroscopy portal sites healed over the LEFT knee and she has  medial joint line tenderness, which is quite severe for her today. Her knee remains stable.  Impression #1 rotator cuff dysfunction, possible tear versus impingement.  Impression #2 osteoarthritis, LEFT knee.  Plan MRI RIGHT shoulder.  2 injections were given one in the shoulder and one in the knee   Knee  Injection Procedure Note  Pre-operative Diagnosis: left knee oa  Post-operative Diagnosis: same  Indications: pain  Anesthesia: ethyl chloride   Procedure Details   Verbal consent was obtained for the procedure. Time out was completed.The joint was prepped with alcohol, followed by  Ethyl chloride spray and A 20 gauge needle was inserted into the knee via lateral approach; 4ml 1% lidocaine and 1 ml of depomedrol  was then injected into the joint . The needle was removed and the area cleansed and dressed.  Complications:  None; patient tolerated the procedure well.  Shoulder Injection Procedure Note   Pre-operative Diagnosis: right  RC Syndrome  Post-operative Diagnosis: same  Indications: pain   Anesthesia: ethyl chloride   Procedure Details   Verbal consent was obtained for the procedure. The shoulder was prepped withalcohol and the skin was anesthetized. A 20 gauge needle was advanced into the subacromial space through posterior approach without difficulty  The space was then injected with 3 ml 1% lidocaine and 1 ml of depomedrol. The  injection site was cleansed with isopropyl alcohol and a dressing was applied.  Complications:  None; patient tolerated the procedure well.

## 2012-05-18 ENCOUNTER — Telehealth: Payer: Self-pay | Admitting: Orthopedic Surgery

## 2012-05-18 NOTE — Telephone Encounter (Signed)
Patient called to inquire about  1.  MRI - said she had not ever heard anything about having it scheduled.       Her last office note 04/22/12 shows: "You have been scheduled for an MRI scan. Your insurance company requires advocate precertification prior to scheduling the MRI. I f her MRI scan is not improved we will let you know and make further treatment recommendations according to your insurance's guidelines.  We will call you with the results."   2.  Prednisone - said her knee hurts and wants to know if Dr. Romeo Apple would prescribe this medication.  I explained that due to her [place] Medicaid insurance, and new [place] system, patients are to contact their primary care physician.  We are unable to schedule appointment or order this medicine without primary care authorization.  Patient's ph# is [phone]

## 2012-05-19 ENCOUNTER — Emergency Department (HOSPITAL_COMMUNITY)
Admission: EM | Admit: 2012-05-19 | Discharge: 2012-05-19 | Disposition: A | Discharge status: Home / Self Care | Payer: Medicaid Other | Attending: Emergency Medicine | Admitting: Emergency Medicine

## 2012-05-19 ENCOUNTER — Encounter (HOSPITAL_COMMUNITY): Payer: Self-pay | Admitting: *Deleted

## 2012-05-19 DIAGNOSIS — M25569 Pain in unspecified knee: Secondary | ICD-10-CM | POA: Insufficient documentation

## 2012-05-19 DIAGNOSIS — M25552 Pain in left hip: Secondary | ICD-10-CM

## 2012-05-19 DIAGNOSIS — F341 Dysthymic disorder: Secondary | ICD-10-CM | POA: Insufficient documentation

## 2012-05-19 DIAGNOSIS — M25559 Pain in unspecified hip: Secondary | ICD-10-CM | POA: Insufficient documentation

## 2012-05-19 DIAGNOSIS — I10 Essential (primary) hypertension: Secondary | ICD-10-CM | POA: Insufficient documentation

## 2012-05-19 DIAGNOSIS — G8929 Other chronic pain: Secondary | ICD-10-CM | POA: Insufficient documentation

## 2012-05-19 DIAGNOSIS — E785 Hyperlipidemia, unspecified: Secondary | ICD-10-CM | POA: Insufficient documentation

## 2012-05-19 DIAGNOSIS — Z79899 Other long term (current) drug therapy: Secondary | ICD-10-CM | POA: Insufficient documentation

## 2012-05-19 LAB — BASIC METABOLIC PANEL
Calcium: 8.8 mg/dL (ref 8.4–10.5)
GFR calc Af Amer: >90 mL/min (ref >90)
GFR calc non Af Amer: >90 mL/min (ref >90)
Glucose, Bld: 90 mg/dL (ref 70–99)
Potassium: 3.3 mEq/L — ABNORMAL LOW (ref 3.5–5.1)
Sodium: 138 mEq/L (ref 135–145)

## 2012-05-19 LAB — URINALYSIS, ROUTINE W REFLEX MICROSCOPIC
Leukocytes, UA: NEGATIVE
Nitrite: NEGATIVE
Protein, ur: NEGATIVE mg/dL
Specific Gravity, Urine: 1.02 (ref 1.005–1.030)
Urobilinogen, UA: 0.2 mg/dL (ref 0.0–1.0)

## 2012-05-19 LAB — CBC
Hemoglobin: 11.4 g/dL — ABNORMAL LOW (ref 12.0–15.0)
MCH: 30 pg (ref 26.0–34.0)
MCHC: 34.2 g/dL (ref 30.0–36.0)
Platelets: 217 10*3/uL (ref 150–400)
RDW: 12.6 % (ref 11.5–15.5)

## 2012-05-19 MED ORDER — LORAZEPAM 2 MG/ML IJ SOLN
1.0000 mg | Freq: Once | INTRAMUSCULAR | Status: AC
  Administered 2012-05-19: 1 mg via INTRAVENOUS
  Filled 2012-05-19: qty 1

## 2012-05-19 MED ORDER — HYDROMORPHONE HCL PF 1 MG/ML IJ SOLN
1.0000 mg | Freq: Once | INTRAMUSCULAR | Status: AC
  Administered 2012-05-19: 1 mg via INTRAVENOUS
  Filled 2012-05-19: qty 1

## 2012-05-19 MED ORDER — KETOROLAC TROMETHAMINE 30 MG/ML IJ SOLN
30.0000 mg | Freq: Once | INTRAMUSCULAR | Status: AC
  Administered 2012-05-19: 30 mg via INTRAVENOUS
  Filled 2012-05-19: qty 1

## 2012-05-19 MED ORDER — MORPHINE SULFATE 4 MG/ML IJ SOLN
6.0000 mg | Freq: Once | INTRAMUSCULAR | Status: AC
  Administered 2012-05-19: 6 mg via INTRAVENOUS
  Filled 2012-05-19 (×2): qty 1

## 2012-05-19 MED ORDER — OXYCODONE-ACETAMINOPHEN 5-325 MG PO TABS: 1.0000 | ORAL_TABLET | ORAL | Status: AC | PRN

## 2012-05-19 MED ORDER — SODIUM CHLORIDE 0.9 % IV BOLUS (SEPSIS)
1000.0000 mL | Freq: Once | INTRAVENOUS | Status: AC
Start: 2012-05-19 — End: 2012-05-19
  Administered 2012-05-19: 1000 mL via INTRAVENOUS
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
## 2012-05-19 NOTE — ED Notes (Signed)
Pain in left hip, tearful in triage

## 2012-05-19 NOTE — ED Notes (Signed)
Having trouble ambulating, states meds are not working

## 2012-06-11 ENCOUNTER — Telehealth: Payer: Self-pay | Admitting: Orthopedic Surgery

## 2012-06-11 NOTE — Telephone Encounter (Signed)
Received "urgent" request for medical records of knee, per signed authorization from [facility], ph # [phone] and fax # [phone].  Records related to knee have been faxed as per request (33 pages.)

## 2012-07-29 ENCOUNTER — Ambulatory Visit (INDEPENDENT_AMBULATORY_CARE_PROVIDER_SITE_OTHER): Payer: Medicaid Other | Admitting: Orthopedic Surgery

## 2012-07-29 ENCOUNTER — Encounter: Payer: Self-pay | Admitting: Orthopedic Surgery

## 2012-07-29 ENCOUNTER — Other Ambulatory Visit: Payer: Self-pay | Admitting: *Deleted

## 2012-07-29 VITALS — BP 100/60 | Ht 64.0 in | Wt 247.0 lb

## 2012-07-29 DIAGNOSIS — R112 Nausea with vomiting, unspecified: Secondary | ICD-10-CM

## 2012-07-29 DIAGNOSIS — M6530 Trigger finger, unspecified finger: Secondary | ICD-10-CM

## 2012-07-29 MED ORDER — PROMETHAZINE HCL 25 MG PO TABS: 25.0000 mg | ORAL_TABLET | Freq: Four times a day (QID) | ORAL | Status: DC

## 2012-07-29 NOTE — Progress Notes (Signed)
Patient ID: Rachel [name], female   DOB: [birth year], 56 y.o.   MRN: [id number] Procedure note trigger finger injection Right thumb  Diagnosis trigger finger Postop diagnosis trigger finger Procedure injection of trigger finger Finger injected right thumb  Details of procedure: After verbal consent and timeout to confirm site the RIGHT thumb was injected with 1 cc of 40 mg of Depo-Medrol and 1 cc of 1% lidocaine  The procedure was tolerated well without complication

## 2012-07-29 NOTE — Patient Instructions (Addendum)
You have received a steroid shot. 15% of patients experience increased pain at the injection site with in the next 24 hours. This is best treated with ice and tylenol extra strength 2 tabs every 8 hours. If you are still having pain please call the office.    

## 2012-09-01 ENCOUNTER — Ambulatory Visit (INDEPENDENT_AMBULATORY_CARE_PROVIDER_SITE_OTHER): Payer: Medicaid Other | Admitting: Orthopedic Surgery

## 2012-09-01 ENCOUNTER — Encounter: Payer: Self-pay | Admitting: Orthopedic Surgery

## 2012-09-01 VITALS — Ht 64.0 in | Wt 247.0 lb

## 2012-09-01 DIAGNOSIS — M7050 Other bursitis of knee, unspecified knee: Secondary | ICD-10-CM

## 2012-09-01 DIAGNOSIS — IMO0002 Reserved for concepts with insufficient information to code with codable children: Secondary | ICD-10-CM

## 2012-09-01 NOTE — Patient Instructions (Signed)
You have received a steroid shot. 15% of patients experience increased pain at the injection site with in the next 24 hours. This is best treated with ice and tylenol extra strength 2 tabs every 8 hours. If you are still having pain please call the office.    

## 2012-09-01 NOTE — Progress Notes (Signed)
Patient ID: Rachel [name], female   DOB: [birth year], 55 y.o.   MRN: [id number] Chief Complaint  Patient presents with  . Follow-up    Recheck right knee with injection.    The patient requests injection in the left knee  She has medial pain in the left knee over the bursal tissue  We injected the bursal tissue  Followup as needed.  Knee  Injection Procedure Note  Pre-operative Diagnosis: left knee bursitis  Post-operative Diagnosis: same  Indications: pain  Anesthesia: ethyl chloride   Procedure Details   Verbal consent was obtained for the procedure. Time out was completed.The burs  was prepped with alcohol, followed by  Ethyl chloride spray and A 25 gauge needle was inserted into the pes bursa via lateral approach; 4ml 1% lidocaine and 1 ml of depomedrol  was then injected into the joint . The needle was removed and the area cleansed and dressed.  Complications:  None; patient tolerated the procedure well.

## 2012-09-17 ENCOUNTER — Ambulatory Visit (INDEPENDENT_AMBULATORY_CARE_PROVIDER_SITE_OTHER): Payer: Medicaid Other | Admitting: Otolaryngology

## 2012-09-17 DIAGNOSIS — D140 Benign neoplasm of middle ear, nasal cavity and accessory sinuses: Secondary | ICD-10-CM

## 2012-10-02 ENCOUNTER — Encounter (HOSPITAL_BASED_OUTPATIENT_CLINIC_OR_DEPARTMENT_OTHER): Payer: Self-pay | Admitting: *Deleted

## 2012-10-02 NOTE — Progress Notes (Signed)
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

## 2012-10-06 ENCOUNTER — Encounter (HOSPITAL_BASED_OUTPATIENT_CLINIC_OR_DEPARTMENT_OTHER): Payer: Self-pay | Admitting: Anesthesiology

## 2012-10-06 ENCOUNTER — Ambulatory Visit (HOSPITAL_BASED_OUTPATIENT_CLINIC_OR_DEPARTMENT_OTHER)
Admission: RE | Admit: 2012-10-06 | Discharge: 2012-10-06 | Disposition: A | Discharge status: Home / Self Care | Payer: Medicaid Other | Source: Ambulatory Visit | Attending: Otolaryngology | Admitting: Otolaryngology

## 2012-10-06 ENCOUNTER — Encounter (HOSPITAL_BASED_OUTPATIENT_CLINIC_OR_DEPARTMENT_OTHER): Payer: Self-pay | Admitting: *Deleted

## 2012-10-06 ENCOUNTER — Encounter (HOSPITAL_BASED_OUTPATIENT_CLINIC_OR_DEPARTMENT_OTHER)
Admission: RE | Disposition: A | Discharge status: Home / Self Care | Payer: Self-pay | Source: Ambulatory Visit | Attending: Otolaryngology

## 2012-10-06 ENCOUNTER — Ambulatory Visit (HOSPITAL_BASED_OUTPATIENT_CLINIC_OR_DEPARTMENT_OTHER): Payer: Medicaid Other | Admitting: Anesthesiology

## 2012-10-06 DIAGNOSIS — J3489 Other specified disorders of nose and nasal sinuses: Secondary | ICD-10-CM

## 2012-10-06 DIAGNOSIS — D140 Benign neoplasm of middle ear, nasal cavity and accessory sinuses: Secondary | ICD-10-CM | POA: Insufficient documentation

## 2012-10-06 HISTORY — PX: NASAL SINUS SURGERY: SHX719

## 2012-10-06 LAB — POCT I-STAT, CHEM 8
Creatinine, Ser: 0.7 mg/dL (ref 0.50–1.10)
HCT: 35 % — ABNORMAL LOW (ref 36.0–46.0)
Hemoglobin: 11.9 g/dL — ABNORMAL LOW (ref 12.0–15.0)
Potassium: 3.8 mEq/L (ref 3.5–5.1)
Sodium: 142 mEq/L (ref 135–145)
TCO2: 28 mmol/L (ref 0–100)

## 2012-10-06 SURGERY — SINUS SURGERY, ENDOSCOPIC
Anesthesia: General | Site: Nose | Laterality: Right | Wound class: Clean Contaminated

## 2012-10-06 MED ORDER — OXYCODONE HCL 5 MG PO TABS
5.0000 mg | ORAL_TABLET | Freq: Once | ORAL | Status: AC
  Administered 2012-10-06: 5 mg via ORAL

## 2012-10-06 MED ORDER — LACTATED RINGERS IV SOLN
INTRAVENOUS | Status: DC
Start: 2012-10-06 — End: 2012-10-06
  Administered 2012-10-06 (×2): via INTRAVENOUS

## 2012-10-06 MED ORDER — OXYMETAZOLINE HCL 0.05 % NA SOLN
NASAL | Status: DC | PRN
  Administered 2012-10-06: 1 via NASAL

## 2012-10-06 MED ORDER — FENTANYL CITRATE 0.05 MG/ML IJ SOLN
INTRAMUSCULAR | Status: DC | PRN
  Administered 2012-10-06: 100 ug via INTRAVENOUS

## 2012-10-06 MED ORDER — MIDAZOLAM HCL 2 MG/2ML IJ SOLN: 1.0000 mg | INTRAMUSCULAR | Status: DC | PRN

## 2012-10-06 MED ORDER — PROPOFOL 10 MG/ML IV BOLUS
INTRAVENOUS | Status: DC | PRN
  Administered 2012-10-06: 40 mg via INTRAVENOUS
  Administered 2012-10-06: 160 mg via INTRAVENOUS

## 2012-10-06 MED ORDER — AMOXICILLIN-POT CLAVULANATE 875-125 MG PO TABS: 1.0000 | ORAL_TABLET | Freq: Two times a day (BID) | ORAL | Status: AC

## 2012-10-06 MED ORDER — ONDANSETRON HCL 4 MG/2ML IJ SOLN
INTRAMUSCULAR | Status: DC | PRN
  Administered 2012-10-06: 4 mg via INTRAVENOUS

## 2012-10-06 MED ORDER — OXYCODONE-ACETAMINOPHEN 5-325 MG PO TABS
1.0000 | ORAL_TABLET | ORAL | Status: DC | PRN
Start: 2012-10-06 — End: 2012-10-25

## 2012-10-06 MED ORDER — ACETAMINOPHEN 10 MG/ML IV SOLN: 1000.0000 mg | Freq: Once | INTRAVENOUS | Status: DC | PRN

## 2012-10-06 MED ORDER — DEXAMETHASONE SODIUM PHOSPHATE 4 MG/ML IJ SOLN
INTRAMUSCULAR | Status: DC | PRN
  Administered 2012-10-06: 10 mg via INTRAVENOUS

## 2012-10-06 MED ORDER — MUPIROCIN 2 % EX OINT
TOPICAL_OINTMENT | CUTANEOUS | Status: DC | PRN
  Administered 2012-10-06: 1 via NASAL

## 2012-10-06 MED ORDER — ONDANSETRON HCL 4 MG/2ML IJ SOLN: 4.0000 mg | Freq: Once | INTRAMUSCULAR | Status: DC | PRN

## 2012-10-06 MED ORDER — MIDAZOLAM HCL 5 MG/5ML IJ SOLN
INTRAMUSCULAR | Status: DC | PRN
  Administered 2012-10-06: 2 mg via INTRAVENOUS

## 2012-10-06 MED ORDER — HYDROMORPHONE HCL PF 1 MG/ML IJ SOLN
0.2500 mg | INTRAMUSCULAR | Status: DC | PRN
  Administered 2012-10-06 (×2): 0.5 mg via INTRAVENOUS

## 2012-10-06 MED ORDER — FENTANYL CITRATE 0.05 MG/ML IJ SOLN: 50.0000 ug | INTRAMUSCULAR | Status: DC | PRN

## 2012-10-06 MED ORDER — SUCCINYLCHOLINE CHLORIDE 20 MG/ML IJ SOLN
INTRAMUSCULAR | Status: DC | PRN
  Administered 2012-10-06: 100 mg via INTRAVENOUS

## 2012-10-06 SURGICAL SUPPLY — 54 items
ATTRACTOMAT 16X20 MAGNETIC DRP (DRAPES) IMPLANT
BLADE RAD40 ROTATE 4M 4 5PK (BLADE) IMPLANT
BLADE RAD60 ROTATE M4 4 5PK (BLADE) IMPLANT
BLADE ROTATE RAD12 5PK M4 4MM (BLADE) IMPLANT
BLADE TRICUT ROTATE M4 4 5PK (BLADE) IMPLANT
BUR HS RAD FRONTAL 3 (BURR) IMPLANT
CANISTER SUC SOCK COL 7 IN (MISCELLANEOUS) ×2 IMPLANT
CANISTER SUCTION 1200CC (MISCELLANEOUS) ×2 IMPLANT
CATH SINUS BALLN 7X16 (CATHETERS) IMPLANT
CATH SINUS BALLN RELIEV 6X16 (SINUPLASTY) IMPLANT
CATH SINUS GUIDE F-70 (CATHETERS) IMPLANT
CATH SINUS GUIDE M/110 (CATHETERS) IMPLANT
CATH SINUS IRRIGATION 2.0 (CATHETERS) IMPLANT
CLOTH BEACON ORANGE TIMEOUT ST (SAFETY) ×2 IMPLANT
COAGULATOR SUCT 8FR VV (MISCELLANEOUS) IMPLANT
COAGULATOR SUCT SWTCH 10FR 6 (ELECTROSURGICAL) IMPLANT
DECANTER SPIKE VIAL GLASS SM (MISCELLANEOUS) IMPLANT
DEVICE INFLATION 20/61 (MISCELLANEOUS) IMPLANT
DRSG NASAL KENNEDY LMNT 8CM (GAUZE/BANDAGES/DRESSINGS) IMPLANT
DRSG NASOPORE 8CM (GAUZE/BANDAGES/DRESSINGS) ×1 IMPLANT
DRSG TELFA 3X8 NADH (GAUZE/BANDAGES/DRESSINGS) IMPLANT
ELECT REM PT RETURN 9FT ADLT (ELECTROSURGICAL)
ELECTRODE REM PT RTRN 9FT ADLT (ELECTROSURGICAL) IMPLANT
GLOVE BIO SURGEON STRL SZ7.5 (GLOVE) ×2 IMPLANT
GLOVE SKINSENSE NS SZ7.0 (GLOVE) ×1
GLOVE SKINSENSE STRL SZ7.0 (GLOVE) IMPLANT
GOWN PREVENTION PLUS XLARGE (GOWN DISPOSABLE) ×3 IMPLANT
HANDLE SINUS GUIDE LP (INSTRUMENTS) IMPLANT
HANDPIECE HYDRODEBRIDER FRONT (BLADE) IMPLANT
HEMOSTAT SURGICEL 2X14 (HEMOSTASIS) IMPLANT
IV NS 500ML (IV SOLUTION) ×2
IV NS 500ML BAXH (IV SOLUTION) ×1 IMPLANT
NDL SPNL 25GX3.5 QUINCKE BL (NEEDLE) IMPLANT
NEEDLE 27GAX1X1/2 (NEEDLE) ×2 IMPLANT
NEEDLE SPNL 25GX3.5 QUINCKE BL (NEEDLE) IMPLANT
NS IRRIG 1000ML POUR BTL (IV SOLUTION) IMPLANT
PACK BASIN DAY SURGERY FS (CUSTOM PROCEDURE TRAY) ×2 IMPLANT
PACK ENT DAY SURGERY (CUSTOM PROCEDURE TRAY) ×2 IMPLANT
PAD DRESSING TELFA 3X8 NADH (GAUZE/BANDAGES/DRESSINGS) IMPLANT
SET EXT MALE ROTATING LL 32IN (MISCELLANEOUS) IMPLANT
SET IV EXT TUBING FEMALE 31 (MISCELLANEOUS) IMPLANT
SLEEVE SCD COMPRESS KNEE MED (MISCELLANEOUS) IMPLANT
SOLUTION BUTLER CLEAR DIP (MISCELLANEOUS) ×3 IMPLANT
SPLINT NASAL DOYLE BI-VL (GAUZE/BANDAGES/DRESSINGS) IMPLANT
SPONGE GAUZE 2X2 8PLY STRL LF (GAUZE/BANDAGES/DRESSINGS) ×2 IMPLANT
SPONGE NEURO XRAY DETECT 1X3 (DISPOSABLE) ×2 IMPLANT
SUT ETHILON 3 0 PS 1 (SUTURE) IMPLANT
SUT PLAIN 4 0 ~~LOC~~ 1 (SUTURE) IMPLANT
SYSTEM HYDRODEBRIDER (MISCELLANEOUS) IMPLANT
SYSTEM RELIEVA LUMA ILLUM (SINUPLASTY) IMPLANT
TOWEL OR 17X24 6PK STRL BLUE (TOWEL DISPOSABLE) ×2 IMPLANT
TUBE CONNECTING 20X1/4 (TUBING) ×1 IMPLANT
WATER STERILE IRR 1000ML POUR (IV SOLUTION) ×2 IMPLANT
YANKAUER SUCT BULB TIP NO VENT (SUCTIONS) IMPLANT

## 2012-10-06 NOTE — Anesthesia Preprocedure Evaluation (Signed)
Anesthesia Evaluation  Patient identified by MRN, date of birth, ID band Patient awake    Reviewed: Allergy & Precautions, H&P , NPO status , Patient's Chart, lab work & pertinent test results, reviewed documented beta blocker date and time   Airway Mallampati: II      Dental  (+) Teeth Intact and Dental Advisory Given   Pulmonary  breath sounds clear to auscultation        Cardiovascular Rhythm:Regular Rate:Normal     Neuro/Psych    GI/Hepatic   Endo/Other    Renal/GU      Musculoskeletal   Abdominal (+) + obese,   Peds  Hematology   Anesthesia Other Findings   Reproductive/Obstetrics                           Anesthesia Physical Anesthesia Plan  ASA: II  Anesthesia Plan: General   Post-op Pain Management:    Induction: Intravenous  Airway Management Planned: Oral ETT  Additional Equipment:   Intra-op Plan:   Post-operative Plan: Extubation in OR  Informed Consent: I have reviewed the patients History and Physical, chart, labs and discussed the procedure including the risks, benefits and alternatives for the proposed anesthesia with the patient or authorized representative who has indicated his/her understanding and acceptance.   Dental advisory given  Plan Discussed with: CRNA and Surgeon  Anesthesia Plan Comments:         Anesthesia Quick Evaluation

## 2012-10-06 NOTE — Transfer of Care (Signed)
Immediate Anesthesia Transfer of Care Note  Patient: Rachel [name]  Procedure(s) Performed: Procedure(s) (LRB) with comments: ENDOSCOPIC SINUS SURGERY (Right) - Endoscopic Removal of  Right Nasal Mass  Patient Location: PACU  Anesthesia Type:General  Level of Consciousness: awake, alert  and oriented  Airway & Oxygen Therapy: Patient Spontanous Breathing and Patient connected to face mask oxygen  Post-op Assessment: Report given to PACU RN and Post -op Vital signs reviewed and stable  Post vital signs: Reviewed and stable  Complications: No apparent anesthesia complications

## 2012-10-06 NOTE — H&P (Signed)
  H&P Update  Pt's original H&P dated 09/17/12 reviewed and placed in chart (to be scanned).  I personally examined the patient today.  No change in health. Proceed with endoscopic removal of nasal mass.

## 2012-10-06 NOTE — Brief Op Note (Signed)
10/06/2012  1:05 PM  PATIENT:  Adelina Mings  56 y.o. female  PRE-OPERATIVE DIAGNOSIS:  nasal mass-Right  POST-OPERATIVE DIAGNOSIS:  Nasal Mass-Right  PROCEDURE:  Procedure(s) (LRB) with comments: Endoscopic excisional biopsy of right nasal mass  SURGEON:  Surgeon(s) and Role:    * Sui W Deklen Popelka, MD - Primary  PHYSICIAN ASSISTANT:   ASSISTANTS: none   ANESTHESIA:   general  EBL:  Total I/O In: 50 [I.V.:50] Out: -   BLOOD ADMINISTERED:none  DRAINS: none   LOCAL MEDICATIONS USED:  NONE  SPECIMEN:  Source of Specimen:  Right nasal mass  DISPOSITION OF SPECIMEN:  PATHOLOGY  COUNTS:  YES  TOURNIQUET:  * No tourniquets in log *  DICTATION: Dictated.  PLAN OF CARE: Discharge to home after PACU  PATIENT DISPOSITION:  PACU - hemodynamically stable.   Delay start of Pharmacological VTE agent (>24hrs) due to surgical blood loss or risk of bleeding: not applicable

## 2012-10-06 NOTE — Anesthesia Procedure Notes (Signed)
Procedure Name: Intubation Date/Time: 10/06/2012 12:43 PM Performed by: Burna Cash Pre-anesthesia Checklist: Patient identified, Emergency Drugs available, Suction available and Patient being monitored Patient Re-evaluated:Patient Re-evaluated prior to inductionOxygen Delivery Method: Circle System Utilized Preoxygenation: Pre-oxygenation with 100% oxygen Intubation Type: IV induction Ventilation: Mask ventilation without difficulty Laryngoscope Size: 3 Grade View: Grade III Tube type: Oral Tube size: 7.0 mm Number of attempts: 1 Airway Equipment and Method: stylet and oral airway Placement Confirmation: ETT inserted through vocal cords under direct vision,  positive ETCO2 and breath sounds checked- equal and bilateral Secured at: 21 cm Tube secured with: Tape Dental Injury: Teeth and Oropharynx as per pre-operative assessment

## 2012-10-06 NOTE — Anesthesia Postprocedure Evaluation (Signed)
  Anesthesia Post-op Note  Patient: Rachel [name]  Procedure(s) Performed: Procedure(s) (LRB) with comments: ENDOSCOPIC SINUS SURGERY (Right) - Endoscopic Removal of  Right Nasal Mass  Patient Location: PACU  Anesthesia Type:General  Level of Consciousness: awake, alert  and oriented  Airway and Oxygen Therapy: Patient Spontanous Breathing and Patient connected to nasal cannula oxygen  Post-op Pain: mild  Post-op Assessment: Post-op Vital signs reviewed, Patient's Cardiovascular Status Stable, Respiratory Function Stable and Patent Airway  Post-op Vital Signs: stable  Complications: No apparent anesthesia complications

## 2012-10-07 ENCOUNTER — Encounter (HOSPITAL_BASED_OUTPATIENT_CLINIC_OR_DEPARTMENT_OTHER): Payer: Self-pay | Admitting: Otolaryngology

## 2012-10-07 NOTE — Op Note (Signed)
[name], [name]           ACCOUNT NO.:  [id number]  MEDICAL RECORD NO.:  [id number]  LOCATION:                                 FACILITY:  PHYSICIAN:  Newman Pies, MD            DATE OF BIRTH:  1956/08/05  DATE OF PROCEDURE:  10/06/2012 DATE OF DISCHARGE:                              OPERATIVE REPORT   PREOPERATIVE DIAGNOSIS:  Right nasal mass.  POSTOPERATIVE DIAGNOSIS:  Right nasal mass.  PROCEDURE PERFORMED:  Endoscopic excisional biopsy of the right nasal mass.  ANESTHESIA:  General endotracheal tube anesthesia.  COMPLICATIONS:  None.  ESTIMATED BLOOD LOSS:  Minimal.  INDICATION FOR PROCEDURE:  The patient is a 56 year old female with a previous history of right nasal fibroma, status post surgical resection. Over the past several months, the patient complains that her right nasal congestion and obstructions have increased.  In addition, she also complains of pain over the right nasal dorsum.  On examination, the patient was noted to have a large right nasal mass, nearly completely obstructing the right nasal cavity.  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  No purulent drainage was noted at that time.  Based on the above findings, the decision was made for the patient to undergo revision surgical excision of the right nasal mass. The risks, benefits, alternatives, and details of the procedure were discussed with the patient.  Questions were invited and answered. Informed consent was obtained.  DESCRIPTION:  The patient was taken to the operating room and placed supine on the operating table.  General endotracheal tube anesthesia was administered by the anesthesiologist.  The patient was positioned and prepped and draped in the standard fashion for nasal surgery.  Pledgets soaked with Afrin were placed in the right nasal cavity.  The pledgets were subsequently removed.  With a 0 degree endoscope, the right nasal cavity was examined.  A large  firm mass was noted to occupy most of the right nasal cavity.  The mass was carefully removed in a piecemeal fashion with a combination of Tru-Cut forceps and Blakesley forceps. The nasal mass appears to be adjacent to the right lateral nasal wall. The superior portion of the nasal cavity was also noted to be significantly calcified.  No purulent drainage or surface ulceration was noted.  The entire mass was then sent to the Pathology department for permanent histologic identification.  Hemostasis was achieved with Nasopore packing.  The care of the patient was turned over to the anesthesiologist.  The patient was awakened from anesthesia without difficulty.  She was extubated and transferred to the recovery room in good condition.  OPERATIVE FINDINGS:  Large calcified right nasal mass was noted.  It was removed and sent to the Pathology Department for histologic identification.  SPECIMEN:  Right nasal mass.  FOLLOWUP CARE:  The patient will be discharged home once she is awake and alert.  She will follow up in my office in 1 week for repeat debridement of the nasal cavity.     Newman Pies, MD     ST/MEDQ  D:  10/07/2012  T:  10/07/2012  Job:  [id number]

## 2012-10-15 ENCOUNTER — Ambulatory Visit (INDEPENDENT_AMBULATORY_CARE_PROVIDER_SITE_OTHER): Payer: Medicaid Other | Admitting: Otolaryngology

## 2012-10-15 DIAGNOSIS — D140 Benign neoplasm of middle ear, nasal cavity and accessory sinuses: Secondary | ICD-10-CM

## 2012-10-25 ENCOUNTER — Encounter (HOSPITAL_COMMUNITY): Payer: Self-pay

## 2012-10-25 ENCOUNTER — Emergency Department (HOSPITAL_COMMUNITY)
Admission: EM | Admit: 2012-10-25 | Discharge: 2012-10-25 | Disposition: A | Discharge status: Home / Self Care | Payer: Medicaid Other | Attending: Emergency Medicine | Admitting: Emergency Medicine

## 2012-10-25 DIAGNOSIS — M1710 Unilateral primary osteoarthritis, unspecified knee: Secondary | ICD-10-CM | POA: Insufficient documentation

## 2012-10-25 DIAGNOSIS — I10 Essential (primary) hypertension: Secondary | ICD-10-CM | POA: Insufficient documentation

## 2012-10-25 DIAGNOSIS — Z79899 Other long term (current) drug therapy: Secondary | ICD-10-CM | POA: Insufficient documentation

## 2012-10-25 DIAGNOSIS — F3289 Other specified depressive episodes: Secondary | ICD-10-CM | POA: Insufficient documentation

## 2012-10-25 DIAGNOSIS — Z96649 Presence of unspecified artificial hip joint: Secondary | ICD-10-CM | POA: Insufficient documentation

## 2012-10-25 DIAGNOSIS — Z9889 Other specified postprocedural states: Secondary | ICD-10-CM | POA: Insufficient documentation

## 2012-10-25 DIAGNOSIS — E669 Obesity, unspecified: Secondary | ICD-10-CM | POA: Insufficient documentation

## 2012-10-25 DIAGNOSIS — IMO0002 Reserved for concepts with insufficient information to code with codable children: Secondary | ICD-10-CM | POA: Insufficient documentation

## 2012-10-25 DIAGNOSIS — E785 Hyperlipidemia, unspecified: Secondary | ICD-10-CM | POA: Insufficient documentation

## 2012-10-25 DIAGNOSIS — M1990 Unspecified osteoarthritis, unspecified site: Secondary | ICD-10-CM

## 2012-10-25 DIAGNOSIS — F329 Major depressive disorder, single episode, unspecified: Secondary | ICD-10-CM | POA: Insufficient documentation

## 2012-10-25 DIAGNOSIS — F411 Generalized anxiety disorder: Secondary | ICD-10-CM | POA: Insufficient documentation

## 2012-10-25 MED ORDER — HYDROCODONE-ACETAMINOPHEN 5-325 MG PO TABS
2.0000 | ORAL_TABLET | Freq: Once | ORAL | Status: AC
  Administered 2012-10-25: 2 via ORAL
  Filled 2012-10-25: qty 2

## 2012-10-25 MED ORDER — KETOROLAC TROMETHAMINE 10 MG PO TABS
10.0000 mg | ORAL_TABLET | Freq: Once | ORAL | Status: AC
  Administered 2012-10-25: 10 mg via ORAL
  Filled 2012-10-25: qty 1

## 2012-10-25 MED ORDER — HYDROCODONE-ACETAMINOPHEN 5-325 MG PO TABS: 1.0000 | ORAL_TABLET | ORAL | Status: AC | PRN

## 2012-10-25 MED ORDER — PROMETHAZINE HCL 12.5 MG PO TABS
12.5000 mg | ORAL_TABLET | Freq: Once | ORAL | Status: AC
  Administered 2012-10-25: 12.5 mg via ORAL
  Filled 2012-10-25: qty 1

## 2012-10-25 MED ORDER — MELOXICAM 7.5 MG PO TABS: ORAL_TABLET | ORAL | Status: DC
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
## 2012-10-25 NOTE — ED Notes (Signed)
Pt reports right leg pain for 2 months, moved the wrong way and cont. To hurt.  Left knee pain for years.

## 2012-10-25 NOTE — ED Notes (Signed)
Pt presents with chronic left knee pain, generally sees dr Romeo Apple to manage her chronic knee pain. No swelling in knee noted. Pt states pain radiates into back of leg. Pulses present at this time with brisk cap refill distal to level of pain. NAD noted.

## 2012-10-25 NOTE — ED Notes (Signed)
Pt assisted to bathroom in wheelchair. Pt generally walks with a cane, however states pain is intense tonight.

## 2012-10-26 NOTE — ED Provider Notes (Signed)
Medical screening examination/treatment/procedure(s) were performed by non-physician practitioner and as supervising physician I was immediately available for consultation/collaboration.  Emilee Market, MD 10/26/12 2054 

## 2012-10-29 ENCOUNTER — Ambulatory Visit (INDEPENDENT_AMBULATORY_CARE_PROVIDER_SITE_OTHER): Payer: Medicaid Other | Admitting: Otolaryngology

## 2012-11-05 ENCOUNTER — Ambulatory Visit (INDEPENDENT_AMBULATORY_CARE_PROVIDER_SITE_OTHER): Payer: Medicaid Other | Admitting: Otolaryngology

## 2012-11-05 DIAGNOSIS — D140 Benign neoplasm of middle ear, nasal cavity and accessory sinuses: Secondary | ICD-10-CM

## 2012-11-05 DIAGNOSIS — J320 Chronic maxillary sinusitis: Secondary | ICD-10-CM

## 2012-12-03 ENCOUNTER — Ambulatory Visit (INDEPENDENT_AMBULATORY_CARE_PROVIDER_SITE_OTHER): Payer: Medicaid Other | Admitting: Otolaryngology

## 2012-12-03 DIAGNOSIS — J320 Chronic maxillary sinusitis: Secondary | ICD-10-CM

## 2012-12-03 DIAGNOSIS — R040 Epistaxis: Secondary | ICD-10-CM

## 2012-12-03 DIAGNOSIS — H6120 Impacted cerumen, unspecified ear: Secondary | ICD-10-CM

## 2012-12-03 DIAGNOSIS — J330 Polyp of nasal cavity: Secondary | ICD-10-CM

## 2013-01-06 ENCOUNTER — Ambulatory Visit (INDEPENDENT_AMBULATORY_CARE_PROVIDER_SITE_OTHER): Payer: Medicaid Other | Admitting: Orthopedic Surgery

## 2013-01-06 ENCOUNTER — Encounter: Payer: Self-pay | Admitting: Orthopedic Surgery

## 2013-01-06 VITALS — BP 142/60 | Ht 64.0 in | Wt 247.0 lb

## 2013-01-06 DIAGNOSIS — M549 Dorsalgia, unspecified: Secondary | ICD-10-CM

## 2013-01-06 DIAGNOSIS — M7051 Other bursitis of knee, right knee: Secondary | ICD-10-CM

## 2013-01-06 DIAGNOSIS — M1710 Unilateral primary osteoarthritis, unspecified knee: Secondary | ICD-10-CM

## 2013-01-06 DIAGNOSIS — M76899 Other specified enthesopathies of unspecified lower limb, excluding foot: Secondary | ICD-10-CM

## 2013-01-06 MED ORDER — GABAPENTIN 300 MG PO CAPS: 300.0000 mg | ORAL_CAPSULE | Freq: Three times a day (TID) | ORAL | Status: DC

## 2013-01-06 NOTE — Patient Instructions (Signed)
You have received a steroid shot. 15% of patients experience increased pain at the injection site with in the next 24 hours. This is best treated with ice and tylenol extra strength 2 tabs every 8 hours. If you are still having pain please call the office.    

## 2013-01-06 NOTE — Progress Notes (Signed)
Chief Complaint  Patient presents with  . Knee Pain    Bilateral knee pain    BP 142/60  Ht 5\' 4"  (1.626 m)  Wt 247 lb (112.038 kg)  BMI 42.38 kg/m2  History the patient has had a left femoral nail over 20 years ago she is followed at [facility] for possible total knee replacement complains of left knee pain chronically consistent with osteoarthrosis  We are not really following her for that  She also comes in today complaining of right knee pain after injuring her knee with a twisting injury about 2 months ago. She went to the ER no x-ray was done not felt necessary. She complains of pain over the medial pes bursa and medial calf.  She does not complain of joint pain or joint swelling. Does not complaining of catching locking or giving way  Review of systems she has chronic pain, she denies fever or chills denies numbness or tingling does complain of some unsteady gait  Exam Blood pressure 142/60, height 5\' 4"  (1.626 m), weight 247 lb (112.038 kg). General she is somewhat obese. She is oriented x3 her mood is normal her ambulation supported by a cane  The right knee is tender over the bursa range of motion passively is 115 knee is stable motor exam is normal skin is intact  Left knee medial and lateral joint line tenderness restricted range of motion. Collaterals are stable cruciates are stable muscle tone and muscle exam normal skin is intact  Diagnosis right knee bursal inflammation  Left knee osteoarthritis  Plan inject right knee bursa and injected left knee joint  Refill Neurontin. Follow up as needed  Knee  Injection Procedure Note  Pre-operative Diagnosis: left knee oa  Post-operative Diagnosis: same  Indications: pain  Anesthesia: ethyl chloride   Procedure Details   Verbal consent was obtained for the procedure. Time out was completed.The joint was prepped with alcohol, followed by  Ethyl chloride spray and A 20 gauge needle was inserted into the knee  via lateral approach; 4ml 1% lidocaine and 1 ml of depomedrol  was then injected into the joint . The needle was removed and the area cleansed and dressed.  Complications:  None; patient tolerated the procedure well.  Knee  Injection Procedure Note  Pre-operative Diagnosis: left knee oa  Post-operative Diagnosis: same  Indications: pain  Anesthesia: ethyl chloride   Procedure Details   Verbal consent was obtained for the procedure. Time out was completed.The joint was prepped with alcohol, followed by  Ethyl chloride spray and A 20 gauge needle was inserted into the knee via lateral approach; 4ml 1% lidocaine and 1 ml of depomedrol  was then injected into the joint . The needle was removed and the area cleansed and dressed.  Complications:  None; patient tolerated the procedure well. Knee  Injection Procedure Note  Pre-operative Diagnosis: right knee bursitis Post-operative Diagnosis: same  Indications: pain  Anesthesia: ethyl chloride   Procedure Details   Verbal consent was obtained for the procedure. Time out was completed.The area was prepped with alcohol, followed by  Ethyl chloride spray and a 25-gauge needle was used to inject the bursa Complications:  None; patient tolerated the procedure well.

## 2013-01-18 ENCOUNTER — Other Ambulatory Visit (HOSPITAL_COMMUNITY): Payer: Self-pay | Admitting: Family Medicine

## 2013-01-18 DIAGNOSIS — Z139 Encounter for screening, unspecified: Secondary | ICD-10-CM

## 2013-01-22 ENCOUNTER — Ambulatory Visit (HOSPITAL_COMMUNITY): Payer: Medicaid Other

## 2013-02-04 ENCOUNTER — Ambulatory Visit (INDEPENDENT_AMBULATORY_CARE_PROVIDER_SITE_OTHER): Payer: Medicaid Other | Admitting: Otolaryngology

## 2013-02-04 DIAGNOSIS — D140 Benign neoplasm of middle ear, nasal cavity and accessory sinuses: Secondary | ICD-10-CM

## 2013-02-25 ENCOUNTER — Emergency Department (HOSPITAL_COMMUNITY): Payer: Medicaid Other

## 2013-02-25 ENCOUNTER — Encounter (HOSPITAL_COMMUNITY): Payer: Self-pay | Admitting: *Deleted

## 2013-02-25 ENCOUNTER — Emergency Department (HOSPITAL_COMMUNITY)
Admission: EM | Admit: 2013-02-25 | Discharge: 2013-02-25 | Disposition: A | Discharge status: Home / Self Care | Payer: Medicaid Other | Attending: Emergency Medicine | Admitting: Emergency Medicine

## 2013-02-25 DIAGNOSIS — F329 Major depressive disorder, single episode, unspecified: Secondary | ICD-10-CM | POA: Insufficient documentation

## 2013-02-25 DIAGNOSIS — F411 Generalized anxiety disorder: Secondary | ICD-10-CM | POA: Insufficient documentation

## 2013-02-25 DIAGNOSIS — Z79899 Other long term (current) drug therapy: Secondary | ICD-10-CM | POA: Insufficient documentation

## 2013-02-25 DIAGNOSIS — M25519 Pain in unspecified shoulder: Secondary | ICD-10-CM | POA: Insufficient documentation

## 2013-02-25 DIAGNOSIS — I10 Essential (primary) hypertension: Secondary | ICD-10-CM | POA: Insufficient documentation

## 2013-02-25 DIAGNOSIS — M542 Cervicalgia: Secondary | ICD-10-CM | POA: Insufficient documentation

## 2013-02-25 DIAGNOSIS — F3289 Other specified depressive episodes: Secondary | ICD-10-CM | POA: Insufficient documentation

## 2013-02-25 DIAGNOSIS — Z8739 Personal history of other diseases of the musculoskeletal system and connective tissue: Secondary | ICD-10-CM | POA: Insufficient documentation

## 2013-02-25 DIAGNOSIS — E785 Hyperlipidemia, unspecified: Secondary | ICD-10-CM | POA: Insufficient documentation

## 2013-02-25 DIAGNOSIS — R079 Chest pain, unspecified: Secondary | ICD-10-CM | POA: Insufficient documentation

## 2013-02-25 DIAGNOSIS — Z87828 Personal history of other (healed) physical injury and trauma: Secondary | ICD-10-CM | POA: Insufficient documentation

## 2013-02-25 DIAGNOSIS — R0602 Shortness of breath: Secondary | ICD-10-CM | POA: Insufficient documentation

## 2013-02-25 DIAGNOSIS — E669 Obesity, unspecified: Secondary | ICD-10-CM | POA: Insufficient documentation

## 2013-02-25 LAB — BASIC METABOLIC PANEL
BUN: 21 mg/dL (ref 6–23)
Calcium: 9 mg/dL (ref 8.4–10.5)
Chloride: 99 mEq/L (ref 96–112)
Creatinine, Ser: 1.17 mg/dL — ABNORMAL HIGH (ref 0.50–1.10)
GFR calc Af Amer: 58 mL/min — ABNORMAL LOW (ref >90)

## 2013-02-25 LAB — CBC WITH DIFFERENTIAL/PLATELET
Basophils Absolute: 0 10*3/uL (ref 0.0–0.1)
Basophils Relative: 0 % (ref 0–1)
Eosinophils Relative: 2 % (ref 0–5)
HCT: 34.4 % — ABNORMAL LOW (ref 36.0–46.0)
MCH: 30.6 pg (ref 26.0–34.0)
MCHC: 35.2 g/dL (ref 30.0–36.0)
MCV: 87.1 fL (ref 78.0–100.0)
Monocytes Absolute: 0.7 10*3/uL (ref 0.1–1.0)
RDW: 13 % (ref 11.5–15.5)

## 2013-02-25 MED ORDER — HYDROCODONE-ACETAMINOPHEN 5-325 MG PO TABS
1.0000 | ORAL_TABLET | Freq: Once | ORAL | Status: AC
  Administered 2013-02-25: 1 via ORAL

## 2013-02-25 MED ORDER — CYCLOBENZAPRINE HCL 10 MG PO TABS: 10.0000 mg | ORAL_TABLET | Freq: Two times a day (BID) | ORAL | Status: DC | PRN

## 2013-02-25 MED ORDER — NAPROXEN 500 MG PO TABS: 500.0000 mg | ORAL_TABLET | Freq: Two times a day (BID) | ORAL | Status: DC

## 2013-02-25 MED ORDER — HYDROCODONE-ACETAMINOPHEN 5-325 MG PO TABS: 1.0000 | ORAL_TABLET | Freq: Four times a day (QID) | ORAL | Status: DC | PRN

## 2013-02-25 NOTE — ED Notes (Signed)
Pt reported chest pain when speaking with Eudelia Bunch, RN.

## 2013-02-25 NOTE — ED Notes (Signed)
Pain in neck since Sunday, Increased pain with movement of neck and rt shoulder.  No known injury
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
## 2013-02-25 NOTE — ED Notes (Signed)
C/o pain x 4 days - reports pain begins in right shoulder and radiates to mid chest.  Reports pain worse with movement. States that OTC pain medications have not helped relieve pain.

## 2013-03-15 ENCOUNTER — Ambulatory Visit (HOSPITAL_COMMUNITY): Payer: Medicaid Other

## 2013-04-06 ENCOUNTER — Ambulatory Visit (HOSPITAL_COMMUNITY)
Admission: RE | Admit: 2013-04-06 | Discharge: 2013-04-06 | Disposition: A | Discharge status: Home / Self Care | Payer: Medicaid Other | Source: Ambulatory Visit | Attending: Family Medicine | Admitting: Family Medicine

## 2013-04-06 DIAGNOSIS — Z139 Encounter for screening, unspecified: Secondary | ICD-10-CM

## 2013-04-06 DIAGNOSIS — Z1231 Encounter for screening mammogram for malignant neoplasm of breast: Secondary | ICD-10-CM | POA: Insufficient documentation

## 2013-04-13 ENCOUNTER — Encounter: Payer: Self-pay | Admitting: Orthopedic Surgery

## 2013-04-13 ENCOUNTER — Ambulatory Visit (INDEPENDENT_AMBULATORY_CARE_PROVIDER_SITE_OTHER): Payer: Medicaid Other | Admitting: Orthopedic Surgery

## 2013-04-13 VITALS — BP 128/70 | Ht 64.0 in | Wt 241.0 lb

## 2013-04-13 DIAGNOSIS — M1712 Unilateral primary osteoarthritis, left knee: Secondary | ICD-10-CM | POA: Insufficient documentation

## 2013-04-13 DIAGNOSIS — IMO0002 Reserved for concepts with insufficient information to code with codable children: Secondary | ICD-10-CM

## 2013-04-13 DIAGNOSIS — M1710 Unilateral primary osteoarthritis, unspecified knee: Secondary | ICD-10-CM

## 2013-04-13 NOTE — Progress Notes (Signed)
Patient ID: Rachel [name], female   DOB: [birth year], 57 y.o.   MRN: [id number] Chief Complaint  Patient presents with  . Follow-up    Requestinginjection in her left knee. Last injection 01-06-13.    Chronic left knee pain request left knee injection left knee injected return as needed  Knee  Injection Procedure Note  Pre-operative Diagnosis: left knee oa  Post-operative Diagnosis: same  Indications: pain  Anesthesia: ethyl chloride   Procedure Details   Verbal consent was obtained for the procedure. Time out was completed.The joint was prepped with alcohol, followed by  Ethyl chloride spray and A 20 gauge needle was inserted into the knee via lateral approach; 4ml 1% lidocaine and 1 ml of depomedrol  was then injected into the joint . The needle was removed and the area cleansed and dressed.  Complications:  None; patient tolerated the procedure well.

## 2013-05-20 ENCOUNTER — Ambulatory Visit (INDEPENDENT_AMBULATORY_CARE_PROVIDER_SITE_OTHER): Payer: Medicaid Other | Admitting: Adult Health

## 2013-05-20 ENCOUNTER — Encounter: Payer: Self-pay | Admitting: Adult Health

## 2013-05-20 ENCOUNTER — Other Ambulatory Visit (HOSPITAL_COMMUNITY)
Admission: RE | Admit: 2013-05-20 | Discharge: 2013-05-20 | Disposition: A | Discharge status: Home / Self Care | Payer: Medicaid Other | Source: Ambulatory Visit | Attending: Adult Health | Admitting: Adult Health

## 2013-05-20 VITALS — BP 148/66 | HR 76 | Ht 61.0 in | Wt 248.0 lb

## 2013-05-20 DIAGNOSIS — F329 Major depressive disorder, single episode, unspecified: Secondary | ICD-10-CM

## 2013-05-20 DIAGNOSIS — E669 Obesity, unspecified: Secondary | ICD-10-CM

## 2013-05-20 DIAGNOSIS — Z01419 Encounter for gynecological examination (general) (routine) without abnormal findings: Secondary | ICD-10-CM

## 2013-05-20 DIAGNOSIS — Z0000 Encounter for general adult medical examination without abnormal findings: Secondary | ICD-10-CM

## 2013-05-20 DIAGNOSIS — Z1212 Encounter for screening for malignant neoplasm of rectum: Secondary | ICD-10-CM

## 2013-05-20 DIAGNOSIS — Z1151 Encounter for screening for human papillomavirus (HPV): Secondary | ICD-10-CM | POA: Insufficient documentation

## 2013-05-20 DIAGNOSIS — I10 Essential (primary) hypertension: Secondary | ICD-10-CM

## 2013-05-20 LAB — HEMOCCULT GUIAC POC 1CARD (OFFICE)

## 2013-05-20 NOTE — Patient Instructions (Addendum)
Physical in 1 year Mammogram yearly Colonoscopy as per GI Labs at Dr Loleta Chance

## 2013-05-20 NOTE — Progress Notes (Signed)
Patient ID: Rachel [name], female   DOB: 1956-10-10, 57 y.o.   MRN: [id number] History of Present Illness: Rachel [name] is a 57 yea old black female in for a pap and physical.No gyn complaints.  Current Medications, Allergies, Past Medical History, Past Surgical History, Family History and Social History were reviewed in [employer] record.    Review of Systems: Patient denies any headaches, blurred vision, shortness of breath, chest pain, abdominal pain, problems with bowel movements, urination, or intercourse. She is not having sex. She has pain in her knees and back and sees Dr Romeo Apple, she has no mood changes,she takes celexa for depression.  Physical Exam:BP 148/66  Pulse 76  Ht 5\' 1"  (1.549 m)  Wt 248 lb (112.492 kg)  BMI 46.88 kg/m2 General:  Well developed, well nourished, no acute distress Skin:  Warm and dry Neck:  Midline trachea, normal thyroid Lungs; Clear to auscultation bilaterally Breast:  No dominant palpable mass, retraction, or nipple discharge Cardiovascular: Regular rate and rhythm Abdomen:  Soft, non tender, no hepatosplenomegaly,obese Pelvic:  External genitalia is normal in appearance.  The vagina is normal in appearance for age. The cervix is bulbous. Pap performed with HPV. Uterus is felt to be normal size, shape, and contour.  No adnexal masses or tenderness noted. Rectal: Good sphincter tone, no polyps, or hemorrhoids felt.  Hemoccult negative. Extremities:  No swelling or varicosities noted Psych:  Alert and cooperative and seems happy  Impression: Yearly gyn exam Hypertension Obestiy History of depression  Plan: Physical in 1 year Mammogram yearly Colonoscopy per Dr Darrick Penna Labs with Dr Loleta Chance

## 2013-08-05 ENCOUNTER — Encounter: Payer: Self-pay | Admitting: Podiatrist

## 2013-08-05 ENCOUNTER — Ambulatory Visit (INDEPENDENT_AMBULATORY_CARE_PROVIDER_SITE_OTHER): Payer: Medicaid Other | Admitting: Podiatrist

## 2013-08-05 ENCOUNTER — Ambulatory Visit (INDEPENDENT_AMBULATORY_CARE_PROVIDER_SITE_OTHER): Payer: Medicaid Other

## 2013-08-05 VITALS — BP 127/48 | HR 61 | Resp 16 | Ht 64.0 in | Wt 198.0 lb

## 2013-08-05 DIAGNOSIS — M7750 Other enthesopathy of unspecified foot: Secondary | ICD-10-CM

## 2013-08-05 DIAGNOSIS — M659 Synovitis and tenosynovitis, unspecified: Secondary | ICD-10-CM

## 2013-08-05 DIAGNOSIS — Z9889 Other specified postprocedural states: Secondary | ICD-10-CM

## 2013-08-05 MED ORDER — METHYLPREDNISOLONE (PAK) 4 MG PO TABS: ORAL_TABLET | ORAL | Status: DC

## 2013-08-05 MED ORDER — TRIAMCINOLONE ACETONIDE 10 MG/ML IJ SUSP
10.0000 mg | Freq: Once | INTRAMUSCULAR | Status: AC
  Administered 2013-08-05: 10 mg

## 2013-08-05 NOTE — Patient Instructions (Signed)
If there is no improvement after the injection and/or the steroid pack, please call to make an appointment with Dr. Ralene Cork-- I would recommend he see you for a 2nd opinion for your painful foot.

## 2013-08-05 NOTE — Progress Notes (Signed)
Subjective: Patient presents today for followup of right foot pain. Patient states "it still hurts"  she points to the mid diaphyseal region of the fifth metatarsal right foot. Patient relates he orthotics are of some benefit however her foot starts hurting after be on her foot for a couple of hours. Objective: Neurovascular status is intact and unchanged. The patient continues to have a C-shaped foot with metatarsus adductus noted. Pain at the fifth metatarsal just proximal to the neck region of the lateral foot is noted.  No pain at the head of the fifth metatarsal noted no pain at the base of the fifth metatarsal is seen. X-rays taken and reviewed show no fracture or dislocation and screw fixation that was present her previous tailors bunion correction has been completely removed. Assessment: Status post screw removal from tailors bunion correction, tendinitis plan:Discussed etiology, pathology, conservative vs. Surgical therapies and at this time an injection was recommended.  The patient agreed and a sterile skin prep was applied.  An injection consisting of Kenalog 10 and marcaine mixture was carefully infiltrated lateral aspect of the right fifth metatarsal plantarly in the area of maximal tenderness. .  The patient tolerated this well and was given instructions for immobilization and aftercare. Also dispensed prescription for Medrol Dosepak to take if the pain does not subside after the injection. Discussed with patient if she continues to have discomfort I would recommend a second opinion with Dr. Ralene Cork to see if he has any further treatment options. Patient demonstrated understanding of this conversation and will be seen back as needed.  Marlowe Aschoff DPM

## 2013-08-26 ENCOUNTER — Ambulatory Visit (INDEPENDENT_AMBULATORY_CARE_PROVIDER_SITE_OTHER): Payer: Medicaid Other | Admitting: Otolaryngology

## 2013-08-26 DIAGNOSIS — J320 Chronic maxillary sinusitis: Secondary | ICD-10-CM

## 2013-08-26 DIAGNOSIS — R040 Epistaxis: Secondary | ICD-10-CM

## 2013-09-02 ENCOUNTER — Encounter: Payer: Self-pay | Admitting: Orthopedic Surgery

## 2013-09-02 ENCOUNTER — Ambulatory Visit (INDEPENDENT_AMBULATORY_CARE_PROVIDER_SITE_OTHER): Payer: Medicaid Other | Admitting: Orthopedic Surgery

## 2013-09-02 VITALS — BP 132/73 | Ht 64.0 in | Wt 250.0 lb

## 2013-09-02 DIAGNOSIS — M169 Osteoarthritis of hip, unspecified: Secondary | ICD-10-CM

## 2013-09-02 DIAGNOSIS — M1711 Unilateral primary osteoarthritis, right knee: Secondary | ICD-10-CM | POA: Insufficient documentation

## 2013-09-02 NOTE — Progress Notes (Signed)
Patient ID: Rachel [name], female   DOB: Oct 27, 1956, 57 y.o.   MRN: [id number]  Chief Complaint  Patient presents with  . Follow-up     Bilateral knees pain. Request bIlateral knee injections. Consult from Dr. Loleta Chance    BP 132/73  Ht 5\' 4"  (1.626 m)  Wt 250 lb (113.399 kg)  BMI 42.89 kg/m2  The patient came in for bilateral knee injections  She also has left hip pain and groin pain a CT scan shows she is stratified as a left hip. I placed a right total hip in her several years ago she is doing well with that. However, the left femur is a posttraumatic IM nail which will require removal. Unfortunately, I cannot do that procedure and would not feel comfortable placing a hip replacement after that is on sending her to [facility] where it was done for definitive treatment  Knee  Injection Procedure Note  Pre-operative Diagnosis: left knee oa  Post-operative Diagnosis: same  Indications: pain  Anesthesia: ethyl chloride   Procedure Details   Verbal consent was obtained for the procedure. Time out was completed.The joint was prepped with alcohol, followed by  Ethyl chloride spray and A 20 gauge needle was inserted into the knee via lateral approach; 4ml 1% lidocaine and 1 ml of depomedrol  was then injected into the joint . The needle was removed and the area cleansed and dressed.  Complications:  None; patient tolerated the procedure well. Knee  Injection Procedure Note  Pre-operative Diagnosis: right knee oa  Post-operative Diagnosis: same  Indications: pain  Anesthesia: ethyl chloride   Procedure Details   Verbal consent was obtained for the procedure. Time out was completed.The joint was prepped with alcohol, followed by  Ethyl chloride spray and A 20 gauge needle was inserted into the knee via lateral approach; 4ml 1% lidocaine and 1 ml of depomedrol  was then injected into the joint . The needle was removed and the area cleansed and dressed.  Complications:  None; patient  tolerated the procedure well.

## 2013-09-02 NOTE — Patient Instructions (Signed)
Referral to [facility]  IM nail placed for trauma now with hip OA for THA

## 2013-09-03 ENCOUNTER — Telehealth: Payer: Self-pay | Admitting: *Deleted

## 2013-09-03 NOTE — Telephone Encounter (Signed)
Dr. Romeo Apple recommends that the patient be referred to [facility] for Left IM Nail placement for trauma removed and Total Hip Arthoplasty for osteoarthritis. We could not make the referral due to patient having [employer]. I did call and speak with Dr. Adaline Sill office to make them aware of what needed to be done, and I faxed them our office notes, so they will make the referral and notify the patient, after discussing it with Dr. Loleta Chance.

## 2013-09-16 ENCOUNTER — Emergency Department (HOSPITAL_COMMUNITY): Payer: Medicaid Other

## 2013-09-16 ENCOUNTER — Emergency Department (HOSPITAL_COMMUNITY)
Admission: EM | Admit: 2013-09-16 | Discharge: 2013-09-16 | Disposition: A | Discharge status: Home / Self Care | Payer: Medicaid Other | Attending: Emergency Medicine | Admitting: Emergency Medicine

## 2013-09-16 ENCOUNTER — Encounter (HOSPITAL_COMMUNITY): Payer: Self-pay | Admitting: Emergency Medicine

## 2013-09-16 DIAGNOSIS — F329 Major depressive disorder, single episode, unspecified: Secondary | ICD-10-CM | POA: Insufficient documentation

## 2013-09-16 DIAGNOSIS — E785 Hyperlipidemia, unspecified: Secondary | ICD-10-CM | POA: Insufficient documentation

## 2013-09-16 DIAGNOSIS — R079 Chest pain, unspecified: Secondary | ICD-10-CM | POA: Insufficient documentation

## 2013-09-16 DIAGNOSIS — I10 Essential (primary) hypertension: Secondary | ICD-10-CM | POA: Insufficient documentation

## 2013-09-16 DIAGNOSIS — G8929 Other chronic pain: Secondary | ICD-10-CM | POA: Insufficient documentation

## 2013-09-16 DIAGNOSIS — F411 Generalized anxiety disorder: Secondary | ICD-10-CM | POA: Insufficient documentation

## 2013-09-16 DIAGNOSIS — Z79899 Other long term (current) drug therapy: Secondary | ICD-10-CM | POA: Insufficient documentation

## 2013-09-16 DIAGNOSIS — F3289 Other specified depressive episodes: Secondary | ICD-10-CM | POA: Insufficient documentation

## 2013-09-16 DIAGNOSIS — M1990 Unspecified osteoarthritis, unspecified site: Secondary | ICD-10-CM | POA: Insufficient documentation

## 2013-09-16 DIAGNOSIS — E669 Obesity, unspecified: Secondary | ICD-10-CM | POA: Insufficient documentation

## 2013-09-16 LAB — CBC
MCV: 89.4 fL (ref 78.0–100.0)
Platelets: 258 10*3/uL (ref 150–400)
RBC: 4.05 MIL/uL (ref 3.87–5.11)
WBC: 6.5 10*3/uL (ref 4.0–10.5)

## 2013-09-16 LAB — COMPREHENSIVE METABOLIC PANEL
ALT: 26 U/L (ref 0–35)
AST: 25 U/L (ref 0–37)
CO2: 26 mEq/L (ref 19–32)
Chloride: 101 mEq/L (ref 96–112)
GFR calc non Af Amer: >90 mL/min (ref >90)
Sodium: 135 mEq/L (ref 135–145)
Total Bilirubin: 0.2 mg/dL — ABNORMAL LOW (ref 0.3–1.2)

## 2013-09-16 MED ORDER — ASPIRIN 81 MG PO CHEW
324.0000 mg | CHEWABLE_TABLET | Freq: Once | ORAL | Status: AC
  Administered 2013-09-16: 324 mg via ORAL
  Filled 2013-09-16: qty 4

## 2013-09-16 MED ORDER — HYDROCODONE-ACETAMINOPHEN 10-325 MG PO TABS: 1.0000 | ORAL_TABLET | ORAL | Status: DC | PRN

## 2013-09-16 MED ORDER — KETOROLAC TROMETHAMINE 30 MG/ML IJ SOLN
30.0000 mg | Freq: Once | INTRAMUSCULAR | Status: AC
  Administered 2013-09-16: 30 mg via INTRAVENOUS
  Filled 2013-09-16: qty 1

## 2013-09-16 MED ORDER — HYDROMORPHONE HCL PF 1 MG/ML IJ SOLN
1.0000 mg | Freq: Once | INTRAMUSCULAR | Status: AC
  Administered 2013-09-16: 1 mg via INTRAVENOUS
  Filled 2013-09-16: qty 1

## 2013-09-16 NOTE — ED Notes (Signed)
Pt requested additional meds for pain, cont. To complain of right shoulder pain, advised that unable to give additional meds at this time.  Pt nodded in understanding.
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
## 2013-09-16 NOTE — ED Notes (Signed)
Central cp radiating into right neck and right jaw x 3 days.  Worse with movement.  Denies n/v/sob/dizziness.

## 2013-09-16 NOTE — ED Notes (Signed)
Pt stated she if feeling much better. Awaiting results. Drinking water at present.

## 2013-09-29 ENCOUNTER — Other Ambulatory Visit: Payer: Self-pay | Admitting: *Deleted

## 2013-09-29 DIAGNOSIS — R112 Nausea with vomiting, unspecified: Secondary | ICD-10-CM

## 2013-09-29 MED ORDER — PROMETHAZINE HCL 25 MG PO TABS: 25.0000 mg | ORAL_TABLET | Freq: Four times a day (QID) | ORAL | Status: DC

## 2013-09-30 DIAGNOSIS — M25559 Pain in unspecified hip: Secondary | ICD-10-CM | POA: Insufficient documentation

## 2013-09-30 DIAGNOSIS — M169 Osteoarthritis of hip, unspecified: Secondary | ICD-10-CM | POA: Insufficient documentation

## 2013-11-04 ENCOUNTER — Encounter (HOSPITAL_COMMUNITY): Payer: Self-pay | Admitting: Emergency Medicine

## 2013-11-04 ENCOUNTER — Emergency Department (HOSPITAL_COMMUNITY)
Admission: EM | Admit: 2013-11-04 | Discharge: 2013-11-04 | Disposition: A | Discharge status: Home / Self Care | Payer: Medicaid Other | Attending: Emergency Medicine | Admitting: Emergency Medicine

## 2013-11-04 ENCOUNTER — Emergency Department (HOSPITAL_COMMUNITY): Payer: Medicaid Other

## 2013-11-04 DIAGNOSIS — K529 Noninfective gastroenteritis and colitis, unspecified: Secondary | ICD-10-CM

## 2013-11-04 DIAGNOSIS — Z79899 Other long term (current) drug therapy: Secondary | ICD-10-CM | POA: Insufficient documentation

## 2013-11-04 DIAGNOSIS — K5289 Other specified noninfective gastroenteritis and colitis: Secondary | ICD-10-CM | POA: Insufficient documentation

## 2013-11-04 DIAGNOSIS — E669 Obesity, unspecified: Secondary | ICD-10-CM | POA: Insufficient documentation

## 2013-11-04 DIAGNOSIS — F329 Major depressive disorder, single episode, unspecified: Secondary | ICD-10-CM | POA: Insufficient documentation

## 2013-11-04 DIAGNOSIS — R112 Nausea with vomiting, unspecified: Secondary | ICD-10-CM

## 2013-11-04 DIAGNOSIS — M1990 Unspecified osteoarthritis, unspecified site: Secondary | ICD-10-CM | POA: Insufficient documentation

## 2013-11-04 DIAGNOSIS — I10 Essential (primary) hypertension: Secondary | ICD-10-CM | POA: Insufficient documentation

## 2013-11-04 DIAGNOSIS — Z8781 Personal history of (healed) traumatic fracture: Secondary | ICD-10-CM | POA: Insufficient documentation

## 2013-11-04 DIAGNOSIS — F411 Generalized anxiety disorder: Secondary | ICD-10-CM | POA: Insufficient documentation

## 2013-11-04 DIAGNOSIS — E785 Hyperlipidemia, unspecified: Secondary | ICD-10-CM | POA: Insufficient documentation

## 2013-11-04 DIAGNOSIS — F3289 Other specified depressive episodes: Secondary | ICD-10-CM | POA: Insufficient documentation

## 2013-11-04 LAB — URINALYSIS, ROUTINE W REFLEX MICROSCOPIC
BILIRUBIN URINE: NEGATIVE
Glucose, UA: NEGATIVE mg/dL
Nitrite: NEGATIVE
UROBILINOGEN UA: 0.2 mg/dL (ref 0.0–1.0)
pH: 6 (ref 5.0–8.0)

## 2013-11-04 LAB — LIPASE, BLOOD: LIPASE: 53 U/L (ref 11–59)

## 2013-11-04 LAB — CBC WITH DIFFERENTIAL/PLATELET
BASOS PCT: 0 % (ref 0–1)
Basophils Absolute: 0 10*3/uL (ref 0.0–0.1)
EOS ABS: 0 10*3/uL (ref 0.0–0.7)
Eosinophils Relative: 0 % (ref 0–5)
HCT: 37.2 % (ref 36.0–46.0)
HEMOGLOBIN: 12.4 g/dL (ref 12.0–15.0)
Lymphocytes Relative: 7 % — ABNORMAL LOW (ref 12–46)
Lymphs Abs: 1 10*3/uL (ref 0.7–4.0)
MCH: 30.5 pg (ref 26.0–34.0)
MCHC: 33.3 g/dL (ref 30.0–36.0)
MCV: 91.6 fL (ref 78.0–100.0)
MONOS PCT: 10 % (ref 3–12)
Monocytes Absolute: 1.4 10*3/uL — ABNORMAL HIGH (ref 0.1–1.0)
NEUTROS PCT: 82 % — AB (ref 43–77)
Neutro Abs: 11 10*3/uL — ABNORMAL HIGH (ref 1.7–7.7)
PLATELETS: 189 10*3/uL (ref 150–400)
RBC: 4.06 MIL/uL (ref 3.87–5.11)
RDW: 13.7 % (ref 11.5–15.5)
WBC: 13.3 10*3/uL — ABNORMAL HIGH (ref 4.0–10.5)

## 2013-11-04 LAB — LACTIC ACID, PLASMA: LACTIC ACID, VENOUS: 1.2 mmol/L (ref 0.5–2.2)

## 2013-11-04 LAB — COMPREHENSIVE METABOLIC PANEL
ALBUMIN: 2.8 g/dL — AB (ref 3.5–5.2)
ALT: 18 U/L (ref 0–35)
AST: 21 U/L (ref 0–37)
Alkaline Phosphatase: 52 U/L (ref 39–117)
BUN: 7 mg/dL (ref 6–23)
CO2: 19 mEq/L (ref 19–32)
CREATININE: 0.76 mg/dL (ref 0.50–1.10)
Calcium: 8.4 mg/dL (ref 8.4–10.5)
Chloride: 103 mEq/L (ref 96–112)
GFR calc non Af Amer: >90 mL/min (ref >90)
Glucose, Bld: 131 mg/dL — ABNORMAL HIGH (ref 70–99)
Potassium: 3.5 mEq/L — ABNORMAL LOW (ref 3.7–5.3)
Sodium: 135 mEq/L — ABNORMAL LOW (ref 137–147)
TOTAL PROTEIN: 6.4 g/dL (ref 6.0–8.3)
Total Bilirubin: 0.5 mg/dL (ref 0.3–1.2)

## 2013-11-04 LAB — URINE MICROSCOPIC-ADD ON

## 2013-11-04 MED ORDER — METRONIDAZOLE 500 MG PO TABS: 500.0000 mg | ORAL_TABLET | Freq: Three times a day (TID) | ORAL | Status: DC

## 2013-11-04 MED ORDER — CIPROFLOXACIN HCL 500 MG PO TABS: 500.0000 mg | ORAL_TABLET | Freq: Two times a day (BID) | ORAL | Status: DC

## 2013-11-04 MED ORDER — OXYCODONE-ACETAMINOPHEN 5-325 MG PO TABS: 1.0000 | ORAL_TABLET | ORAL | Status: DC | PRN

## 2013-11-04 MED ORDER — METRONIDAZOLE 500 MG PO TABS
500.0000 mg | ORAL_TABLET | Freq: Once | ORAL | Status: AC
  Administered 2013-11-04: 500 mg via ORAL
  Filled 2013-11-04: qty 1

## 2013-11-04 MED ORDER — ONDANSETRON 4 MG PO TBDP
4.0000 mg | ORAL_TABLET | Freq: Once | ORAL | Status: AC
  Administered 2013-11-04: 4 mg via ORAL
  Filled 2013-11-04: qty 1

## 2013-11-04 MED ORDER — MORPHINE SULFATE 4 MG/ML IJ SOLN
4.0000 mg | Freq: Once | INTRAMUSCULAR | Status: AC
  Administered 2013-11-04: 4 mg via INTRAVENOUS
  Filled 2013-11-04: qty 1

## 2013-11-04 MED ORDER — ONDANSETRON 8 MG PO TBDP
ORAL_TABLET | ORAL | Status: AC
  Filled 2013-11-04: qty 1

## 2013-11-04 MED ORDER — ONDANSETRON HCL 4 MG PO TABS
4.0000 mg | ORAL_TABLET | Freq: Four times a day (QID) | ORAL | Status: DC | PRN
Start: 2013-11-04 — End: 2013-12-11

## 2013-11-04 MED ORDER — SODIUM CHLORIDE 0.9 % IV SOLN
1000.0000 mL | INTRAVENOUS | Status: DC
  Administered 2013-11-04: 1000 mL via INTRAVENOUS

## 2013-11-04 MED ORDER — CIPROFLOXACIN HCL 250 MG PO TABS
500.0000 mg | ORAL_TABLET | Freq: Once | ORAL | Status: AC
Start: 2013-11-04 — End: 2013-11-04
  Administered 2013-11-04: 500 mg via ORAL
  Filled 2013-11-04: qty 2

## 2013-11-04 MED ORDER — IOHEXOL 300 MG/ML  SOLN
50.0000 mL | Freq: Once | INTRAMUSCULAR | Status: AC | PRN
  Administered 2013-11-04: 50 mL via INTRAVENOUS

## 2013-11-04 MED ORDER — IOHEXOL 300 MG/ML  SOLN
100.0000 mL | Freq: Once | INTRAMUSCULAR | Status: AC | PRN
  Administered 2013-11-04: 100 mL via INTRAVENOUS

## 2013-11-04 MED ORDER — METRONIDAZOLE 500 MG PO TABS: 500.0000 mg | ORAL_TABLET | Freq: Two times a day (BID) | ORAL | Status: DC

## 2013-11-04 MED ORDER — ONDANSETRON HCL 4 MG/2ML IJ SOLN
4.0000 mg | Freq: Once | INTRAMUSCULAR | Status: AC
  Administered 2013-11-04: 4 mg via INTRAVENOUS
  Filled 2013-11-04: qty 2

## 2013-11-04 MED ORDER — SODIUM CHLORIDE 0.9 % IV SOLN
1000.0000 mL | Freq: Once | INTRAVENOUS | Status: AC
  Administered 2013-11-04: 1000 mL via INTRAVENOUS
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
## 2013-11-04 NOTE — ED Notes (Signed)
N/V/D for several days, also states blood pressure has been running high, but has not taken bp med today.

## 2013-11-04 NOTE — Discharge Instructions (Signed)
Return to the ED if pain is not being adequately controlled, nausea is not being adequately controlled, or if your fever goes higher than 102.  Colitis Colitis is inflammation of the colon. Colitis can be a short-term or long-standing (chronic) illness. Crohn's disease and ulcerative colitis are 2 types of colitis which are chronic. They usually require lifelong treatment. CAUSES  There are many different causes of colitis, including:  Viruses.  Germs (bacteria).  Medicine reactions. SYMPTOMS   Diarrhea.  Intestinal bleeding.  Pain.  Fever.  Throwing up (vomiting).  Tiredness (fatigue).  Weight loss.  Bowel blockage. DIAGNOSIS  The diagnosis of colitis is based on examination and stool or blood tests. X-rays, CT scan, and colonoscopy may also be needed. TREATMENT  Treatment may include:  Fluids given through the vein (intravenously).  Bowel rest (nothing to eat or drink for a period of time).  Medicine for pain and diarrhea.  Medicines (antibiotics) that kill germs.  Cortisone medicines.  Surgery. HOME CARE INSTRUCTIONS   Get plenty of rest.  Drink enough water and fluids to keep your urine clear or pale yellow.  Eat a well-balanced diet.  Call your caregiver for follow-up as recommended. SEEK IMMEDIATE MEDICAL CARE IF:   You develop chills.  You have an oral temperature above 102 F (38.9 C), not controlled by medicine.  You have extreme weakness, fainting, or dehydration.  You have repeated vomiting.  You develop severe belly (abdominal) pain or are passing bloody or tarry stools. MAKE SURE YOU:   Understand these instructions.  Will watch your condition.  Will get help right away if you are not doing well or get worse. Document Released: 11/21/2004 Document Revised: 01/06/2012 Document Reviewed: 02/16/2010 [facility] Patient Information 2014 [place], [place].  Nausea and Vomiting Nausea is a sick feeling that often comes before throwing up  (vomiting). Vomiting is a reflex where stomach contents come out of your mouth. Vomiting can cause severe loss of body fluids (dehydration). Children and elderly adults can become dehydrated quickly, especially if they also have diarrhea. Nausea and vomiting are symptoms of a condition or disease. It is important to find the cause of your symptoms. CAUSES   Direct irritation of the stomach lining. This irritation can result from increased acid production (gastroesophageal reflux disease), infection, food poisoning, taking certain medicines (such as nonsteroidal anti-inflammatory drugs), alcohol use, or tobacco use.  Signals from the brain.These signals could be caused by a headache, heat exposure, an inner ear disturbance, increased pressure in the brain from injury, infection, a tumor, or a concussion, pain, emotional stimulus, or metabolic problems.  An obstruction in the gastrointestinal tract (bowel obstruction).  Illnesses such as diabetes, hepatitis, gallbladder problems, appendicitis, kidney problems, cancer, sepsis, atypical symptoms of a heart attack, or eating disorders.  Medical treatments such as chemotherapy and radiation.  Receiving medicine that makes you sleep (general anesthetic) during surgery. DIAGNOSIS Your caregiver may ask for tests to be done if the problems do not improve after a few days. Tests may also be done if symptoms are severe or if the reason for the nausea and vomiting is not clear. Tests may include:  Urine tests.  Blood tests.  Stool tests.  Cultures (to look for evidence of infection).  X-rays or other imaging studies. Test results can help your caregiver make decisions about treatment or the need for additional tests. TREATMENT You need to stay well hydrated. Drink frequently but in small amounts.You may wish to drink water, sports drinks, clear broth, or  eat frozen ice pops or gelatin dessert to help stay hydrated.When you eat, eating slowly may  help prevent nausea.There are also some antinausea medicines that may help prevent nausea. HOME CARE INSTRUCTIONS   Take all medicine as directed by your caregiver.  If you do not have an appetite, do not force yourself to eat. However, you must continue to drink fluids.  If you have an appetite, eat a normal diet unless your caregiver tells you differently.  Eat a variety of complex carbohydrates (rice, wheat, potatoes, bread), lean meats, yogurt, fruits, and vegetables.  Avoid high-fat foods because they are more difficult to digest.  Drink enough water and fluids to keep your urine clear or pale yellow.  If you are dehydrated, ask your caregiver for specific rehydration instructions. Signs of dehydration may include:  Severe thirst.  Dry lips and mouth.  Dizziness.  Dark urine.  Decreasing urine frequency and amount.  Confusion.  Rapid breathing or pulse. SEEK IMMEDIATE MEDICAL CARE IF:   You have blood or brown flecks (like coffee grounds) in your vomit.  You have black or bloody stools.  You have a severe headache or stiff neck.  You are confused.  You have severe abdominal pain.  You have chest pain or trouble breathing.  You do not urinate at least once every 8 hours.  You develop cold or clammy skin.  You continue to vomit for longer than 24 to 48 hours.  You have a fever. MAKE SURE YOU:   Understand these instructions.  Will watch your condition.  Will get help right away if you are not doing well or get worse. Document Released: 10/14/2005 Document Revised: 01/06/2012 Document Reviewed: 03/13/2011 [facility] Patient Information 2014 [place], [place].  Metronidazole tablets or capsules What is this medicine? METRONIDAZOLE (me troe NI da zole) is an antiinfective. It is used to treat certain kinds of bacterial and protozoal infections. It will not work for colds, flu, or other viral infections. This medicine may be used for other purposes; ask  your health care provider or pharmacist if you have questions. COMMON BRAND NAME(S): Flagyl What should I tell my health care provider before I take this medicine? They need to know if you have any of these conditions: -anemia or other blood disorders -disease of the nervous system -fungal or yeast infection -if you drink alcohol containing drinks -liver disease -seizures -an unusual or allergic reaction to metronidazole, or other medicines, foods, dyes, or preservatives -pregnant or trying to get pregnant -breast-feeding How should I use this medicine? Take this medicine by mouth with a full glass of water. Follow the directions on the prescription label. Take your medicine at regular intervals. Do not take your medicine more often than directed. Take all of your medicine as directed even if you think you are better. Do not skip doses or stop your medicine early. Talk to your pediatrician regarding the use of this medicine in children. Special care may be needed. Overdosage: If you think you have taken too much of this medicine contact a poison control center or emergency room at once. NOTE: This medicine is only for you. Do not share this medicine with others. What if I miss a dose? If you miss a dose, take it as soon as you can. If it is almost time for your next dose, take only that dose. Do not take double or extra doses. What may interact with this medicine? Do not take this medicine with any of the following medications: -alcohol  or any product that contains alcohol -amprenavir oral solution -cisapride -disulfiram -dofetilide -dronedarone -paclitaxel injection -pimozide -ritonavir oral solution -sertraline oral solution -sulfamethoxazole-trimethoprim injection -thioridazine -ziprasidone This medicine may also interact with the following medications: -cimetidine -lithium -other medicines that prolong the QT interval (cause an abnormal heart  rhythm) -phenobarbital -phenytoin -warfarin This list may not describe all possible interactions. Give your health care provider a list of all the medicines, herbs, non-prescription drugs, or dietary supplements you use. Also tell them if you smoke, drink alcohol, or use illegal drugs. Some items may interact with your medicine. What should I watch for while using this medicine? Tell your doctor or health care professional if your symptoms do not improve or if they get worse. You may get drowsy or dizzy. Do not drive, use machinery, or do anything that needs mental alertness until you know how this medicine affects you. Do not stand or sit up quickly, especially if you are an older patient. This reduces the risk of dizzy or fainting spells. Avoid alcoholic drinks while you are taking this medicine and for three days afterward. Alcohol may make you feel dizzy, sick, or flushed. If you are being treated for a sexually transmitted disease, avoid sexual contact until you have finished your treatment. Your sexual partner may also need treatment. What side effects may I notice from receiving this medicine? Side effects that you should report to your doctor or health care professional as soon as possible: -allergic reactions like skin rash or hives, swelling of the face, lips, or tongue -confusion, clumsiness -difficulty speaking -discolored or sore mouth -dizziness -fever, infection -numbness, tingling, pain or weakness in the hands or feet -trouble passing urine or change in the amount of urine -redness, blistering, peeling or loosening of the skin, including inside the mouth -seizures -unusually weak or tired -vaginal irritation, dryness, or discharge Side effects that usually do not require medical attention (report to your doctor or health care professional if they continue or are bothersome): -diarrhea -headache -irritability -metallic taste -nausea -stomach pain or cramps -trouble  sleeping This list may not describe all possible side effects. Call your doctor for medical advice about side effects. You may report side effects to FDA at 1-800-FDA-1088. Where should I keep my medicine? Keep out of the reach of children. Store at room temperature below 25 degrees C (77 degrees F). Protect from light. Keep container tightly closed. Throw away any unused medicine after the expiration date. NOTE: This sheet is a summary. It may not cover all possible information. If you have questions about this medicine, talk to your doctor, pharmacist, or health care provider.  2014, [employer]. (2013-04-13 14:08:26)  Ondansetron tablets What is this medicine? ONDANSETRON (on DAN se tron) is used to treat nausea and vomiting caused by chemotherapy. It is also used to prevent or treat nausea and vomiting after surgery. This medicine may be used for other purposes; ask your health care provider or pharmacist if you have questions. COMMON BRAND NAME(S): Zofran What should I tell my health care provider before I take this medicine? They need to know if you have any of these conditions: -heart disease -history of irregular heartbeat -liver disease -low levels of magnesium or potassium in the blood -an unusual or allergic reaction to ondansetron, granisetron, other medicines, foods, dyes, or preservatives -pregnant or trying to get pregnant -breast-feeding How should I use this medicine? Take this medicine by mouth with a glass of water. Follow the directions on your prescription label. Take  your doses at regular intervals. Do not take your medicine more often than directed. Talk to your pediatrician regarding the use of this medicine in children. Special care may be needed. Overdosage: If you think you have taken too much of this medicine contact a poison control center or emergency room at once. NOTE: This medicine is only for you. Do not share this medicine with others. What if  I miss a dose? If you miss a dose, take it as soon as you can. If it is almost time for your next dose, take only that dose. Do not take double or extra doses. What may interact with this medicine? Do not take this medicine with any of the following medications: -apomorphine -cisapride -dofetilide -dronedarone -pimozide -thioridazine -ziprasidone  This medicine may also interact with the following medications: -carbamazepine -phenytoin -rifampicin -tramadol -other medicines that prolong the QT interval (cause an abnormal heart rhythm) This list may not describe all possible interactions. Give your health care provider a list of all the medicines, herbs, non-prescription drugs, or dietary supplements you use. Also tell them if you smoke, drink alcohol, or use illegal drugs. Some items may interact with your medicine. What should I watch for while using this medicine? Check with your doctor or health care professional right away if you have any sign of an allergic reaction. What side effects may I notice from receiving this medicine? Side effects that you should report to your doctor or health care professional as soon as possible: -allergic reactions like skin rash, itching or hives, swelling of the face, lips or tongue -breathing problems -dizziness -fast or irregular heartbeat -feeling faint or lightheaded, falls -fever and chills -swelling of the hands or feet -tightness in the chest Side effects that usually do not require medical attention (report to your doctor or health care professional if they continue or are bothersome): -constipation or diarrhea -headache This list may not describe all possible side effects. Call your doctor for medical advice about side effects. You may report side effects to FDA at 1-800-FDA-1088. Where should I keep my medicine? Keep out of the reach of children. Store between 2 and 30 degrees C (36 and 86 degrees F). Throw away any unused medicine  after the expiration date. NOTE: This sheet is a summary. It may not cover all possible information. If you have questions about this medicine, talk to your doctor, pharmacist, or health care provider.  2014, [employer] Standard. (2012-10-15 16:05:54)  Acetaminophen; Oxycodone tablets What is this medicine? ACETAMINOPHEN; OXYCODONE (a set a MEE noe fen; ox i KOE done) is a pain reliever. It is used to treat mild to moderate pain. This medicine may be used for other purposes; ask your health care provider or pharmacist if you have questions. COMMON BRAND NAME(S): Endocet, Magnacet, Narvox, Percocet, Perloxx, Primalev, Primlev, Roxicet, Xolox What should I tell my health care provider before I take this medicine? They need to know if you have any of these conditions: -brain tumor -Crohn's disease, inflammatory bowel disease, or ulcerative colitis -drink more than 3 alcohol containing drinks per day -drug abuse or addiction -head injury -heart or circulation problems -kidney disease or problems going to the bathroom -liver disease -lung disease, asthma, or breathing problems -an unusual or allergic reaction to acetaminophen, oxycodone, other opioid analgesics, other medicines, foods, dyes, or preservatives -pregnant or trying to get pregnant -breast-feeding How should I use this medicine? Take this medicine by mouth with a full glass of water. Follow the directions on the  prescription label. Take your medicine at regular intervals. Do not take your medicine more often than directed. Talk to your pediatrician regarding the use of this medicine in children. Special care may be needed. Patients over 22 years old may have a stronger reaction and need a smaller dose. Overdosage: If you think you have taken too much of this medicine contact a poison control center or emergency room at once. NOTE: This medicine is only for you. Do not share this medicine with others. What if I miss a dose? If  you miss a dose, take it as soon as you can. If it is almost time for your next dose, take only that dose. Do not take double or extra doses. What may interact with this medicine? -alcohol -antihistamines -barbiturates like amobarbital, butalbital, butabarbital, methohexital, pentobarbital, phenobarbital, thiopental, and secobarbital -benztropine -drugs for bladder problems like solifenacin, trospium, oxybutynin, tolterodine, hyoscyamine, and methscopolamine -drugs for breathing problems like ipratropium and tiotropium -drugs for certain stomach or intestine problems like propantheline, homatropine methylbromide, glycopyrrolate, atropine, belladonna, and dicyclomine -general anesthetics like etomidate, ketamine, nitrous oxide, propofol, desflurane, enflurane, halothane, isoflurane, and sevoflurane -medicines for depression, anxiety, or psychotic disturbances -medicines for sleep -muscle relaxants -naltrexone -narcotic medicines (opiates) for pain -phenothiazines like perphenazine, thioridazine, chlorpromazine, mesoridazine, fluphenazine, prochlorperazine, promazine, and trifluoperazine -scopolamine -tramadol -trihexyphenidyl This list may not describe all possible interactions. Give your health care provider a list of all the medicines, herbs, non-prescription drugs, or dietary supplements you use. Also tell them if you smoke, drink alcohol, or use illegal drugs. Some items may interact with your medicine. What should I watch for while using this medicine? Tell your doctor or health care professional if your pain does not go away, if it gets worse, or if you have new or a different type of pain. You may develop tolerance to the medicine. Tolerance means that you will need a higher dose of the medication for pain relief. Tolerance is normal and is expected if you take this medicine for a long time. Do not suddenly stop taking your medicine because you may develop a severe reaction. Your body  becomes used to the medicine. This does NOT mean you are addicted. Addiction is a behavior related to getting and using a drug for a non-medical reason. If you have pain, you have a medical reason to take pain medicine. Your doctor will tell you how much medicine to take. If your doctor wants you to stop the medicine, the dose will be slowly lowered over time to avoid any side effects. You may get drowsy or dizzy. Do not drive, use machinery, or do anything that needs mental alertness until you know how this medicine affects you. Do not stand or sit up quickly, especially if you are an older patient. This reduces the risk of dizzy or fainting spells. Alcohol may interfere with the effect of this medicine. Avoid alcoholic drinks. There are different types of narcotic medicines (opiates) for pain. If you take more than one type at the same time, you may have more side effects. Give your health care provider a list of all medicines you use. Your doctor will tell you how much medicine to take. Do not take more medicine than directed. Call emergency for help if you have problems breathing. The medicine will cause constipation. Try to have a bowel movement at least every 2 to 3 days. If you do not have a bowel movement for 3 days, call your doctor or health care professional. Do not  take Tylenol (acetaminophen) or medicines that have acetaminophen with this medicine. Too much acetaminophen can be very dangerous. Many nonprescription medicines contain acetaminophen. Always read the labels carefully to avoid taking more acetaminophen. What side effects may I notice from receiving this medicine? Side effects that you should report to your doctor or health care professional as soon as possible: -allergic reactions like skin rash, itching or hives, swelling of the face, lips, or tongue -breathing difficulties, wheezing -confusion -light headedness or fainting spells -severe stomach pain -yellowing of the skin or  the whites of the eyes Side effects that usually do not require medical attention (report to your doctor or health care professional if they continue or are bothersome): -dizziness -drowsiness -nausea -vomiting This list may not describe all possible side effects. Call your doctor for medical advice about side effects. You may report side effects to FDA at 1-800-FDA-1088. Where should I keep my medicine? Keep out of the reach of children. This medicine can be abused. Keep your medicine in a safe place to protect it from theft. Do not share this medicine with anyone. Selling or giving away this medicine is dangerous and against the law. Store at room temperature between 20 and 25 degrees C (68 and 77 degrees F). Keep container tightly closed. Protect from light. This medicine may cause accidental overdose and death if it is taken by other adults, children, or pets. Flush any unused medicine down the toilet to reduce the chance of harm. Do not use the medicine after the expiration date. NOTE: This sheet is a summary. It may not cover all possible information. If you have questions about this medicine, talk to your doctor, pharmacist, or health care provider.  2014, [employer] Standard. (2013-03-30 11:35:32)  Ciprofloxacin tablets What is this medicine? CIPROFLOXACIN (sip roe FLOX a sin) is a quinolone antibiotic. It is used to treat certain kinds of bacterial infections. It will not work for colds, flu, or other viral infections. This medicine may be used for other purposes; ask your health care provider or pharmacist if you have questions. COMMON BRAND NAME(S): Cipro What should I tell my health care provider before I take this medicine? They need to know if you have any of these conditions: -bone problems -cerebral disease -joint problems -irregular heartbeat -kidney disease -liver disease -myasthenia gravis -seizure disorder -tendon problems -an unusual or allergic reaction to  ciprofloxacin, other antibiotics or medicines, foods, dyes, or preservatives -pregnant or trying to get pregnant -breast-feeding How should I use this medicine? Take this medicine by mouth with a glass of water. Follow the directions on the prescription label. Take your medicine at regular intervals. Do not take your medicine more often than directed. Take all of your medicine as directed even if you think your are better. Do not skip doses or stop your medicine early. You can take this medicine with food or on an empty stomach. It can be taken with a meal that contains dairy or calcium, but do not take it alone with a dairy product, like milk or yogurt or calcium-fortified juice. A special MedGuide will be given to you by the pharmacist with each prescription and refill. Be sure to read this information carefully each time. Talk to your pediatrician regarding the use of this medicine in children. Special care may be needed. Overdosage: If you think you have taken too much of this medicine contact a poison control center or emergency room at once. NOTE: This medicine is only for you. Do not share  this medicine with others. What if I miss a dose? If you miss a dose, take it as soon as you can. If it is almost time for your next dose, take only that dose. Do not take double or extra doses. What may interact with this medicine? Do not take this medicine with any of the following medications: -cisapride -droperidol -terfenadine -tizanidine This medicine may also interact with the following medications: -antacids -caffeine -cyclosporin -didanosine (ddI) buffered tablets or powder -medicines for diabetes -medicines for inflammation like ibuprofen, naproxen -methotrexate -multivitamins -omeprazole -phenytoin -probenecid -sucralfate -theophylline -warfarin This list may not describe all possible interactions. Give your health care provider a list of all the medicines, herbs, non-prescription  drugs, or dietary supplements you use. Also tell them if you smoke, drink alcohol, or use illegal drugs. Some items may interact with your medicine. What should I watch for while using this medicine? Tell your doctor or health care professional if your symptoms do not improve. Do not treat diarrhea with over the counter products. Contact your doctor if you have diarrhea that lasts more than 2 days or if it is severe and watery. You may get drowsy or dizzy. Do not drive, use machinery, or do anything that needs mental alertness until you know how this medicine affects you. Do not stand or sit up quickly, especially if you are an older patient. This reduces the risk of dizzy or fainting spells. This medicine can make you more sensitive to the sun. Keep out of the sun. If you cannot avoid being in the sun, wear protective clothing and use sunscreen. Do not use sun lamps or tanning beds/booths. Avoid antacids, aluminum, calcium, iron, magnesium, and zinc products for 6 hours before and 2 hours after taking a dose of this medicine. What side effects may I notice from receiving this medicine? Side effects that you should report to your doctor or health care professional as soon as possible: - allergic reactions like skin rash, itching or hives, swelling of the face, lips, or tongue - breathing problems - confusion, nightmares or hallucinations - feeling faint or lightheaded, falls - irregular heartbeat - joint, muscle or tendon pain or swelling - pain or trouble passing urine -persistent headache with or without blurred vision - redness, blistering, peeling or loosening of the skin, including inside the mouth - seizure - unusual pain, numbness, tingling, or weakness Side effects that usually do not require medical attention (report to your doctor or health care professional if they continue or are bothersome): - diarrhea - nausea or stomach upset - white patches or sores in the  mouth This list may not describe all possible side effects. Call your doctor for medical advice about side effects. You may report side effects to FDA at 1-800-FDA-1088. Where should I keep my medicine? Keep out of the reach of children. Store at room temperature below 30 degrees C (86 degrees F). Keep container tightly closed. Throw away any unused medicine after the expiration date. NOTE: This sheet is a summary. It may not cover all possible information. If you have questions about this medicine, talk to your doctor, pharmacist, or health care provider.  2014, Elsevier/Gold Standard. (2011-11-01 12:53:06)

## 2013-11-04 NOTE — ED Notes (Signed)
Pt cleaned up x2 prior to d/c. Pt educated on diet and fever management if pt developed fever at home. Pt verbalized understanding.

## 2013-11-04 NOTE — ED Notes (Signed)
Cleaned patient after bowel movement.

## 2013-11-07 ENCOUNTER — Inpatient Hospital Stay (HOSPITAL_COMMUNITY)
Admission: EM | Admit: 2013-11-07 | Discharge: 2013-11-14 | DRG: 371 | Disposition: A | Discharge status: Home / Self Care | Payer: Medicaid Other | Attending: Internal Medicine | Admitting: Internal Medicine

## 2013-11-07 ENCOUNTER — Observation Stay (HOSPITAL_COMMUNITY): Payer: Medicaid Other

## 2013-11-07 ENCOUNTER — Encounter (HOSPITAL_COMMUNITY): Payer: Self-pay

## 2013-11-07 DIAGNOSIS — M1710 Unilateral primary osteoarthritis, unspecified knee: Secondary | ICD-10-CM

## 2013-11-07 DIAGNOSIS — M1711 Unilateral primary osteoarthritis, right knee: Secondary | ICD-10-CM

## 2013-11-07 DIAGNOSIS — R197 Diarrhea, unspecified: Secondary | ICD-10-CM

## 2013-11-07 DIAGNOSIS — M25819 Other specified joint disorders, unspecified shoulder: Secondary | ICD-10-CM

## 2013-11-07 DIAGNOSIS — M75120 Complete rotator cuff tear or rupture of unspecified shoulder, not specified as traumatic: Secondary | ICD-10-CM

## 2013-11-07 DIAGNOSIS — M179 Osteoarthritis of knee, unspecified: Secondary | ICD-10-CM

## 2013-11-07 DIAGNOSIS — M169 Osteoarthritis of hip, unspecified: Secondary | ICD-10-CM

## 2013-11-07 DIAGNOSIS — M542 Cervicalgia: Secondary | ICD-10-CM

## 2013-11-07 DIAGNOSIS — M1610 Unilateral primary osteoarthritis, unspecified hip: Secondary | ICD-10-CM

## 2013-11-07 DIAGNOSIS — F411 Generalized anxiety disorder: Secondary | ICD-10-CM

## 2013-11-07 DIAGNOSIS — K5289 Other specified noninfective gastroenteritis and colitis: Secondary | ICD-10-CM

## 2013-11-07 DIAGNOSIS — M1712 Unilateral primary osteoarthritis, left knee: Secondary | ICD-10-CM

## 2013-11-07 DIAGNOSIS — M1990 Unspecified osteoarthritis, unspecified site: Secondary | ICD-10-CM

## 2013-11-07 DIAGNOSIS — E785 Hyperlipidemia, unspecified: Secondary | ICD-10-CM | POA: Diagnosis present

## 2013-11-07 DIAGNOSIS — R112 Nausea with vomiting, unspecified: Secondary | ICD-10-CM

## 2013-11-07 DIAGNOSIS — Z96649 Presence of unspecified artificial hip joint: Secondary | ICD-10-CM

## 2013-11-07 DIAGNOSIS — E669 Obesity, unspecified: Secondary | ICD-10-CM | POA: Diagnosis present

## 2013-11-07 DIAGNOSIS — R21 Rash and other nonspecific skin eruption: Secondary | ICD-10-CM

## 2013-11-07 DIAGNOSIS — M25559 Pain in unspecified hip: Secondary | ICD-10-CM

## 2013-11-07 DIAGNOSIS — F329 Major depressive disorder, single episode, unspecified: Secondary | ICD-10-CM | POA: Diagnosis present

## 2013-11-07 DIAGNOSIS — J309 Allergic rhinitis, unspecified: Secondary | ICD-10-CM

## 2013-11-07 DIAGNOSIS — T368X5A Adverse effect of other systemic antibiotics, initial encounter: Secondary | ICD-10-CM | POA: Diagnosis present

## 2013-11-07 DIAGNOSIS — J189 Pneumonia, unspecified organism: Secondary | ICD-10-CM

## 2013-11-07 DIAGNOSIS — Z23 Encounter for immunization: Secondary | ICD-10-CM

## 2013-11-07 DIAGNOSIS — M7580 Other shoulder lesions, unspecified shoulder: Secondary | ICD-10-CM

## 2013-11-07 DIAGNOSIS — E876 Hypokalemia: Secondary | ICD-10-CM

## 2013-11-07 DIAGNOSIS — R109 Unspecified abdominal pain: Secondary | ICD-10-CM

## 2013-11-07 DIAGNOSIS — F3289 Other specified depressive episodes: Secondary | ICD-10-CM | POA: Diagnosis present

## 2013-11-07 DIAGNOSIS — K529 Noninfective gastroenteritis and colitis, unspecified: Secondary | ICD-10-CM | POA: Diagnosis present

## 2013-11-07 DIAGNOSIS — N951 Menopausal and female climacteric states: Secondary | ICD-10-CM

## 2013-11-07 DIAGNOSIS — A0472 Enterocolitis due to Clostridium difficile, not specified as recurrent: Principal | ICD-10-CM | POA: Diagnosis present

## 2013-11-07 DIAGNOSIS — E079 Disorder of thyroid, unspecified: Secondary | ICD-10-CM

## 2013-11-07 DIAGNOSIS — Z79899 Other long term (current) drug therapy: Secondary | ICD-10-CM

## 2013-11-07 DIAGNOSIS — M25519 Pain in unspecified shoulder: Secondary | ICD-10-CM

## 2013-11-07 DIAGNOSIS — Z6841 Body Mass Index (BMI) 40.0 and over, adult: Secondary | ICD-10-CM

## 2013-11-07 DIAGNOSIS — L270 Generalized skin eruption due to drugs and medicaments taken internally: Secondary | ICD-10-CM | POA: Diagnosis present

## 2013-11-07 DIAGNOSIS — R209 Unspecified disturbances of skin sensation: Secondary | ICD-10-CM

## 2013-11-07 DIAGNOSIS — M25469 Effusion, unspecified knee: Secondary | ICD-10-CM

## 2013-11-07 DIAGNOSIS — M6530 Trigger finger, unspecified finger: Secondary | ICD-10-CM

## 2013-11-07 DIAGNOSIS — I10 Essential (primary) hypertension: Secondary | ICD-10-CM | POA: Diagnosis present

## 2013-11-07 DIAGNOSIS — IMO0002 Reserved for concepts with insufficient information to code with codable children: Secondary | ICD-10-CM

## 2013-11-07 DIAGNOSIS — J1100 Influenza due to unidentified influenza virus with unspecified type of pneumonia: Secondary | ICD-10-CM | POA: Diagnosis present

## 2013-11-07 LAB — COMPREHENSIVE METABOLIC PANEL
ALK PHOS: 57 U/L (ref 39–117)
ALT: 16 U/L (ref 0–35)
AST: 17 U/L (ref 0–37)
Albumin: 2.5 g/dL — ABNORMAL LOW (ref 3.5–5.2)
BILIRUBIN TOTAL: 0.3 mg/dL (ref 0.3–1.2)
BUN: 7 mg/dL (ref 6–23)
CHLORIDE: 98 meq/L (ref 96–112)
CO2: 26 mEq/L (ref 19–32)
Calcium: 8 mg/dL — ABNORMAL LOW (ref 8.4–10.5)
Creatinine, Ser: 0.78 mg/dL (ref 0.50–1.10)
GFR calc Af Amer: >90 mL/min (ref >90)
GFR calc non Af Amer: 90 mL/min — ABNORMAL LOW (ref >90)
Glucose, Bld: 123 mg/dL — ABNORMAL HIGH (ref 70–99)
POTASSIUM: 3.1 meq/L — AB (ref 3.7–5.3)
Sodium: 135 mEq/L — ABNORMAL LOW (ref 137–147)
Total Protein: 5.9 g/dL — ABNORMAL LOW (ref 6.0–8.3)

## 2013-11-07 LAB — URINALYSIS, ROUTINE W REFLEX MICROSCOPIC
Glucose, UA: NEGATIVE mg/dL
HGB URINE DIPSTICK: NEGATIVE
Ketones, ur: 15 mg/dL — AB
Leukocytes, UA: NEGATIVE
Nitrite: POSITIVE — AB
PH: 5.5 (ref 5.0–8.0)
Protein, ur: 30 mg/dL — AB
Specific Gravity, Urine: >1.03 — ABNORMAL HIGH (ref 1.005–1.030)
Urobilinogen, UA: 0.2 mg/dL (ref 0.0–1.0)

## 2013-11-07 LAB — CBC WITH DIFFERENTIAL/PLATELET
BASOS ABS: 0.1 10*3/uL (ref 0.0–0.1)
Band Neutrophils: 0 % (ref 0–10)
Basophils Relative: 1 % (ref 0–1)
Blasts: 0 %
EOS ABS: 0.1 10*3/uL (ref 0.0–0.7)
EOS PCT: 1 % (ref 0–5)
HCT: 33.2 % — ABNORMAL LOW (ref 36.0–46.0)
Hemoglobin: 11.5 g/dL — ABNORMAL LOW (ref 12.0–15.0)
Lymphocytes Relative: 7 % — ABNORMAL LOW (ref 12–46)
Lymphs Abs: 1 10*3/uL (ref 0.7–4.0)
MCH: 30.9 pg (ref 26.0–34.0)
MCHC: 34.6 g/dL (ref 30.0–36.0)
MCV: 89.2 fL (ref 78.0–100.0)
METAMYELOCYTES PCT: 0 %
MYELOCYTES: 0 %
Monocytes Absolute: 1.7 10*3/uL — ABNORMAL HIGH (ref 0.1–1.0)
Monocytes Relative: 12 % (ref 3–12)
NRBC: 0 /100{WBCs}
Neutro Abs: 11.5 10*3/uL — ABNORMAL HIGH (ref 1.7–7.7)
Neutrophils Relative %: 79 % — ABNORMAL HIGH (ref 43–77)
PROMYELOCYTES ABS: 0 %
Platelets: 208 10*3/uL (ref 150–400)
RBC: 3.72 MIL/uL — AB (ref 3.87–5.11)
RDW: 13.2 % (ref 11.5–15.5)
WBC: 14.4 10*3/uL — ABNORMAL HIGH (ref 4.0–10.5)

## 2013-11-07 LAB — URINE MICROSCOPIC-ADD ON

## 2013-11-07 LAB — CLOSTRIDIUM DIFFICILE BY PCR: Toxigenic C. Difficile by PCR: NEGATIVE

## 2013-11-07 LAB — LACTIC ACID, PLASMA: Lactic Acid, Venous: 0.8 mmol/L (ref 0.5–2.2)

## 2013-11-07 LAB — LIPASE, BLOOD: Lipase: 23 U/L (ref 11–59)

## 2013-11-07 MED ORDER — METRONIDAZOLE IN NACL 5-0.79 MG/ML-% IV SOLN
INTRAVENOUS | Status: AC
  Filled 2013-11-07: qty 100

## 2013-11-07 MED ORDER — CIPROFLOXACIN IN D5W 400 MG/200ML IV SOLN
400.0000 mg | Freq: Two times a day (BID) | INTRAVENOUS | Status: DC
  Administered 2013-11-07 – 2013-11-10 (×7): 400 mg via INTRAVENOUS
  Filled 2013-11-07 (×9): qty 200

## 2013-11-07 MED ORDER — ONDANSETRON HCL 4 MG/2ML IJ SOLN
4.0000 mg | Freq: Four times a day (QID) | INTRAMUSCULAR | Status: DC | PRN
  Administered 2013-11-07 – 2013-11-09 (×4): 4 mg via INTRAVENOUS
  Filled 2013-11-07 (×2): qty 2

## 2013-11-07 MED ORDER — SODIUM CHLORIDE 0.9 % IV SOLN
1000.0000 mL | Freq: Once | INTRAVENOUS | Status: AC
  Administered 2013-11-07: 1000 mL via INTRAVENOUS

## 2013-11-07 MED ORDER — HYDROMORPHONE HCL PF 1 MG/ML IJ SOLN
1.0000 mg | INTRAMUSCULAR | Status: AC | PRN
  Administered 2013-11-07 (×3): 1 mg via INTRAVENOUS
  Filled 2013-11-07 (×3): qty 1

## 2013-11-07 MED ORDER — OXYCODONE-ACETAMINOPHEN 5-325 MG PO TABS
1.0000 | ORAL_TABLET | ORAL | Status: DC | PRN
  Administered 2013-11-07 – 2013-11-12 (×8): 1 via ORAL
  Filled 2013-11-07 (×8): qty 1

## 2013-11-07 MED ORDER — METRONIDAZOLE IN NACL 5-0.79 MG/ML-% IV SOLN
500.0000 mg | Freq: Three times a day (TID) | INTRAVENOUS | Status: DC
  Administered 2013-11-07 – 2013-11-10 (×11): 500 mg via INTRAVENOUS
  Filled 2013-11-07 (×17): qty 100

## 2013-11-07 MED ORDER — ONDANSETRON HCL 4 MG PO TABS: 4.0000 mg | ORAL_TABLET | Freq: Four times a day (QID) | ORAL | Status: DC | PRN

## 2013-11-07 MED ORDER — ENOXAPARIN SODIUM 40 MG/0.4ML ~~LOC~~ SOLN
40.0000 mg | SUBCUTANEOUS | Status: DC
  Administered 2013-11-07 – 2013-11-14 (×8): 40 mg via SUBCUTANEOUS
  Filled 2013-11-07 (×8): qty 0.4

## 2013-11-07 MED ORDER — ACETAMINOPHEN 650 MG RE SUPP: 650.0000 mg | Freq: Four times a day (QID) | RECTAL | Status: DC | PRN

## 2013-11-07 MED ORDER — AMLODIPINE BESYLATE 5 MG PO TABS
10.0000 mg | ORAL_TABLET | Freq: Every morning | ORAL | Status: DC
  Administered 2013-11-07 – 2013-11-14 (×8): 10 mg via ORAL
  Filled 2013-11-07 (×8): qty 2

## 2013-11-07 MED ORDER — HYDROMORPHONE HCL PF 1 MG/ML IJ SOLN
1.0000 mg | Freq: Once | INTRAMUSCULAR | Status: AC
  Administered 2013-11-07: 1 mg via INTRAVENOUS
  Filled 2013-11-07: qty 1

## 2013-11-07 MED ORDER — TRAZODONE HCL 50 MG PO TABS
100.0000 mg | ORAL_TABLET | Freq: Every day | ORAL | Status: DC
  Administered 2013-11-07 – 2013-11-13 (×7): 100 mg via ORAL
  Filled 2013-11-07 (×7): qty 2

## 2013-11-07 MED ORDER — CITALOPRAM HYDROBROMIDE 20 MG PO TABS
40.0000 mg | ORAL_TABLET | Freq: Every morning | ORAL | Status: DC
  Administered 2013-11-07 – 2013-11-14 (×8): 40 mg via ORAL
  Filled 2013-11-07 (×8): qty 2

## 2013-11-07 MED ORDER — ALPRAZOLAM 1 MG PO TABS
1.0000 mg | ORAL_TABLET | Freq: Three times a day (TID) | ORAL | Status: DC | PRN
  Administered 2013-11-08 – 2013-11-11 (×2): 1 mg via ORAL
  Filled 2013-11-07 (×2): qty 1

## 2013-11-07 MED ORDER — POTASSIUM CHLORIDE 10 MEQ/100ML IV SOLN
10.0000 meq | Freq: Once | INTRAVENOUS | Status: AC
  Administered 2013-11-07: 10 meq via INTRAVENOUS
  Filled 2013-11-07: qty 100

## 2013-11-07 MED ORDER — DIPHENHYDRAMINE HCL 25 MG PO CAPS
25.0000 mg | ORAL_CAPSULE | Freq: Four times a day (QID) | ORAL | Status: DC | PRN
  Administered 2013-11-07 – 2013-11-10 (×4): 25 mg via ORAL
  Filled 2013-11-07 (×4): qty 1

## 2013-11-07 MED ORDER — ACETAMINOPHEN 325 MG PO TABS
650.0000 mg | ORAL_TABLET | Freq: Four times a day (QID) | ORAL | Status: DC | PRN
  Administered 2013-11-11: 650 mg via ORAL
  Filled 2013-11-07: qty 2

## 2013-11-07 MED ORDER — SODIUM CHLORIDE 0.9 % IV SOLN
1000.0000 mL | INTRAVENOUS | Status: DC
  Administered 2013-11-07: 1000 mL via INTRAVENOUS

## 2013-11-07 MED ORDER — GABAPENTIN 300 MG PO CAPS
300.0000 mg | ORAL_CAPSULE | Freq: Three times a day (TID) | ORAL | Status: DC
  Administered 2013-11-07 – 2013-11-14 (×22): 300 mg via ORAL
  Filled 2013-11-07 (×23): qty 1

## 2013-11-07 MED ORDER — INFLUENZA VAC SPLIT QUAD 0.5 ML IM SUSP
0.5000 mL | INTRAMUSCULAR | Status: AC
  Administered 2013-11-08: 0.5 mL via INTRAMUSCULAR
  Filled 2013-11-07: qty 0.5

## 2013-11-07 MED ORDER — POTASSIUM CHLORIDE CRYS ER 20 MEQ PO TBCR
40.0000 meq | EXTENDED_RELEASE_TABLET | Freq: Once | ORAL | Status: AC
  Administered 2013-11-07: 40 meq via ORAL
  Filled 2013-11-07: qty 2

## 2013-11-07 MED ORDER — SIMVASTATIN 10 MG PO TABS
10.0000 mg | ORAL_TABLET | Freq: Every day | ORAL | Status: DC
Start: 2013-11-07 — End: 2013-11-14
  Administered 2013-11-07 – 2013-11-13 (×7): 10 mg via ORAL
  Filled 2013-11-07 (×7): qty 1

## 2013-11-07 MED ORDER — ONDANSETRON HCL 4 MG/2ML IJ SOLN
4.0000 mg | Freq: Once | INTRAMUSCULAR | Status: AC
  Administered 2013-11-07: 4 mg via INTRAVENOUS
  Filled 2013-11-07: qty 2

## 2013-11-07 MED ORDER — KCL IN DEXTROSE-NACL 20-5-0.9 MEQ/L-%-% IV SOLN
INTRAVENOUS | Status: DC
  Administered 2013-11-07 – 2013-11-08 (×4): via INTRAVENOUS

## 2013-11-07 MED ORDER — ONDANSETRON HCL 4 MG/2ML IJ SOLN
4.0000 mg | Freq: Three times a day (TID) | INTRAMUSCULAR | Status: AC | PRN
  Filled 2013-11-07 (×2): qty 2

## 2013-11-07 MED ORDER — CLONIDINE HCL 0.1 MG PO TABS
0.1000 mg | ORAL_TABLET | Freq: Every evening | ORAL | Status: DC
  Administered 2013-11-07 – 2013-11-13 (×7): 0.1 mg via ORAL
  Filled 2013-11-07 (×8): qty 1

## 2013-11-07 NOTE — Progress Notes (Signed)
[facility] PROGRESS NOTE  Rachel [name] TOI:[id number] DOB: September 04, 1956 DOA: 11/07/2013 PCP: Maggie Font, MD  Summary: 58 year old woman recently seen 1/8 for abdominal pain, diagnosed with colitis and discharged on Cipro and Flagyl. Her symptoms fail to improve, she presented again 1/10 with persistent abdominal pain, nausea and vomiting. Admitted for diffuse colitis as seen on CT.   Assessment/Plan: 1. Diffuse acute colitis. Likely infectious based on appearance, elevated WBC. Send stool studies when able. She was only able to take a few doses of Cipro and Flagyl, therefore would not consider this a treatment failure.  2. Hypokalemia. 3. On CT Central uterine hypoattenuation. Followup with outpatient pelvic ultrasound.   Continue empiric antibiotics Cipro, Flagyl.  Send stool studies if possible.  Replete potassium  Pending studies:   GI pathogen panel  C. difficile PCR  Code Status: full code DVT prophylaxis: Lovenox Family Communication: none present Disposition Plan: home when improved  Murray Hodgkins, MD  [facility]  Pager [phone] If 7PM-7AM, please contact night-coverage at [email or web address], password [facility] 11/07/2013, 10:40 AM  LOS: 0 days   Consultants:    Procedures:    Antibiotics:  Cipro 1/11 >>   Flagyl 1/11 >>   HPI/Subjective: No diarrhea since admission. Some vomiting but tolerated liquids this morning. Continues to have lower abdominal pain.  Objective: Filed Vitals:   11/07/13 0157 11/07/13 0459 11/07/13 0534  BP: 148/55 126/48 138/51  Pulse: 90 89 85  Temp: 98.8 F (37.1 C) 98.5 F (36.9 C) 98.4 F (36.9 C)  TempSrc: Oral Oral Oral  Resp: 24 20 20   Height: 5\' 4"  (1.626 m)  5\' 4"  (1.626 m)  Weight: 91.173 kg (201 lb)  110.904 kg (244 lb 8 oz)  SpO2: 99% 93% 95%    Intake/Output Summary (Last 24 hours) at 11/07/13 1040 Last data filed at 11/07/13 0600  Gross per 24 hour  Intake    115 ml  Output      0 ml   Net    115 ml     Filed Weights   11/07/13 0157 11/07/13 0534  Weight: 91.173 kg (201 lb) 110.904 kg (244 lb 8 oz)    Exam:   Afebrile, vital signs stable. No hypoxia.  General: Appears uncomfortable but not toxic. Calm.  Cardiovascular: Regular rate and rhythm. No murmur, rub or gallop. No lower extremity edema.  Respiratory: Clear to auscultation bilaterally. No wheezes, rales or rhonchi. Normal respiratory effort.  Abdomen: Positive bowel sounds. Soft, nondistended. Obese. Mild lower abdominal pain. Skin appears unremarkable.  Data Reviewed:  Potassium 3.1, sodium 135. Complete metabolic panel otherwise unremarkable.  Lactic acid normal.  WBC 14.4, hemoglobin 11.5. Left shift seen.  CT abdomen pelvis 1/8: Diffuse colitis, infection favored. Central uterine hypoattenuation. Followup with outpatient pelvic ultrasound.  Scheduled Meds: . amLODipine  10 mg Oral q morning - 10a  . ciprofloxacin  400 mg Intravenous Q12H  . citalopram  40 mg Oral q morning - 10a  . cloNIDine  0.1 mg Oral QPM  . enoxaparin (LOVENOX) injection  40 mg Subcutaneous Q24H  . gabapentin  300 mg Oral TID  . metronidazole  500 mg Intravenous Q8H  . simvastatin  10 mg Oral q1800  . trazodone  100 mg Oral QHS   Continuous Infusions: . dextrose 5 % and 0.9 % NaCl with KCl 20 mEq/L 100 mL/hr at 11/07/13 0551    Principal Problem:   Acute colitis Active Problems:   ANXIETY   HYPERTENSION   Abdominal pain  Time spent 20 minutes
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
## 2013-11-07 NOTE — ED Notes (Signed)
Here 3 days ago for the same. meds are not working per patient

## 2013-11-07 NOTE — Progress Notes (Signed)
ANTIBIOTIC CONSULT NOTE - INITIAL  Pharmacy Consult for ciprofloxacin Indication: colitis  Allergies  Allergen Reactions  . Lisinopril     REACTION: Cough  . Prednisone     REACTION: sick on the stomach  . Betadine [Povidone Iodine] Rash    Patient Measurements: Height: 5\' 4"  (162.6 cm) Weight: 201 lb (91.173 kg) IBW/kg (Calculated) : 54.7  Vital Signs: Temp: 98.5 F (36.9 C) (01/11 0459) Temp src: Oral (01/11 0459) BP: 126/48 mmHg (01/11 0459) Pulse Rate: 89 (01/11 0459) Intake/Output from previous day:   Intake/Output from this shift:    Labs:  Recent Labs  11/07/13 0303  WBC 14.4*  HGB 11.5*  PLT 208  CREATININE 0.78   Estimated Creatinine Clearance: 82.8 ml/min (by C-G formula based on Cr of 0.78). No results found for this basename: VANCOTROUGH, VANCOPEAK, VANCORANDOM, GENTTROUGH, GENTPEAK, GENTRANDOM, TOBRATROUGH, TOBRAPEAK, TOBRARND, AMIKACINPEAK, AMIKACINTROU, AMIKACIN,  in the last 72 hours   Microbiology: No results found for this or any previous visit (from the past 720 hour(s)).  Medical History: Past Medical History  Diagnosis Date  . Rupture of rotator cuff, complete   . Impingement syndrome of left shoulder   . Shoulder pain   . Subluxation of radial head   . Hip pain, right   . Obesity   . Osteoarthritis   . Medial meniscus tear     left   . Hypertension   . Hyperlipidemia   . Depression   . Anxiety     Medications:  Scheduled:  . amLODipine  10 mg Oral q morning - 10a  . ciprofloxacin  400 mg Intravenous Q12H  . citalopram  40 mg Oral q morning - 10a  . cloNIDine  0.1 mg Oral QPM  . enoxaparin (LOVENOX) injection  40 mg Subcutaneous Q24H  . gabapentin  300 mg Oral TID  . metronidazole  500 mg Intravenous Q8H  . potassium chloride  10 mEq Intravenous Once  . simvastatin  10 mg Oral q1800  . trazodone  100 mg Oral QHS   Infusions:  . dextrose 5 % and 0.9 % NaCl with KCl 20 mEq/L     PRN: acetaminophen, acetaminophen,  ALPRAZolam, HYDROmorphone (DILAUDID) injection, ondansetron (ZOFRAN) IV, ondansetron (ZOFRAN) IV, ondansetron, oxyCODONE-acetaminophen  Assessment: 36yr female who was supposed to start oral cipro/flagyl tx for colitis 11/04/13 (not sure if started or not) now admitted for IV tx with same drugs.  Renal function appropriate for age- CrCl 65-31ml/min.  Patient with decreased serum albumin however, may have some 3rd spacing due to capillary leakage.  Goal of Therapy:  Metronidazole 500mg  IV q8h and ciprofloxacin 400mg  IV q12h already started   Plan:  Follow for further work-up of colitis dx, and any changes in renal/ hepatic function.  Danie Binder E 11/07/2013,5:34 AM
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
## 2013-11-08 LAB — BASIC METABOLIC PANEL
BUN: 5 mg/dL — ABNORMAL LOW (ref 6–23)
CHLORIDE: 101 meq/L (ref 96–112)
CO2: 23 meq/L (ref 19–32)
CREATININE: 0.81 mg/dL (ref 0.50–1.10)
Calcium: 8 mg/dL — ABNORMAL LOW (ref 8.4–10.5)
GFR calc non Af Amer: 78 mL/min — ABNORMAL LOW (ref >90)
GLUCOSE: 134 mg/dL — AB (ref 70–99)
POTASSIUM: 3.5 meq/L — AB (ref 3.7–5.3)
Sodium: 135 mEq/L — ABNORMAL LOW (ref 137–147)

## 2013-11-08 LAB — GI PATHOGEN PANEL BY PCR, STOOL
C difficile toxin A/B: NEGATIVE
Campylobacter by PCR: NEGATIVE
Cryptosporidium by PCR: NEGATIVE
E COLI (STEC): NEGATIVE
E coli (ETEC) LT/ST: NEGATIVE
E coli 0157 by PCR: NEGATIVE
G LAMBLIA BY PCR: NEGATIVE
NOROVIRUS G1/G2: NEGATIVE
Rotavirus A by PCR: NEGATIVE
Salmonella by PCR: NEGATIVE
Shigella by PCR: NEGATIVE

## 2013-11-08 LAB — CBC
HCT: 32.3 % — ABNORMAL LOW (ref 36.0–46.0)
HEMOGLOBIN: 11.4 g/dL — AB (ref 12.0–15.0)
MCH: 32 pg (ref 26.0–34.0)
MCHC: 35.3 g/dL (ref 30.0–36.0)
MCV: 90.7 fL (ref 78.0–100.0)
Platelets: 213 10*3/uL (ref 150–400)
RBC: 3.56 MIL/uL — AB (ref 3.87–5.11)
RDW: 13.6 % (ref 11.5–15.5)
WBC: 11.2 10*3/uL — AB (ref 4.0–10.5)

## 2013-11-08 NOTE — Care Management Note (Deleted)
    Page 1 of 2   11/11/2013     11:53:47 AM   CARE MANAGEMENT NOTE 11/11/2013  Patient:  Rachel [name], Rachel [name]   Account Number:  [id number]  Date Initiated:  11/08/2013  Documentation initiated by:  Theophilus Kinds  Subjective/Objective Assessment:   Pt admitted from home with colitis. Pt lives alone but has family that is in and out of the home. Pt is fairly independent with ADl's. Has a cane and walker for home use.     Action/Plan:   No CM needs noted.   Anticipated DC Date:  11/11/2013   Anticipated DC Plan:  [place]  CM consult      PAC Choice  [place]   Choice offered to / List presented to:  C-1 Patient   DME arranged  [place]      DME agency  [place] arranged  HH-1 RN  [place].   Status of service:  Completed, signed off Medicare Important Message given?  YES (If response is "NO", the following Medicare IM given date fields will be blank) Date Medicare IM given:  11/11/2013 Date Additional Medicare IM given:    Discharge Disposition:  HOME/SELF CARE  Per UR Regulation:    If discussed at Long Length of Stay Meetings, dates discussed:   11/11/2013    Comments:  11/11/13 [place], RN BSN CM Pt discharged home today with [facility] Rn and PT. Romualdo Bolk is aware and will collect the pts information from the chart. Pt will have a wheelchair and slide board delivered to pts room prior to discharge by [facility] Minor services to start within 48 hours of discharge. Pt and pts nurse aware of discharge arrangements.  11/08/13 [place], RN BSN CM

## 2013-11-08 NOTE — Progress Notes (Signed)
[facility] PROGRESS NOTE  Rachel [name] ZGY:[id number] DOB: [birth year] DOA: 11/07/2013 PCP: Maggie Font, MD  Summary: 58 year old woman recently seen 1/8 for abdominal pain, diagnosed with colitis and discharged on Cipro and Flagyl. Her symptoms fail to improve, she presented again 1/10 with persistent abdominal pain, nausea and vomiting. Admitted for diffuse colitis as seen on CT.   Assessment/Plan: 1. Diffuse acute colitis. Slowly improving, afebrile, decreasing white blood cell count . C. difficile negative. 2. Hypokalemia. 3. On CT Central uterine hypoattenuation. Followup with outpatient pelvic ultrasound.   Continue empiric antibiotics for presumed infectious colitis  Replete potassium (in IVF)  Basic metabolic panel, magnesium and CBC in the morning  Pending studies:   GI pathogen panel  Code Status: full code DVT prophylaxis: Lovenox Family Communication: none present Disposition Plan: home when improved  Murray Hodgkins, MD  [facility]  Pager [phone] If 7PM-7AM, please contact night-coverage at [email or web address], password [facility] 11/08/2013, 1:37 PM  LOS: 1 day   Consultants:    Procedures:    Antibiotics:  Cipro 1/11 >>   Flagyl 1/11 >>   HPI/Subjective: Somewhat better today but still having diarrhea. Abdominal pain had improved until she tried liquids. No vomiting.  Objective: Filed Vitals:   11/07/13 0534 11/07/13 1449 11/07/13 2026 11/08/13 0453  BP: 138/51 156/57 140/53 146/55  Pulse: 85 96 101 98  Temp: 98.4 F (36.9 C) 99.6 F (37.6 C) 98.8 F (37.1 C) 98.5 F (36.9 C)  TempSrc: Oral Oral Oral Oral  Resp: 20 20 21 20   Height: 5\' 4"  (1.626 m)     Weight: 110.904 kg (244 lb 8 oz)     SpO2: 95% 98% 95% 96%    Intake/Output Summary (Last 24 hours) at 11/08/13 1337 Last data filed at 11/08/13 1200  Gross per 24 hour  Intake   3120 ml  Output   1580 ml  Net   1540 ml     Filed Weights   11/07/13 0157 11/07/13  0534  Weight: 91.173 kg (201 lb) 110.904 kg (244 lb 8 oz)    Exam:   Afebrile, vital signs stable. No hypoxia.  General: Appears calm and comfortable. Speech fluent and clear.  Cardiovascular: Regular rate and rhythm. No murmur, rub or gallop.  Respiratory: Clear to auscultation bilaterally. No wheezes, rales or rhonchi. Normal respiratory effort.  Abdomen positive bowel sounds, soft, nontender  Data Reviewed:  Good urine output.  Potassium 3.5.  White blood cell count trending downwards, 11.2.  Scheduled Meds: . amLODipine  10 mg Oral q morning - 10a  . ciprofloxacin  400 mg Intravenous Q12H  . citalopram  40 mg Oral q morning - 10a  . cloNIDine  0.1 mg Oral QPM  . enoxaparin (LOVENOX) injection  40 mg Subcutaneous Q24H  . gabapentin  300 mg Oral TID  . influenza vac split quadrivalent PF  0.5 mL Intramuscular Tomorrow-1000  . metronidazole  500 mg Intravenous Q8H  . simvastatin  10 mg Oral q1800  . trazodone  100 mg Oral QHS   Continuous Infusions: . dextrose 5 % and 0.9 % NaCl with KCl 20 mEq/L 100 mL/hr at 11/08/13 6659    Principal Problem:   Acute colitis Active Problems:   ANXIETY   HYPERTENSION   Abdominal pain   Colitis   Time spent 20 minutes

## 2013-11-09 LAB — BASIC METABOLIC PANEL
BUN: 3 mg/dL — AB (ref 6–23)
CHLORIDE: 105 meq/L (ref 96–112)
CO2: 26 meq/L (ref 19–32)
CREATININE: 0.85 mg/dL (ref 0.50–1.10)
Calcium: 8.6 mg/dL (ref 8.4–10.5)
GFR calc non Af Amer: 74 mL/min — ABNORMAL LOW (ref >90)
GFR, EST AFRICAN AMERICAN: 85 mL/min — AB (ref >90)
GLUCOSE: 121 mg/dL — AB (ref 70–99)
POTASSIUM: 3.5 meq/L — AB (ref 3.7–5.3)
Sodium: 140 mEq/L (ref 137–147)

## 2013-11-09 LAB — CBC
HEMATOCRIT: 36.4 % (ref 36.0–46.0)
HEMOGLOBIN: 12.6 g/dL (ref 12.0–15.0)
MCH: 31.8 pg (ref 26.0–34.0)
MCHC: 34.6 g/dL (ref 30.0–36.0)
MCV: 91.9 fL (ref 78.0–100.0)
Platelets: 250 10*3/uL (ref 150–400)
RBC: 3.96 MIL/uL (ref 3.87–5.11)
RDW: 13.6 % (ref 11.5–15.5)
WBC: 8.3 10*3/uL (ref 4.0–10.5)

## 2013-11-09 LAB — MAGNESIUM: Magnesium: 1.8 mg/dL (ref 1.5–2.5)

## 2013-11-09 MED ORDER — RISAQUAD PO CAPS
2.0000 | ORAL_CAPSULE | Freq: Every day | ORAL | Status: DC
  Administered 2013-11-09 – 2013-11-14 (×6): 2 via ORAL
  Filled 2013-11-09 (×6): qty 2

## 2013-11-09 MED ORDER — POTASSIUM CHLORIDE CRYS ER 20 MEQ PO TBCR
40.0000 meq | EXTENDED_RELEASE_TABLET | Freq: Once | ORAL | Status: AC
  Administered 2013-11-09: 40 meq via ORAL
  Filled 2013-11-09: qty 2

## 2013-11-09 MED ORDER — POTASSIUM CHLORIDE 10 MEQ/100ML IV SOLN
10.0000 meq | Freq: Once | INTRAVENOUS | Status: AC
  Administered 2013-11-09: 10 meq via INTRAVENOUS

## 2013-11-09 MED ORDER — SODIUM CHLORIDE 0.9 % IJ SOLN
3.0000 mL | INTRAMUSCULAR | Status: DC | PRN
Start: 2013-11-09 — End: 2013-11-14
  Administered 2013-11-09: 3 mL via INTRAVENOUS

## 2013-11-09 MED ORDER — SODIUM CHLORIDE 0.9 % IJ SOLN
3.0000 mL | Freq: Two times a day (BID) | INTRAMUSCULAR | Status: DC
  Administered 2013-11-09 – 2013-11-13 (×5): 3 mL via INTRAVENOUS

## 2013-11-09 MED ORDER — SODIUM CHLORIDE 0.9 % IV SOLN
250.0000 mL | INTRAVENOUS | Status: DC | PRN
  Administered 2013-11-09: 250 mL via INTRAVENOUS

## 2013-11-09 MED ORDER — SODIUM CHLORIDE 0.9 % IV SOLN: Freq: Once | INTRAVENOUS | Status: DC

## 2013-11-09 MED ORDER — DIPHENOXYLATE-ATROPINE 2.5-0.025 MG PO TABS
2.0000 | ORAL_TABLET | Freq: Once | ORAL | Status: AC
  Administered 2013-11-09: 2 via ORAL
  Filled 2013-11-09: qty 2

## 2013-11-09 MED ORDER — POTASSIUM CHLORIDE 10 MEQ/100ML IV SOLN
10.0000 meq | INTRAVENOUS | Status: AC
  Administered 2013-11-09 (×3): 10 meq via INTRAVENOUS
  Filled 2013-11-09 (×4): qty 100

## 2013-11-09 NOTE — Progress Notes (Signed)
[facility] PROGRESS NOTE  Rachel [name] [id number]:[id number] DOB: 1956/10/13 DOA: 11/07/2013 PCP: Maggie Font, MD  Summary: 58 year old woman recently seen 1/8 for abdominal pain, diagnosed with colitis and discharged on Cipro and Flagyl. Her symptoms fail to improve, she presented again 1/10 with persistent abdominal pain, nausea and vomiting. Admitted for diffuse colitis as seen on CT. she is gradually improving on Cipro and Flagyl. She was only able to take a few doses of this medication as an outpatient and therefore this is not consider treatment failure. Anticipate discharge next 48 hours if continues to improve.  Assessment/Plan: 1. Diffuse acute colitis. Continues to improve, remains afebrile, white blood cell count now normal. C. difficile negative. GI pathogen panel negative. 2. Hypokalemia. Magnesium normal. 3. Central uterine hypoattenuation seen on CT. Followup with non-urgent pelvic ultrasound as an outpatient.   Improving. Antibiotics for presumed infectious colitis. Advance diet.  Replete potassium  Basic metabolic panel in the morning  Code Status: full code DVT prophylaxis: Lovenox Family Communication: none present Disposition Plan: home when improved  Murray Hodgkins, MD  [facility]  Pager [phone] If 7PM-7AM, please contact night-coverage at [email or web address], password [facility] 11/09/2013, 11:03 AM  LOS: 2 days   Consultants:    Procedures:    Antibiotics:  Cipro 1/11 >>   Flagyl 1/11 >>   HPI/Subjective: Slowly improving. Less pain and diarrhea. Tolerating diet, would like to advance.  Objective: Filed Vitals:   11/07/13 2026 11/08/13 0453 11/08/13 1648 11/09/13 0429  BP: 140/53 146/55 163/68 114/73  Pulse: 101 98 94 92  Temp: 98.8 F (37.1 C) 98.5 F (36.9 C) 98.8 F (37.1 C) 98.9 F (37.2 C)  TempSrc: Oral Oral Oral Oral  Resp: 21 20 20 18   Height:      Weight:      SpO2: 95% 96% 93% 95%    Intake/Output Summary (Last 24  hours) at 11/09/13 1103 Last data filed at 11/09/13 0829  Gross per 24 hour  Intake   4095 ml  Output    550 ml  Net   3545 ml     Filed Weights   11/07/13 0157 11/07/13 0534  Weight: 91.173 kg (201 lb) 110.904 kg (244 lb 8 oz)    Exam:   Afebrile, vital signs stable. No hypoxia.  General: Appears better today. More comfortable. Sitting in chair.  Cardiovascular: Regular rate and rhythm. No murmur, rub or gallop.  Respiratory: Clear to auscultation bilaterally. No wheezes, rales or rhonchi. Normal respiratory effort.  Abdomen: Positive bowel sounds, soft, mild lower tenderness.  Psychiatric grossly normal mood and affect. Speech fluent and appropriate.  Data Reviewed:  Good urine output.  Potassium 3.5.  White blood cell count trending dow now normal.  GI pathogen panel negative  Scheduled Meds: . amLODipine  10 mg Oral q morning - 10a  . ciprofloxacin  400 mg Intravenous Q12H  . citalopram  40 mg Oral q morning - 10a  . cloNIDine  0.1 mg Oral QPM  . enoxaparin (LOVENOX) injection  40 mg Subcutaneous Q24H  . gabapentin  300 mg Oral TID  . metronidazole  500 mg Intravenous Q8H  . potassium chloride  10 mEq Intravenous Q1 Hr x 4  . simvastatin  10 mg Oral q1800  . sodium chloride  3 mL Intravenous Q12H  . trazodone  100 mg Oral QHS   Continuous Infusions:    Principal Problem:   Acute colitis Active Problems:   ANXIETY   HYPERTENSION   Abdominal pain  Colitis   Time spent 15 minutes

## 2013-11-10 DIAGNOSIS — I10 Essential (primary) hypertension: Secondary | ICD-10-CM

## 2013-11-10 LAB — BASIC METABOLIC PANEL
BUN: 3 mg/dL — AB (ref 6–23)
CO2: 25 mEq/L (ref 19–32)
CREATININE: 0.81 mg/dL (ref 0.50–1.10)
Calcium: 8.1 mg/dL — ABNORMAL LOW (ref 8.4–10.5)
Chloride: 105 mEq/L (ref 96–112)
GFR calc Af Amer: >90 mL/min (ref >90)
GFR, EST NON AFRICAN AMERICAN: 78 mL/min — AB (ref >90)
Glucose, Bld: 106 mg/dL — ABNORMAL HIGH (ref 70–99)
POTASSIUM: 3.9 meq/L (ref 3.7–5.3)
Sodium: 139 mEq/L (ref 137–147)

## 2013-11-10 LAB — CLOSTRIDIUM DIFFICILE BY PCR: Toxigenic C. Difficile by PCR: POSITIVE — AB

## 2013-11-10 MED ORDER — LOPERAMIDE HCL 2 MG PO CAPS
2.0000 mg | ORAL_CAPSULE | ORAL | Status: DC | PRN
  Administered 2013-11-10: 2 mg via ORAL
  Filled 2013-11-10: qty 1

## 2013-11-10 MED ORDER — DIPHENHYDRAMINE HCL 25 MG PO CAPS
25.0000 mg | ORAL_CAPSULE | ORAL | Status: DC | PRN
  Administered 2013-11-10 – 2013-11-12 (×5): 25 mg via ORAL
  Filled 2013-11-10 (×5): qty 1

## 2013-11-10 MED ORDER — CAMPHOR-MENTHOL 0.5-0.5 % EX LOTN
TOPICAL_LOTION | CUTANEOUS | Status: AC
  Filled 2013-11-10: qty 222

## 2013-11-10 MED ORDER — CIPROFLOXACIN HCL 250 MG PO TABS
500.0000 mg | ORAL_TABLET | Freq: Once | ORAL | Status: AC
  Administered 2013-11-10: 500 mg via ORAL
  Filled 2013-11-10: qty 2

## 2013-11-10 MED ORDER — METRONIDAZOLE 500 MG PO TABS
500.0000 mg | ORAL_TABLET | Freq: Once | ORAL | Status: AC
  Administered 2013-11-10: 500 mg via ORAL
  Filled 2013-11-10: qty 1

## 2013-11-10 MED ORDER — CAMPHOR-MENTHOL 0.5-0.5 % EX LOTN
TOPICAL_LOTION | CUTANEOUS | Status: DC | PRN
  Administered 2013-11-10: 21:00:00 via TOPICAL
  Filled 2013-11-10: qty 222

## 2013-11-10 NOTE — Progress Notes (Signed)
[facility] PROGRESS NOTE  Rachel [name] [id number]:[id number] DOB: [birth year] DOA: 11/07/2013 PCP: Maggie Font, MD  Assessment/Plan: 58 y.o. female with PMH of HTN, HPL, anxiety, depression found to have colitis on 11/04/13 was sent home on cipro+flagyl presented with worsening abdominal pain, nausea vomiting   1. Colitis; CT (11/04/13): Diffuse colitis. Favor infection. Clinically exclude C difficile; exam: no s/s of acute abdomen  -persistent diarrhea; c diff, GI panel neg; on IV atx, IVF, pain control; antiemetics,  -recheck c diff; obtain GI eval; prn imodium  2. Hypo K likely GI loss; replace prn  3. HTN cont home regimen;  4. Central uterine hypoattenuation seen on CT. Followup with non-urgent pelvic ultrasound as an outpatient.    Code Status: full Family Communication: d/w patient  (indicate person spoken with, relationship, and if by phone, the number) Disposition Plan: home 24-48 hours    Consultants:  GI  Procedures:  None;   Antibiotics: Cipro 1/11 >>  Flagyl 1/11 >>    (indicate start date, and stop date if known)  HPI/Subjective: Alert  Objective: Filed Vitals:   11/10/13 0407  BP: 116/50  Pulse: 90  Temp: 99 F (37.2 C)  Resp: 20    Intake/Output Summary (Last 24 hours) at 11/10/13 0814 Last data filed at 11/09/13 2216  Gross per 24 hour  Intake   1243 ml  Output    250 ml  Net    993 ml   Filed Weights   11/07/13 0157 11/07/13 0534  Weight: 91.173 kg (201 lb) 110.904 kg (244 lb 8 oz)    Exam:   General:  aletr  Cardiovascular: s1,s2 rrr  Respiratory: CTA BL  Abdomen: soft, nt, nd   Musculoskeletal: no LE edema   Data Reviewed: Basic Metabolic Panel:  Recent Labs Lab 11/04/13 0450 11/07/13 0303 11/08/13 0457 11/09/13 0522 11/10/13 0515  NA 135* 135* 135* 140 139  K 3.5* 3.1* 3.5* 3.5* 3.9  CL 103 98 101 105 105  CO2 19 26 23 26 25   GLUCOSE 131* 123* 134* 121* 106*  BUN 7 7 5* 3* 3*  CREATININE 0.76 0.78 0.81  0.85 0.81  CALCIUM 8.4 8.0* 8.0* 8.6 8.1*  MG  --   --   --  1.8  --    Liver Function Tests:  Recent Labs Lab 11/04/13 0450 11/07/13 0303  AST 21 17  ALT 18 16  ALKPHOS 52 57  BILITOT 0.5 0.3  PROT 6.4 5.9*  ALBUMIN 2.8* 2.5*    Recent Labs Lab 11/04/13 0450 11/07/13 0303  LIPASE 53 23   No results found for this basename: AMMONIA,  in the last 168 hours CBC:  Recent Labs Lab 11/04/13 0450 11/07/13 0303 11/08/13 0457 11/09/13 0522  WBC 13.3* 14.4* 11.2* 8.3  NEUTROABS 11.0* 11.5*  --   --   HGB 12.4 11.5* 11.4* 12.6  HCT 37.2 33.2* 32.3* 36.4  MCV 91.6 89.2 90.7 91.9  PLT 189 208 213 250   Cardiac Enzymes: No results found for this basename: CKTOTAL, CKMB, CKMBINDEX, TROPONINI,  in the last 168 hours BNP (last 3 results) No results found for this basename: PROBNP,  in the last 8760 hours CBG: No results found for this basename: GLUCAP,  in the last 168 hours  Recent Results (from the past 240 hour(s))  CLOSTRIDIUM DIFFICILE BY PCR     Status: None   Collection Time    11/07/13 11:54 AM      Result Value Range Status  C difficile by pcr NEGATIVE  NEGATIVE Final     Studies: No results found.  Scheduled Meds: . acidophilus  2 capsule Oral Daily  . amLODipine  10 mg Oral q morning - 10a  . ciprofloxacin  400 mg Intravenous Q12H  . citalopram  40 mg Oral q morning - 10a  . cloNIDine  0.1 mg Oral QPM  . enoxaparin (LOVENOX) injection  40 mg Subcutaneous Q24H  . gabapentin  300 mg Oral TID  . metronidazole  500 mg Intravenous Q8H  . simvastatin  10 mg Oral q1800  . sodium chloride  3 mL Intravenous Q12H  . trazodone  100 mg Oral QHS   Continuous Infusions:   Principal Problem:   Acute colitis Active Problems:   Abdominal pain   ANXIETY   HYPERTENSION   Colitis    Time spent: >35 minutes     [name]  [facility] Pager [phone]. If 7PM-7AM, please contact night-coverage at [email or web address], password [facility] 11/10/2013, 8:14  AM  LOS: 3 days

## 2013-11-11 ENCOUNTER — Inpatient Hospital Stay (HOSPITAL_COMMUNITY): Payer: Medicaid Other

## 2013-11-11 LAB — INFLUENZA PANEL BY PCR (TYPE A & B)
H1N1 flu by pcr: NOT DETECTED
Influenza A By PCR: POSITIVE — AB
Influenza B By PCR: NEGATIVE

## 2013-11-11 MED ORDER — VANCOMYCIN HCL IN DEXTROSE 1-5 GM/200ML-% IV SOLN
INTRAVENOUS | Status: AC
  Filled 2013-11-11: qty 400

## 2013-11-11 MED ORDER — ALBUTEROL SULFATE (2.5 MG/3ML) 0.083% IN NEBU
5.0000 mg | INHALATION_SOLUTION | Freq: Once | RESPIRATORY_TRACT | Status: AC
  Administered 2013-11-11: 5 mg via RESPIRATORY_TRACT
  Filled 2013-11-11: qty 6

## 2013-11-11 MED ORDER — VANCOMYCIN 50 MG/ML ORAL SOLUTION
125.0000 mg | Freq: Four times a day (QID) | ORAL | Status: DC
  Administered 2013-11-11 – 2013-11-14 (×12): 125 mg via ORAL
  Filled 2013-11-11 (×17): qty 2.5

## 2013-11-11 MED ORDER — OSELTAMIVIR PHOSPHATE 75 MG PO CAPS
75.0000 mg | ORAL_CAPSULE | Freq: Two times a day (BID) | ORAL | Status: DC
  Administered 2013-11-11 – 2013-11-14 (×6): 75 mg via ORAL
  Filled 2013-11-11 (×6): qty 1

## 2013-11-11 MED ORDER — ALBUTEROL SULFATE (2.5 MG/3ML) 0.083% IN NEBU
2.5000 mg | INHALATION_SOLUTION | RESPIRATORY_TRACT | Status: DC | PRN
  Filled 2013-11-11: qty 3

## 2013-11-11 MED ORDER — VANCOMYCIN HCL IN DEXTROSE 1-5 GM/200ML-% IV SOLN
1000.0000 mg | INTRAVENOUS | Status: AC
  Administered 2013-11-11 (×2): 1000 mg via INTRAVENOUS
  Filled 2013-11-11 (×2): qty 200

## 2013-11-11 MED ORDER — IPRATROPIUM BROMIDE 0.02 % IN SOLN
0.5000 mg | Freq: Once | RESPIRATORY_TRACT | Status: AC
  Administered 2013-11-11: 0.5 mg via RESPIRATORY_TRACT
  Filled 2013-11-11: qty 2.5

## 2013-11-11 MED ORDER — PIPERACILLIN-TAZOBACTAM 3.375 G IVPB
3.3750 g | Freq: Three times a day (TID) | INTRAVENOUS | Status: DC
  Administered 2013-11-11 – 2013-11-13 (×5): 3.375 g via INTRAVENOUS
  Filled 2013-11-11 (×12): qty 50

## 2013-11-11 MED ORDER — VANCOMYCIN HCL IN DEXTROSE 1-5 GM/200ML-% IV SOLN
1000.0000 mg | Freq: Two times a day (BID) | INTRAVENOUS | Status: DC
  Administered 2013-11-12 – 2013-11-13 (×3): 1000 mg via INTRAVENOUS
  Filled 2013-11-11 (×7): qty 200

## 2013-11-11 MED ORDER — PIPERACILLIN-TAZOBACTAM 3.375 G IVPB
INTRAVENOUS | Status: AC
  Filled 2013-11-11: qty 100

## 2013-11-11 NOTE — Progress Notes (Addendum)
[facility] PROGRESS NOTE  KIMBERLEA SCHLAG [id number]:[id number] DOB: 23-Mar-1956 DOA: 11/07/2013 PCP: Maggie Font, MD  Addendum:   CXR: showed pneumonia, patient is febrile, but hemodynamically stable;  -Start IV atx for HCAP, obtain blood c/s; check influenza; empiric tamilu -d/w patient,updated her daughter who informed med that she, and whole family had influenza too while visiting her mother;  Her phone # [phone] Cecille Rubin  [facility] Pager [phone]. If 7PM-7AM, please contact night-coverage at [email or web address], password [facility] 11/11/2013, 5:42 PM  LOS: 4 days

## 2013-11-11 NOTE — Progress Notes (Signed)
[facility] PROGRESS NOTE  Rachel [name] [id number]:[id number] DOB: January 16, 1956 DOA: 11/07/2013 PCP: Maggie Font, MD  Assessment/Plan: 58 y.o. female with PMH of HTN, HPL, anxiety, depression found to have colitis on 11/04/13 was sent home on cipro+flagyl presented with worsening abdominal pain, nausea vomiting   1. Colitis; CT (11/04/13): Diffuse colitis/C difficile; exam: no s/s of acute abdomen  -initial c diff was neg, patient was on atx (cipro/flagyl) 1/11-1/15 -but due to persistent diarrhea, fever (1/15) repeated c diff which came postive (1/15) d/c IV cipro, start PO vanc; may need endoscopy when stable   2. Hypo K likely GI loss; replace prn   3. HTN cont home regimen;   4. Central uterine hypoattenuation seen on CT. Followup with non-urgent pelvic ultrasound as an outpatient.  5. Rash probable atx related (cipro); stop cipro; cont monitoring; no s/s of respiratory compromise   Code Status: full Family Communication: d/w patient  (indicate person spoken with, relationship, and if by phone, the number) Disposition Plan: home 24-48 hours if stable    Consultants:  GI  Procedures:  None;   Antibiotics: Cipro 1/11 >>  Flagyl 1/11 >>    (indicate start date, and stop date if known)  HPI/Subjective: Alert  Objective: Filed Vitals:   11/11/13 0535  BP: 142/51  Pulse: 107  Temp: 101 F (38.3 C)  Resp: 18    Intake/Output Summary (Last 24 hours) at 11/11/13 0843 Last data filed at 11/10/13 1730  Gross per 24 hour  Intake    240 ml  Output      0 ml  Net    240 ml   Filed Weights   11/07/13 0157 11/07/13 0534  Weight: 91.173 kg (201 lb) 110.904 kg (244 lb 8 oz)    Exam:   General:  aletr  Cardiovascular: s1,s2 rrr  Respiratory: CTA BL  Abdomen: soft, nt, nd   Musculoskeletal: no LE edema   Data Reviewed: Basic Metabolic Panel:  Recent Labs Lab 11/07/13 0303 11/08/13 0457 11/09/13 0522 11/10/13 0515  NA 135* 135* 140 139  K 3.1*  3.5* 3.5* 3.9  CL 98 101 105 105  CO2 26 23 26 25   GLUCOSE 123* 134* 121* 106*  BUN 7 5* 3* 3*  CREATININE 0.78 0.81 0.85 0.81  CALCIUM 8.0* 8.0* 8.6 8.1*  MG  --   --  1.8  --    Liver Function Tests:  Recent Labs Lab 11/07/13 0303  AST 17  ALT 16  ALKPHOS 57  BILITOT 0.3  PROT 5.9*  ALBUMIN 2.5*    Recent Labs Lab 11/07/13 0303  LIPASE 23   No results found for this basename: AMMONIA,  in the last 168 hours CBC:  Recent Labs Lab 11/07/13 0303 11/08/13 0457 11/09/13 0522  WBC 14.4* 11.2* 8.3  NEUTROABS 11.5*  --   --   HGB 11.5* 11.4* 12.6  HCT 33.2* 32.3* 36.4  MCV 89.2 90.7 91.9  PLT 208 213 250   Cardiac Enzymes: No results found for this basename: CKTOTAL, CKMB, CKMBINDEX, TROPONINI,  in the last 168 hours BNP (last 3 results) No results found for this basename: PROBNP,  in the last 8760 hours CBG: No results found for this basename: GLUCAP,  in the last 168 hours  Recent Results (from the past 240 hour(s))  CLOSTRIDIUM DIFFICILE BY PCR     Status: None   Collection Time    11/07/13 11:54 AM      Result Value Range Status  C difficile by pcr NEGATIVE  NEGATIVE Final  CLOSTRIDIUM DIFFICILE BY PCR     Status: Abnormal   Collection Time    11/10/13  3:56 PM      Result Value Range Status   C difficile by pcr POSITIVE (*) NEGATIVE Final   Comment: CRITICAL RESULT CALLED TO, READ BACK BY AND VERIFIED WITH:     BULLOCK,T ON 11/10/13 AT 2050 BY LOY,C     Studies: No results found.  Scheduled Meds: . acidophilus  2 capsule Oral Daily  . amLODipine  10 mg Oral q morning - 10a  . ciprofloxacin  400 mg Intravenous Q12H  . citalopram  40 mg Oral q morning - 10a  . cloNIDine  0.1 mg Oral QPM  . enoxaparin (LOVENOX) injection  40 mg Subcutaneous Q24H  . gabapentin  300 mg Oral TID  . metronidazole  500 mg Intravenous Q8H  . simvastatin  10 mg Oral q1800  . sodium chloride  3 mL Intravenous Q12H  . trazodone  100 mg Oral QHS   Continuous  Infusions:   Principal Problem:   Acute colitis Active Problems:   Abdominal pain   ANXIETY   HYPERTENSION   Colitis    Time spent: >35 minutes     Rachel [name]  [facility] Pager [phone]. If 7PM-7AM, please contact night-coverage at [email or web address], password [facility] 11/11/2013, 8:43 AM  LOS: 4 days

## 2013-11-11 NOTE — Progress Notes (Signed)
CRITICAL VALUE ALERT  Critical value received:  C-difficile positive  Date of notification:  11/10/13  Time of notification:  2050  Critical value read back:yes  Nurse who received alert:  Manuela Schwartz, RN  MD notified (1st page):  Chaney Malling, NP  Time of first page:  2100  MD notified (2nd page):  Time of second page:  Responding MD:  Chaney Malling, NP  Time MD responded:  2105  No new orders given at this time.

## 2013-11-11 NOTE — Progress Notes (Signed)
ANTIBIOTIC CONSULT NOTE - INITIAL  Pharmacy Consult for Vancomycin & Zosyn Indication: pneumonia  Allergies  Allergen Reactions  . Lisinopril     REACTION: Cough  . Prednisone     REACTION: sick on the stomach  . Betadine [Povidone Iodine] Rash    Patient Measurements: Height: 5\' 4"  (162.6 cm) Weight: 244 lb 8 oz (110.904 kg) IBW/kg (Calculated) : 54.7 Adjusted Body Weight:   Vital Signs: Temp: 102.3 F (39.1 C) (01/15 1446) Temp src: Oral (01/15 1446) BP: 140/52 mmHg (01/15 1446) Pulse Rate: 121 (01/15 1446) Intake/Output from previous day: 01/14 0701 - 01/15 0700 In: 360 [P.O.:360] Out: -  Intake/Output from this shift:    Labs:  Recent Labs  11/09/13 0522 11/10/13 0515  WBC 8.3  --   HGB 12.6  --   PLT 250  --   CREATININE 0.85 0.81   Estimated Creatinine Clearance: 91.1 ml/min (by C-G formula based on Cr of 0.81). No results found for this basename: VANCOTROUGH, Corlis Leak, VANCORANDOM, [place], GENTPEAK, GENTRANDOM, [place], TOBRAPEAK, TOBRARND, AMIKACINPEAK, AMIKACINTROU, AMIKACIN,  in the last 72 hours   Microbiology: Recent Results (from the past 720 hour(s))  CLOSTRIDIUM DIFFICILE BY PCR     Status: None   Collection Time    11/07/13 11:54 AM      Result Value Range Status   C difficile by pcr NEGATIVE  NEGATIVE Final  CLOSTRIDIUM DIFFICILE BY PCR     Status: Abnormal   Collection Time    11/10/13  3:56 PM      Result Value Range Status   C difficile by pcr POSITIVE (*) NEGATIVE Final   Comment: CRITICAL RESULT CALLED TO, READ BACK BY AND VERIFIED WITH:     BULLOCK,T ON 11/10/13 AT 2050 BY LOY,C    Medical History: Past Medical History  Diagnosis Date  . Rupture of rotator cuff, complete   . Impingement syndrome of left shoulder   . Shoulder pain   . Subluxation of radial head   . Hip pain, right   . Obesity   . Osteoarthritis   . Medial meniscus tear     left   . Hypertension   . Hyperlipidemia   . Depression   . Anxiety      Medications:  Scheduled:  . acidophilus  2 capsule Oral Daily  . amLODipine  10 mg Oral q morning - 10a  . citalopram  40 mg Oral q morning - 10a  . cloNIDine  0.1 mg Oral QPM  . enoxaparin (LOVENOX) injection  40 mg Subcutaneous Q24H  . gabapentin  300 mg Oral TID  . oseltamivir  75 mg Oral BID  . piperacillin-tazobactam (ZOSYN)  IV  3.375 g Intravenous Q8H  . simvastatin  10 mg Oral q1800  . sodium chloride  3 mL Intravenous Q12H  . trazodone  100 mg Oral QHS  . vancomycin  125 mg Oral Q6H  . vancomycin  1,000 mg Intravenous Q1 Hr x 2  . [START ON 11/12/2013] vancomycin  1,000 mg Intravenous Q12H   Assessment: CXR: Pneumonia, HCAP Patient febrile CrCl normalized 85 ml/min   Goal of Therapy:  Vancomycin trough level 15-20 mcg/ml  Plan:  Vancomycin 2 GM IV loading dose, then 1 GM IV every 12 hours Vancomycin trough at steady state Zosyn 3.375 GM IV every 8 hours, infused over 4 hours Labs per protocol  Abner Greenspan, Nayzeth Altman Bennett 11/11/2013,6:24 PM

## 2013-11-11 NOTE — Progress Notes (Signed)
D- Pt complains of wheezing with breathing.  She reports minimal sob with activity.  Lung fields are wheezy through out when auscultated with stethoscope.  Oxygen saturations are 95-97% on room air.  Pt reports productive cough with yellow sputum.  A- Chaney Malling, NP notified of the above and new orders were given for respiratory treatment.  Orders carried out by respiratory therapist.  R- Pt in bed and reports she feels better after the respiratory treatment.  Breath sounds are diminished at this time and oxygen saturations are 95% on room air.  Pt instructed to call if symptoms return or worsen.  Nursing staff to continue to monitor.

## 2013-11-12 ENCOUNTER — Encounter (HOSPITAL_COMMUNITY): Payer: Self-pay | Admitting: Gastroenterology

## 2013-11-12 DIAGNOSIS — J189 Pneumonia, unspecified organism: Secondary | ICD-10-CM

## 2013-11-12 DIAGNOSIS — J09X1 Influenza due to identified novel influenza A virus with pneumonia: Secondary | ICD-10-CM

## 2013-11-12 DIAGNOSIS — A0472 Enterocolitis due to Clostridium difficile, not specified as recurrent: Secondary | ICD-10-CM

## 2013-11-12 LAB — CBC
HEMATOCRIT: 31.5 % — AB (ref 36.0–46.0)
HEMOGLOBIN: 10.9 g/dL — AB (ref 12.0–15.0)
MCH: 31.6 pg (ref 26.0–34.0)
MCHC: 34.6 g/dL (ref 30.0–36.0)
MCV: 91.3 fL (ref 78.0–100.0)
Platelets: 235 10*3/uL (ref 150–400)
RBC: 3.45 MIL/uL — AB (ref 3.87–5.11)
RDW: 14.2 % (ref 11.5–15.5)
WBC: 6.9 10*3/uL (ref 4.0–10.5)

## 2013-11-12 LAB — BASIC METABOLIC PANEL
BUN: 5 mg/dL — AB (ref 6–23)
CHLORIDE: 97 meq/L (ref 96–112)
CO2: 28 meq/L (ref 19–32)
Calcium: 8 mg/dL — ABNORMAL LOW (ref 8.4–10.5)
Creatinine, Ser: 0.89 mg/dL (ref 0.50–1.10)
GFR calc Af Amer: 81 mL/min — ABNORMAL LOW (ref >90)
GFR, EST NON AFRICAN AMERICAN: 70 mL/min — AB (ref >90)
Glucose, Bld: 120 mg/dL — ABNORMAL HIGH (ref 70–99)
POTASSIUM: 3.8 meq/L (ref 3.7–5.3)
SODIUM: 135 meq/L — AB (ref 137–147)

## 2013-11-12 MED ORDER — METHYLPREDNISOLONE SODIUM SUCC 40 MG IJ SOLR
40.0000 mg | Freq: Every day | INTRAMUSCULAR | Status: DC
  Administered 2013-11-12: 40 mg via INTRAVENOUS
  Filled 2013-11-12: qty 1

## 2013-11-12 MED ORDER — VANCOMYCIN HCL 125 MG PO CAPS
125.0000 mg | ORAL_CAPSULE | Freq: Four times a day (QID) | ORAL | Status: DC
Start: 2013-11-12 — End: 2013-11-14
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
## 2013-11-12 NOTE — Progress Notes (Signed)
Notified Dr. Daleen Bo of the patient having a worsening rash, generalized malaise, fever 99 and chills.  MD states these may be resulting from the flu and pneumonia dx.  He stated that he would order her solumedrol since the patient is currently already on benedryl.

## 2013-11-12 NOTE — Progress Notes (Addendum)
[facility] PROGRESS NOTE  Rachel [name] AYT:[id number] DOB: [birth year] DOA: 11/07/2013 PCP: Maggie Font, MD  Assessment/Plan: 58 y.o. female with PMH of HTN, HPL, anxiety, depression found to have colitis on 11/04/13 was sent home on cipro+flagyl presented with worsening abdominal pain, nausea vomiting   1. Colitis; CT (11/04/13): Diffuse colitis/C difficile; exam: no s/s of acute abdomen  -initial c diff was neg, patient was on atx (cipro/flagyl) 1/11-1/15 for possible infectious diarrhea  -but due to persistent diarrhea, fever (1/15) repeated c diff which came postive for C diff (1/15) startws PO vanc; may need endoscopy when stable   2. HCAP; CXR: new R side infiltrate, + influenza 1/15 (famiyl had flu) -febrile; started on IV atx (vanc, zosyn), bronchodilators, blood c/s NTD; tamiflu   3. Hypo K likely GI loss; replace prn   4. HTN cont home regimen;   5. Central uterine hypoattenuation seen on CT. Followup with non-urgent pelvic ultrasound as an outpatient.  6. Rash probable atx related (cipro); stop cipro; cont monitoring; no s/s of respiratory compromise  -started atx for active pneumonia, patient need atx due to fever and active infection; start steroids to suppress rash   Code Status: full Family Communication: d/w patient d/w patient,updated her daughter who informed med that she, and whole family had influenza too while visiting her mother;  Her phone # [phone] [name]   (indicate person spoken with, relationship, and if by phone, the number) Disposition Plan: home 24-48 hours if stable    Consultants:  GI  Procedures:  None;   Antibiotics: Cipro 1/11 >>  Flagyl 1/11 >>    (indicate start date, and stop date if known)  HPI/Subjective: Alert  Objective: Filed Vitals:   11/12/13 0402  BP: 125/48  Pulse: 111  Temp: 100.7 F (38.2 C)  Resp: 18    Intake/Output Summary (Last 24 hours) at 11/12/13 0758 Last data filed at 11/11/13 1800  Gross  per 24 hour  Intake    600 ml  Output      0 ml  Net    600 ml   Filed Weights   11/07/13 0157 11/07/13 0534  Weight: 91.173 kg (201 lb) 110.904 kg (244 lb 8 oz)    Exam:   General:  aletr  Cardiovascular: s1,s2 rrr  Respiratory: CTA BL  Abdomen: soft, nt, nd   Musculoskeletal: no LE edema   Data Reviewed: Basic Metabolic Panel:  Recent Labs Lab 11/07/13 0303 11/08/13 0457 11/09/13 0522 11/10/13 0515 11/12/13 0600  NA 135* 135* 140 139 135*  K 3.1* 3.5* 3.5* 3.9 3.8  CL 98 101 105 105 97  CO2 26 23 26 25 28   GLUCOSE 123* 134* 121* 106* 120*  BUN 7 5* 3* 3* 5*  CREATININE 0.78 0.81 0.85 0.81 0.89  CALCIUM 8.0* 8.0* 8.6 8.1* 8.0*  MG  --   --  1.8  --   --    Liver Function Tests:  Recent Labs Lab 11/07/13 0303  AST 17  ALT 16  ALKPHOS 57  BILITOT 0.3  PROT 5.9*  ALBUMIN 2.5*    Recent Labs Lab 11/07/13 0303  LIPASE 23   No results found for this basename: AMMONIA,  in the last 168 hours CBC:  Recent Labs Lab 11/07/13 0303 11/08/13 0457 11/09/13 0522 11/12/13 0600  WBC 14.4* 11.2* 8.3 6.9  NEUTROABS 11.5*  --   --   --   HGB 11.5* 11.4* 12.6 10.9*  HCT 33.2* 32.3* 36.4 31.5*  MCV  89.2 90.7 91.9 91.3  PLT 208 213 250 235   Cardiac Enzymes: No results found for this basename: CKTOTAL, CKMB, CKMBINDEX, TROPONINI,  in the last 168 hours BNP (last 3 results) No results found for this basename: PROBNP,  in the last 8760 hours CBG: No results found for this basename: GLUCAP,  in the last 168 hours  Recent Results (from the past 240 hour(s))  CLOSTRIDIUM DIFFICILE BY PCR     Status: None   Collection Time    11/07/13 11:54 AM      Result Value Range Status   C difficile by pcr NEGATIVE  NEGATIVE Final  CLOSTRIDIUM DIFFICILE BY PCR     Status: Abnormal   Collection Time    11/10/13  3:56 PM      Result Value Range Status   C difficile by pcr POSITIVE (*) NEGATIVE Final   Comment: CRITICAL RESULT CALLED TO, READ BACK BY AND VERIFIED  WITH:     BULLOCK,T ON 11/10/13 AT 2050 BY LOY,C  CULTURE, BLOOD (ROUTINE X 2)     Status: None   Collection Time    11/11/13  5:55 PM      Result Value Range Status   Specimen Description BLOOD RIGHT ANTECUBITAL   Final   Special Requests     Final   Value: BOTTLES DRAWN AEROBIC AND ANAEROBIC AEB=12CC ANA=15CC   Culture NO GROWTH 1 DAY   Final   Report Status PENDING   Incomplete  CULTURE, BLOOD (ROUTINE X 2)     Status: None   Collection Time    11/11/13  5:55 PM      Result Value Range Status   Specimen Description BLOOD RIGHT HAND   Final   Special Requests BOTTLES DRAWN AEROBIC AND ANAEROBIC [place]   Final   Culture NO GROWTH 1 DAY   Final   Report Status PENDING   Incomplete     Studies: Dg Chest 2 View  11/11/2013   CLINICAL DATA:  Shortness of breath, hypertension, wheezing, and cough for 2 days  EXAM: CHEST  2 VIEW  COMPARISON:  09/16/2013  Correlation:  CT abdomen and pelvis 11/04/2013  FINDINGS: Mild enlargement of cardiac silhouette.  Mediastinal contours and pulmonary vascularity normal.  New infiltrate identified in the lateral segment of the right middle lobe consistent with pneumonia.  Question minimal right lower lobe infiltrate as well.  Remaining lungs clear.  No pleural effusion or pneumothorax.  Metallic foreign body question bb within left breast.  Prior cervical spine fusion.  No acute osseous findings.  IMPRESSION: New right middle lobe pneumonia.  Enlargement of cardiac silhouette.   Electronically Signed   By: Lavonia Dana M.D.   On: 11/11/2013 16:37    Scheduled Meds: . acidophilus  2 capsule Oral Daily  . amLODipine  10 mg Oral q morning - 10a  . citalopram  40 mg Oral q morning - 10a  . cloNIDine  0.1 mg Oral QPM  . enoxaparin (LOVENOX) injection  40 mg Subcutaneous Q24H  . gabapentin  300 mg Oral TID  . oseltamivir  75 mg Oral BID  . piperacillin-tazobactam (ZOSYN)  IV  3.375 g Intravenous Q8H  . simvastatin  10 mg Oral q1800  . sodium chloride  3 mL  Intravenous Q12H  . trazodone  100 mg Oral QHS  . vancomycin  125 mg Oral Q6H  . vancomycin  1,000 mg Intravenous Q12H   Continuous Infusions:   Principal Problem:   Acute colitis  Active Problems:   Abdominal pain   ANXIETY   HYPERTENSION   Colitis    Time spent: >35 minutes     [name]  [facility] Pager [phone]. If 7PM-7AM, please contact night-coverage at [email or web address], password [facility] 11/12/2013, 7:58 AM  LOS: 5 days

## 2013-11-13 DIAGNOSIS — A0472 Enterocolitis due to Clostridium difficile, not specified as recurrent: Secondary | ICD-10-CM

## 2013-11-13 NOTE — Progress Notes (Addendum)
[facility] PROGRESS NOTE  Rachel [name] ZOX:[id number] DOB: 25-May-1956 DOA: 11/07/2013 PCP: Maggie Font, MD  Assessment/Plan: 58 y.o. female with PMH of HTN, HPL, anxiety, depression found to have colitis on 11/04/13 was sent home on cipro+flagyl presented with worsening abdominal pain, nausea vomiting   1. Colitis; CT (11/04/13): Diffuse colitis/C difficile; exam: no s/s of acute abdomen  -initial c diff was neg, patient was on atx (cipro/flagyl) 1/11-1/15 for possible infectious diarrhea  -but due to persistent diarrhea, fever (1/15) repeated c diff which came postive for C diff (1/15) startws PO vanc; may need endoscopy when stable, appreciate GI eval   2. HCAP; CXR: new R side infiltrate, + influenza 1/15 (famiyl had flu) -afebrile 1/16 night; d/c atx zosyn (1/11-1/17) cont  bronchodilators, blood c/s NTD; tamiflu   3. Hypo K likely GI loss; replace prn   4. HTN cont home regimen;   5. Central uterine hypoattenuation seen on CT. Followup with non-urgent pelvic ultrasound as an outpatient.  6. Rash probable atx related (cipro); stop cipro; cont monitoring; no s/s of respiratory compromise  -d/c zosyn   Code Status: full Family Communication: d/w patient d/w patient,updated her daughter who informed med that she, and whole family had influenza too while visiting her mother;  Her phone # [phone] [name]: updated;   (indicate person spoken with, relationship, and if by phone, the number) Disposition Plan: home 24-48 hours if stable    Consultants:  GI  Procedures:  None;   Antibiotics: Cipro 1/11 >>  Flagyl 1/11 >>    (indicate start date, and stop date if known)  HPI/Subjective: Alert  Objective: Filed Vitals:   11/13/13 0636  BP: 132/58  Pulse: 71  Temp: 98.2 F (36.8 C)  Resp: 20    Intake/Output Summary (Last 24 hours) at 11/13/13 0834 Last data filed at 11/12/13 1900  Gross per 24 hour  Intake   1120 ml  Output      0 ml  Net   1120 ml    Filed Weights   11/07/13 0157 11/07/13 0534  Weight: 91.173 kg (201 lb) 110.904 kg (244 lb 8 oz)    Exam:   General:  aletr  Cardiovascular: s1,s2 rrr  Respiratory: CTA BL  Abdomen: soft, nt, nd   Musculoskeletal: no LE edema   Data Reviewed: Basic Metabolic Panel:  Recent Labs Lab 11/07/13 0303 11/08/13 0457 11/09/13 0522 11/10/13 0515 11/12/13 0600  NA 135* 135* 140 139 135*  K 3.1* 3.5* 3.5* 3.9 3.8  CL 98 101 105 105 97  CO2 26 23 26 25 28   GLUCOSE 123* 134* 121* 106* 120*  BUN 7 5* 3* 3* 5*  CREATININE 0.78 0.81 0.85 0.81 0.89  CALCIUM 8.0* 8.0* 8.6 8.1* 8.0*  MG  --   --  1.8  --   --    Liver Function Tests:  Recent Labs Lab 11/07/13 0303  AST 17  ALT 16  ALKPHOS 57  BILITOT 0.3  PROT 5.9*  ALBUMIN 2.5*    Recent Labs Lab 11/07/13 0303  LIPASE 23   No results found for this basename: AMMONIA,  in the last 168 hours CBC:  Recent Labs Lab 11/07/13 0303 11/08/13 0457 11/09/13 0522 11/12/13 0600  WBC 14.4* 11.2* 8.3 6.9  NEUTROABS 11.5*  --   --   --   HGB 11.5* 11.4* 12.6 10.9*  HCT 33.2* 32.3* 36.4 31.5*  MCV 89.2 90.7 91.9 91.3  PLT 208 213 250 235   Cardiac  Enzymes: No results found for this basename: CKTOTAL, CKMB, CKMBINDEX, TROPONINI,  in the last 168 hours BNP (last 3 results) No results found for this basename: PROBNP,  in the last 8760 hours CBG: No results found for this basename: GLUCAP,  in the last 168 hours  Recent Results (from the past 240 hour(s))  CLOSTRIDIUM DIFFICILE BY PCR     Status: None   Collection Time    11/07/13 11:54 AM      Result Value Range Status   C difficile by pcr NEGATIVE  NEGATIVE Final  CLOSTRIDIUM DIFFICILE BY PCR     Status: Abnormal   Collection Time    11/10/13  3:56 PM      Result Value Range Status   C difficile by pcr POSITIVE (*) NEGATIVE Final   Comment: CRITICAL RESULT CALLED TO, READ BACK BY AND VERIFIED WITH:     BULLOCK,T ON 11/10/13 AT 2050 BY LOY,C  CULTURE, BLOOD  (ROUTINE X 2)     Status: None   Collection Time    11/11/13  5:55 PM      Result Value Range Status   Specimen Description BLOOD RIGHT ANTECUBITAL   Final   Special Requests     Final   Value: BOTTLES DRAWN AEROBIC AND ANAEROBIC AEB=12CC ANA=15CC   Culture NO GROWTH 2 DAYS   Final   Report Status PENDING   Incomplete  CULTURE, BLOOD (ROUTINE X 2)     Status: None   Collection Time    11/11/13  5:55 PM      Result Value Range Status   Specimen Description BLOOD RIGHT HAND   Final   Special Requests BOTTLES DRAWN AEROBIC AND ANAEROBIC [place]   Final   Culture NO GROWTH 2 DAYS   Final   Report Status PENDING   Incomplete     Studies: Dg Chest 2 View  11/11/2013   CLINICAL DATA:  Shortness of breath, hypertension, wheezing, and cough for 2 days  EXAM: CHEST  2 VIEW  COMPARISON:  09/16/2013  Correlation:  CT abdomen and pelvis 11/04/2013  FINDINGS: Mild enlargement of cardiac silhouette.  Mediastinal contours and pulmonary vascularity normal.  New infiltrate identified in the lateral segment of the right middle lobe consistent with pneumonia.  Question minimal right lower lobe infiltrate as well.  Remaining lungs clear.  No pleural effusion or pneumothorax.  Metallic foreign body question bb within left breast.  Prior cervical spine fusion.  No acute osseous findings.  IMPRESSION: New right middle lobe pneumonia.  Enlargement of cardiac silhouette.   Electronically Signed   By: Lavonia Dana M.D.   On: 11/11/2013 16:37    Scheduled Meds: . acidophilus  2 capsule Oral Daily  . amLODipine  10 mg Oral q morning - 10a  . citalopram  40 mg Oral q morning - 10a  . cloNIDine  0.1 mg Oral QPM  . enoxaparin (LOVENOX) injection  40 mg Subcutaneous Q24H  . gabapentin  300 mg Oral TID  . methylPREDNISolone (SOLU-MEDROL) injection  40 mg Intravenous Daily  . oseltamivir  75 mg Oral BID  . piperacillin-tazobactam (ZOSYN)  IV  3.375 g Intravenous Q8H  . simvastatin  10 mg Oral q1800  . sodium chloride  3  mL Intravenous Q12H  . trazodone  100 mg Oral QHS  . vancomycin  125 mg Oral Q6H  . vancomycin  1,000 mg Intravenous Q12H   Continuous Infusions:   Principal Problem:   Acute colitis Active Problems:  Abdominal pain   ANXIETY   HYPERTENSION   Colitis    Time spent: >35 minutes     [name]  [facility] Pager [phone]. If 7PM-7AM, please contact night-coverage at [email or web address], password [facility] 11/13/2013, 8:33 AM  LOS: 6 days

## 2013-11-13 NOTE — Progress Notes (Signed)
Subjective; Patient states she feels better. Her appetite is returning. She denies shortness of breath or cough. She had one stool during the night and one this morning. Stool is now mushy. She denies rectal bleeding or melena. She says her abdominal pain has decreased.  Objective; BP 132/58  Pulse 71  Temp(Src) 98.2 F (36.8 C) (Oral)  Resp 20  Ht 5\' 4"  (1.626 m)  Wt 244 lb 8 oz (110.904 kg)  BMI 41.95 kg/m2  SpO2 96% Abdomen is full. Bowel sounds are normal. Abdomen is soft with mild generalized tenderness. No organomegaly or masses. She has trace edema around her ankles.  Lab data; WBC 6.9, H&H 10.9 and 31.5 and platelet count 235K Serum sodium 135, potassium 3.8, right 97, CO2 28, BUN 5, creatinine 0.89 Glucose 120 Serum calcium 8.0  Assessment; #1. C. difficile colitis. Finishing day 2 on by mouth vancomycin she is doing much better. #2. Pneumonia. Patient currently on IV vancomycin. #3. Anemia. Anemia appears to be secondary to acute illness. She is up-to-date on screening for CRC. Last colonoscopy was in October 2011 revealing a 4 mm tubular adenoma and internal hemorrhoids.  Recommendations; Continue by mouth vancomycin for for total of 2 weeks. If she goes home tomorrow she will need another 10 days. Continue probiotic for one month.  Will sign off. Thanks

## 2013-11-13 NOTE — Progress Notes (Signed)
Notified Dr. Daleen Bo of the patients complaint of her IV burning and hurting.  IV removed by NT.  MD stated it was okay to leave iv out.

## 2013-11-14 DIAGNOSIS — R21 Rash and other nonspecific skin eruption: Secondary | ICD-10-CM

## 2013-11-14 LAB — CREATININE, SERUM
Creatinine, Ser: 1.15 mg/dL — ABNORMAL HIGH (ref 0.50–1.10)
GFR calc Af Amer: 59 mL/min — ABNORMAL LOW (ref >90)
GFR calc non Af Amer: 51 mL/min — ABNORMAL LOW (ref >90)

## 2013-11-14 MED ORDER — RISAQUAD PO CAPS: 2.0000 | ORAL_CAPSULE | Freq: Every day | ORAL | Status: DC

## 2013-11-14 MED ORDER — OSELTAMIVIR PHOSPHATE 75 MG PO CAPS: 75.0000 mg | ORAL_CAPSULE | Freq: Two times a day (BID) | ORAL | Status: DC

## 2013-11-14 MED ORDER — VANCOMYCIN HCL 125 MG PO CAPS: 125.0000 mg | ORAL_CAPSULE | Freq: Four times a day (QID) | ORAL | Status: DC

## 2013-11-14 NOTE — Discharge Summary (Signed)
Physician Discharge Summary  Rachel [name] YWV:[id number] DOB: 1956-07-20 DOA: 11/07/2013  PCP: Maggie Font, MD  Admit date: 11/07/2013 Discharge date: 11/14/2013  Time spent: >35 minutes  Recommendations for Outpatient Follow-up:  F/u with GI in 4-6 week F/u with PCP in 1-2 weeks  Discharge Diagnoses:  Principal Problem:   Acute colitis Active Problems:   Abdominal pain   ANXIETY   HYPERTENSION   Colitis   Discharge Condition: stable   Diet recommendation: heart healthy   Filed Weights   11/07/13 0157 11/07/13 0534  Weight: 91.173 kg (201 lb) 110.904 kg (244 lb 8 oz)    History of present illness:  58 y.o. female with PMH of HTN, HPL, anxiety, depression found to have colitis on 11/04/13 was sent home on cipro+flagyl presented with worsening abdominal pain, nausea vomiting found to have c diff; +influenza, HCAP  Hospital Course:  1. Colitis; CT (11/04/13): Diffuse colitis/C difficile; exam: no s/s of acute abdomen  -initial c diff was neg, patient was on atx (cipro/flagyl) 1/11-1/15 for possible infectious diarrhea  -but due to persistent diarrhea, fever (1/15) repeated c diff which came postive for C diff (1/15) started PO vanc;  -clinically improved,m diarrhea resolving; may need endoscopy when stable, appreciate GI eval  2. HCAP; CXR: new R side infiltrate, + influenza 1/15 (famiyl had flu)  -afebrile since 1/16 night; completed atx (1/11-1/17) blood c/s NTD; tamiflu  3. Hypo K likely GI loss; replaced   4. HTN cont home regimen;  5. Central uterine hypoattenuation seen on CT. Followup with non-urgent pelvic ultrasound as an outpatient.  6. Rash probable atx related (cipro); stop cipro; cont monitoring; no s/s of respiratory compromise  -d/c zosyn; rash improved       Procedures:none  (i.e. Studies not automatically included, echos, thoracentesis, etc; not x-rays)  Consultations:  GI  Discharge Exam: Filed Vitals:   11/14/13 0514  BP: 127/44  Pulse: 69   Temp: 98.4 F (36.9 C)  Resp: 20    General: alert Cardiovascular: s1,s2 rrr Respiratory: CTA BL  Discharge Instructions  Discharge Orders   Future Orders Complete By Expires   Diet - low sodium heart healthy  As directed    Increase activity slowly  As directed        Medication List    STOP taking these medications       ibuprofen 600 MG tablet  Commonly known as:  ADVIL,MOTRIN     metroNIDAZOLE 500 MG tablet  Commonly known as:  FLAGYL      TAKE these medications       acidophilus Caps capsule  Take 2 capsules by mouth daily.     ALPRAZolam 1 MG tablet  Commonly known as:  XANAX  Take 1 mg by mouth 3 (three) times daily as needed for sleep or anxiety.     amLODipine 10 MG tablet  Commonly known as:  NORVASC  Take 10 mg by mouth every morning.     citalopram 40 MG tablet  Commonly known as:  CELEXA  Take 40 mg by mouth every morning.     cloNIDine 0.1 MG tablet  Commonly known as:  CATAPRES  Take 0.1 mg by mouth every evening.     gabapentin 300 MG capsule  Commonly known as:  NEURONTIN  Take 1 capsule (300 mg total) by mouth 3 (three) times daily.     HYDROcodone-acetaminophen 10-325 MG per tablet  Commonly known as:  NORCO  Take 1 tablet by mouth every 4 (  four) hours as needed. pain     ondansetron 4 MG tablet  Commonly known as:  ZOFRAN  Take 1 tablet (4 mg total) by mouth every 6 (six) hours as needed for nausea or vomiting.     oseltamivir 75 MG capsule  Commonly known as:  TAMIFLU  Take 1 capsule (75 mg total) by mouth 2 (two) times daily.     oxyCODONE-acetaminophen 5-325 MG per tablet  Commonly known as:  PERCOCET/ROXICET  Take 1 tablet by mouth every 4 (four) hours as needed for severe pain.     pravastatin 20 MG tablet  Commonly known as:  PRAVACHOL  Take 20 mg by mouth every evening. Recheck lipids with new MD in 3 months.     trazodone 300 MG tablet  Commonly known as:  DESYREL  Take 300 mg by mouth at bedtime.      vancomycin 125 MG capsule  Commonly known as:  VANCOCIN HCL  Take 1 capsule (125 mg total) by mouth 4 (four) times daily.       Allergies  Allergen Reactions  . Lisinopril     REACTION: Cough  . Prednisone     REACTION: sick on the stomach  . Betadine [Povidone Iodine] Rash       Follow-up Information   Follow up with HILL,GERALD K, MD In 1 week.   Specialty:  Family Medicine   Contact information:   [address] [place] [address] [phone]        The results of significant diagnostics from this hospitalization (including imaging, microbiology, ancillary and laboratory) are listed below for reference.    Significant Diagnostic Studies: Dg Chest 2 View  11/11/2013   CLINICAL DATA:  Shortness of breath, hypertension, wheezing, and cough for 2 days  EXAM: CHEST  2 VIEW  COMPARISON:  09/16/2013  Correlation:  CT abdomen and pelvis 11/04/2013  FINDINGS: Mild enlargement of cardiac silhouette.  Mediastinal contours and pulmonary vascularity normal.  New infiltrate identified in the lateral segment of the right middle lobe consistent with pneumonia.  Question minimal right lower lobe infiltrate as well.  Remaining lungs clear.  No pleural effusion or pneumothorax.  Metallic foreign body question bb within left breast.  Prior cervical spine fusion.  No acute osseous findings.  IMPRESSION: New right middle lobe pneumonia.  Enlargement of cardiac silhouette.   Electronically Signed   By: Lavonia Dana M.D.   On: 11/11/2013 16:37   Dg Abd 1 View  11/07/2013   CLINICAL DATA:  Abdominal pain, diarrhea and weakness.  EXAM: ABDOMEN - 1 VIEW  COMPARISON:  None.  FINDINGS: No evidence of bowel obstruction or significant ileus. No abnormal calcifications are visualized. Degenerative changes are present in the lumbar spine.  IMPRESSION: No evidence of bowel obstruction or ileus.   Electronically Signed   By: Aletta Edouard M.D.   On: 11/07/2013 12:22   Ct Abdomen Pelvis W  Contrast  11/04/2013   CLINICAL DATA:  Generalized abdominal pain. Nausea vomiting and diarrhea for several days. Hypertension. Obesity.  EXAM: CT ABDOMEN AND PELVIS WITH CONTRAST  TECHNIQUE: Multidetector CT imaging of the abdomen and pelvis was performed using the standard protocol following bolus administration of intravenous contrast.  CONTRAST:  17mL OMNIPAQUE IOHEXOL 300 MG/ML SOLN, 173mL OMNIPAQUE IOHEXOL 300 MG/ML SOLN  COMPARISON:  CT ABDOMEN W/O CM dated 07/20/2009; CT L SPINE W/O CM dated 01/12/2009  FINDINGS: Lower Chest: Clear lung bases. Mild cardiomegaly with trace pericardial fluid or thickening.  No pleural fluid.  Abdomen/Pelvis: Normal liver, spleen. Tiny hiatal hernia. Normal pancreas, gallbladder, biliary tract, adrenal glands, kidneys.  Aortic atherosclerosis. No retroperitoneal or retrocrural adenopathy. Relatively diffuse moderate to marked colonic wall thickening and surrounding edema. Normal terminal ileum. Normal appendix. Normal small bowel without abdominal ascites. No pneumatosis or free intraperitoneal air.  Beam hardening artifact from pelvic hardware degrades evaluation of the pelvis. No pelvic adenopathy. Normal urinary bladder. No adnexal mass. Central hypoattenuation within the uterus. Example image 21/series 7. No gross free pelvic fluid.  Bones/Musculoskeletal: Right hip arthroplasty and proximal left femoral fixation. Underlying left hip osteoarthritis. Right iliac bone harvest site. Spondylosis with prominent disc bulges at L4-5 and L2-3.  IMPRESSION: 1. Diffuse colitis. Favor infection. Clinically exclude C difficile. Given distribution, ulcerative colitis is possible but felt less likely. 2. Degraded evaluation of the pelvis secondary to beam hardening artifact from prior surgery. 3. Cardiomegaly with trace pericardial fluid or thickening. 4. Central uterine hypoattenuation. If this is a 58 year old patient is postmenopausal, this is abnormal and warrants further evaluation  with nonemergent pelvic ultrasound.   Electronically Signed   By: Jeronimo Greaves M.D.   On: 11/04/2013 07:28    Microbiology: Recent Results (from the past 240 hour(s))  CLOSTRIDIUM DIFFICILE BY PCR     Status: None   Collection Time    11/07/13 11:54 AM      Result Value Range Status   C difficile by pcr NEGATIVE  NEGATIVE Final  CLOSTRIDIUM DIFFICILE BY PCR     Status: Abnormal   Collection Time    11/10/13  3:56 PM      Result Value Range Status   C difficile by pcr POSITIVE (*) NEGATIVE Final   Comment: CRITICAL RESULT CALLED TO, READ BACK BY AND VERIFIED WITH:     BULLOCK,T ON 11/10/13 AT 2050 BY LOY,C  CULTURE, BLOOD (ROUTINE X 2)     Status: None   Collection Time    11/11/13  5:55 PM      Result Value Range Status   Specimen Description BLOOD RIGHT ANTECUBITAL   Final   Special Requests     Final   Value: BOTTLES DRAWN AEROBIC AND ANAEROBIC AEB=12CC ANA=15CC   Culture NO GROWTH 2 DAYS   Final   Report Status PENDING   Incomplete  CULTURE, BLOOD (ROUTINE X 2)     Status: None   Collection Time    11/11/13  5:55 PM      Result Value Range Status   Specimen Description BLOOD RIGHT HAND   Final   Special Requests BOTTLES DRAWN AEROBIC AND ANAEROBIC 16CC   Final   Culture NO GROWTH 2 DAYS   Final   Report Status PENDING   Incomplete     Labs: Basic Metabolic Panel:  Recent Labs Lab 11/08/13 0457 11/09/13 0522 11/10/13 0515 11/12/13 0600 11/14/13 0559  NA 135* 140 139 135*  --   K 3.5* 3.5* 3.9 3.8  --   CL 101 105 105 97  --   CO2 23 26 25 28   --   GLUCOSE 134* 121* 106* 120*  --   BUN 5* 3* 3* 5*  --   CREATININE 0.81 0.85 0.81 0.89 1.15*  CALCIUM 8.0* 8.6 8.1* 8.0*  --   MG  --  1.8  --   --   --    Liver Function Tests: No results found for this basename: AST, ALT, ALKPHOS, BILITOT, PROT, ALBUMIN,  in the last 168 hours No  results found for this basename: LIPASE, AMYLASE,  in the last 168 hours No results found for this basename: AMMONIA,  in the last 168  hours CBC:  Recent Labs Lab 11/08/13 0457 11/09/13 0522 11/12/13 0600  WBC 11.2* 8.3 6.9  HGB 11.4* 12.6 10.9*  HCT 32.3* 36.4 31.5*  MCV 90.7 91.9 91.3  PLT 213 250 235   Cardiac Enzymes: No results found for this basename: CKTOTAL, CKMB, CKMBINDEX, TROPONINI,  in the last 168 hours BNP: BNP (last 3 results) No results found for this basename: PROBNP,  in the last 8760 hours CBG: No results found for this basename: GLUCAP,  in the last 168 hours     Signed:  [name]  [facility] 11/14/2013, 8:24 AM

## 2013-11-14 NOTE — Progress Notes (Signed)
Notified the listed daughter per patient request for to arrange transportation home since she has been discharged.  No answer message left for the daughter to call me back.

## 2013-11-16 LAB — CULTURE, BLOOD (ROUTINE X 2)
Culture: NO GROWTH
Culture: NO GROWTH

## 2013-11-16 NOTE — Progress Notes (Signed)
UR chart review completed.  

## 2013-12-11 ENCOUNTER — Encounter (HOSPITAL_COMMUNITY): Payer: Self-pay | Admitting: Emergency Medicine

## 2013-12-11 ENCOUNTER — Inpatient Hospital Stay (HOSPITAL_COMMUNITY)
Admission: EM | Admit: 2013-12-11 | Discharge: 2013-12-16 | DRG: 392 | Disposition: A | Discharge status: Home / Self Care | Payer: Medicaid Other | Attending: Internal Medicine | Admitting: Internal Medicine

## 2013-12-11 ENCOUNTER — Emergency Department (HOSPITAL_COMMUNITY): Payer: Medicaid Other

## 2013-12-11 DIAGNOSIS — E785 Hyperlipidemia, unspecified: Secondary | ICD-10-CM | POA: Diagnosis present

## 2013-12-11 DIAGNOSIS — E441 Mild protein-calorie malnutrition: Secondary | ICD-10-CM | POA: Diagnosis present

## 2013-12-11 DIAGNOSIS — R109 Unspecified abdominal pain: Secondary | ICD-10-CM

## 2013-12-11 DIAGNOSIS — K648 Other hemorrhoids: Secondary | ICD-10-CM | POA: Diagnosis present

## 2013-12-11 DIAGNOSIS — Z6839 Body mass index (BMI) 39.0-39.9, adult: Secondary | ICD-10-CM

## 2013-12-11 DIAGNOSIS — F3289 Other specified depressive episodes: Secondary | ICD-10-CM | POA: Diagnosis present

## 2013-12-11 DIAGNOSIS — I10 Essential (primary) hypertension: Secondary | ICD-10-CM | POA: Diagnosis present

## 2013-12-11 DIAGNOSIS — E860 Dehydration: Secondary | ICD-10-CM | POA: Diagnosis present

## 2013-12-11 DIAGNOSIS — A0472 Enterocolitis due to Clostridium difficile, not specified as recurrent: Secondary | ICD-10-CM

## 2013-12-11 DIAGNOSIS — M1990 Unspecified osteoarthritis, unspecified site: Secondary | ICD-10-CM | POA: Diagnosis present

## 2013-12-11 DIAGNOSIS — K644 Residual hemorrhoidal skin tags: Secondary | ICD-10-CM | POA: Diagnosis present

## 2013-12-11 DIAGNOSIS — K529 Noninfective gastroenteritis and colitis, unspecified: Secondary | ICD-10-CM

## 2013-12-11 DIAGNOSIS — F411 Generalized anxiety disorder: Secondary | ICD-10-CM | POA: Diagnosis present

## 2013-12-11 DIAGNOSIS — E669 Obesity, unspecified: Secondary | ICD-10-CM | POA: Diagnosis present

## 2013-12-11 DIAGNOSIS — Z79899 Other long term (current) drug therapy: Secondary | ICD-10-CM

## 2013-12-11 DIAGNOSIS — F329 Major depressive disorder, single episode, unspecified: Secondary | ICD-10-CM | POA: Diagnosis present

## 2013-12-11 DIAGNOSIS — E876 Hypokalemia: Secondary | ICD-10-CM | POA: Diagnosis present

## 2013-12-11 DIAGNOSIS — K5289 Other specified noninfective gastroenteritis and colitis: Principal | ICD-10-CM | POA: Diagnosis present

## 2013-12-11 LAB — COMPREHENSIVE METABOLIC PANEL
ALK PHOS: 55 U/L (ref 39–117)
ALT: 11 U/L (ref 0–35)
AST: 18 U/L (ref 0–37)
Albumin: 3.7 g/dL (ref 3.5–5.2)
BUN: 7 mg/dL (ref 6–23)
CALCIUM: 9.5 mg/dL (ref 8.4–10.5)
CO2: 22 meq/L (ref 19–32)
Chloride: 99 mEq/L (ref 96–112)
Creatinine, Ser: 0.83 mg/dL (ref 0.50–1.10)
GFR, EST AFRICAN AMERICAN: 88 mL/min — AB (ref >90)
GFR, EST NON AFRICAN AMERICAN: 76 mL/min — AB (ref >90)
GLUCOSE: 126 mg/dL — AB (ref 70–99)
Potassium: 3.1 mEq/L — ABNORMAL LOW (ref 3.7–5.3)
Sodium: 138 mEq/L (ref 137–147)
Total Bilirubin: 0.4 mg/dL (ref 0.3–1.2)
Total Protein: 7.3 g/dL (ref 6.0–8.3)

## 2013-12-11 LAB — CBC
HEMATOCRIT: 34 % — AB (ref 36.0–46.0)
HEMOGLOBIN: 11.6 g/dL — AB (ref 12.0–15.0)
MCH: 30.2 pg (ref 26.0–34.0)
MCHC: 34.1 g/dL (ref 30.0–36.0)
MCV: 88.5 fL (ref 78.0–100.0)
Platelets: 224 10*3/uL (ref 150–400)
RBC: 3.84 MIL/uL — ABNORMAL LOW (ref 3.87–5.11)
RDW: 13.4 % (ref 11.5–15.5)
WBC: 8 10*3/uL (ref 4.0–10.5)

## 2013-12-11 MED ORDER — IOHEXOL 300 MG/ML  SOLN
100.0000 mL | Freq: Once | INTRAMUSCULAR | Status: AC | PRN
  Administered 2013-12-11: 100 mL via INTRAVENOUS

## 2013-12-11 MED ORDER — VANCOMYCIN 50 MG/ML ORAL SOLUTION
125.0000 mg | Freq: Four times a day (QID) | ORAL | Status: DC
  Administered 2013-12-11 – 2013-12-12 (×2): 125 mg via ORAL
  Filled 2013-12-11 (×6): qty 2.5

## 2013-12-11 MED ORDER — ONDANSETRON HCL 4 MG/2ML IJ SOLN
4.0000 mg | Freq: Once | INTRAMUSCULAR | Status: AC
  Administered 2013-12-11: 4 mg via INTRAVENOUS
  Filled 2013-12-11: qty 2

## 2013-12-11 MED ORDER — POTASSIUM CHLORIDE CRYS ER 20 MEQ PO TBCR
40.0000 meq | EXTENDED_RELEASE_TABLET | Freq: Once | ORAL | Status: AC
  Administered 2013-12-11: 40 meq via ORAL
  Filled 2013-12-11: qty 2

## 2013-12-11 MED ORDER — HYDROMORPHONE HCL PF 1 MG/ML IJ SOLN
1.0000 mg | Freq: Once | INTRAMUSCULAR | Status: AC
  Administered 2013-12-11: 1 mg via INTRAVENOUS
  Filled 2013-12-11: qty 1

## 2013-12-11 MED ORDER — SODIUM CHLORIDE 0.9 % IV BOLUS (SEPSIS)
1000.0000 mL | Freq: Once | INTRAVENOUS | Status: AC
  Administered 2013-12-11: 1000 mL via INTRAVENOUS

## 2013-12-11 MED ORDER — IOHEXOL 300 MG/ML  SOLN
50.0000 mL | Freq: Once | INTRAMUSCULAR | Status: AC | PRN
  Administered 2013-12-11: 50 mL via ORAL
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
## 2013-12-11 NOTE — ED Notes (Signed)
Pt presents via EMS with c/o abdominal pain. Pt says she has been having abdominal pain x one month. Pt told EMS that she has had diarrhea today but no vomiting. Vitals stable per EMS.

## 2013-12-11 NOTE — ED Provider Notes (Signed)
Medical screening examination/treatment/procedure(s) were conducted as a shared visit with non-physician practitioner(s) and myself.  I personally evaluated the patient during the encounter.  EKG Interpretation   None       58 yo female with recent hospitalization for C diff colitis presenting with an acute flare up of similar symptoms despite being on flagyl.  On exam, appears ill but nontoxic.  Abdomen soft but tender diffusely, worse in lower abdomen.  No R/R/G.  Normal respiratory effort, perfusion, and mental status.  Plan PO vanco and admission.     Clinical Impression: 1. C. difficile colitis       Houston Siren III, MD 12/12/13 [phone]

## 2013-12-11 NOTE — ED Notes (Signed)
Patient daughter notified of patient admission. She can be reached at  [phone]

## 2013-12-12 DIAGNOSIS — K5289 Other specified noninfective gastroenteritis and colitis: Principal | ICD-10-CM

## 2013-12-12 DIAGNOSIS — K529 Noninfective gastroenteritis and colitis, unspecified: Secondary | ICD-10-CM | POA: Diagnosis present

## 2013-12-12 DIAGNOSIS — E876 Hypokalemia: Secondary | ICD-10-CM

## 2013-12-12 DIAGNOSIS — E860 Dehydration: Secondary | ICD-10-CM | POA: Diagnosis present

## 2013-12-12 DIAGNOSIS — A0472 Enterocolitis due to Clostridium difficile, not specified as recurrent: Secondary | ICD-10-CM

## 2013-12-12 LAB — CLOSTRIDIUM DIFFICILE BY PCR: CDIFFPCR: NEGATIVE

## 2013-12-12 MED ORDER — METRONIDAZOLE IN NACL 5-0.79 MG/ML-% IV SOLN
500.0000 mg | Freq: Three times a day (TID) | INTRAVENOUS | Status: DC
  Administered 2013-12-13 – 2013-12-15 (×5): 500 mg via INTRAVENOUS
  Filled 2013-12-12 (×6): qty 100

## 2013-12-12 MED ORDER — HYDROCODONE-ACETAMINOPHEN 5-325 MG PO TABS
1.0000 | ORAL_TABLET | ORAL | Status: DC | PRN
  Administered 2013-12-12 – 2013-12-15 (×4): 1 via ORAL
  Filled 2013-12-12 (×4): qty 1

## 2013-12-12 MED ORDER — ENOXAPARIN SODIUM 60 MG/0.6ML ~~LOC~~ SOLN
50.0000 mg | Freq: Every day | SUBCUTANEOUS | Status: DC
  Administered 2013-12-12 – 2013-12-15 (×4): 50 mg via SUBCUTANEOUS
  Filled 2013-12-12 (×5): qty 0.6

## 2013-12-12 MED ORDER — ACETAMINOPHEN 325 MG PO TABS: 650.0000 mg | ORAL_TABLET | Freq: Four times a day (QID) | ORAL | Status: DC | PRN

## 2013-12-12 MED ORDER — CLONIDINE HCL 0.1 MG PO TABS
0.1000 mg | ORAL_TABLET | Freq: Every day | ORAL | Status: DC
  Administered 2013-12-12 – 2013-12-15 (×5): 0.1 mg via ORAL
  Filled 2013-12-12 (×6): qty 1

## 2013-12-12 MED ORDER — VANCOMYCIN 50 MG/ML ORAL SOLUTION
125.0000 mg | Freq: Four times a day (QID) | ORAL | Status: DC
  Administered 2013-12-12 (×2): 125 mg via ORAL
  Filled 2013-12-12 (×4): qty 2.5

## 2013-12-12 MED ORDER — AMLODIPINE BESYLATE 10 MG PO TABS
10.0000 mg | ORAL_TABLET | Freq: Every morning | ORAL | Status: DC
  Administered 2013-12-12 – 2013-12-14 (×3): 10 mg via ORAL
  Filled 2013-12-12 (×4): qty 1

## 2013-12-12 MED ORDER — ONDANSETRON HCL 4 MG/2ML IJ SOLN: 4.0000 mg | Freq: Four times a day (QID) | INTRAMUSCULAR | Status: DC | PRN

## 2013-12-12 MED ORDER — DIPHENHYDRAMINE HCL 25 MG PO CAPS
25.0000 mg | ORAL_CAPSULE | Freq: Four times a day (QID) | ORAL | Status: DC | PRN
  Administered 2013-12-12: 25 mg via ORAL
  Filled 2013-12-12: qty 1

## 2013-12-12 MED ORDER — ONDANSETRON HCL 4 MG PO TABS: 4.0000 mg | ORAL_TABLET | Freq: Four times a day (QID) | ORAL | Status: DC | PRN

## 2013-12-12 MED ORDER — SIMVASTATIN 10 MG PO TABS
10.0000 mg | ORAL_TABLET | Freq: Every day | ORAL | Status: DC
Start: 2013-12-12 — End: 2013-12-16
  Administered 2013-12-12 – 2013-12-15 (×5): 10 mg via ORAL
  Filled 2013-12-12 (×6): qty 1

## 2013-12-12 MED ORDER — POTASSIUM CHLORIDE IN NACL 40-0.9 MEQ/L-% IV SOLN
INTRAVENOUS | Status: DC
  Administered 2013-12-12: 100 mL/h via INTRAVENOUS
  Administered 2013-12-12: 23:00:00 via INTRAVENOUS
  Administered 2013-12-13: 100 mL/h via INTRAVENOUS
  Filled 2013-12-12 (×4): qty 1000

## 2013-12-12 MED ORDER — LABETALOL HCL 100 MG PO TABS
100.0000 mg | ORAL_TABLET | Freq: Three times a day (TID) | ORAL | Status: DC
  Administered 2013-12-12 – 2013-12-15 (×9): 100 mg via ORAL
  Filled 2013-12-12 (×13): qty 1

## 2013-12-12 MED ORDER — CIPROFLOXACIN IN D5W 400 MG/200ML IV SOLN
400.0000 mg | Freq: Two times a day (BID) | INTRAVENOUS | Status: DC
  Administered 2013-12-12 – 2013-12-14 (×6): 400 mg via INTRAVENOUS
  Filled 2013-12-12 (×7): qty 200

## 2013-12-12 MED ORDER — ENOXAPARIN SODIUM 40 MG/0.4ML ~~LOC~~ SOLN: 40.0000 mg | SUBCUTANEOUS | Status: DC

## 2013-12-12 MED ORDER — CITALOPRAM HYDROBROMIDE 40 MG PO TABS
40.0000 mg | ORAL_TABLET | Freq: Every morning | ORAL | Status: DC
  Administered 2013-12-12 – 2013-12-15 (×4): 40 mg via ORAL
  Filled 2013-12-12 (×5): qty 1

## 2013-12-12 MED ORDER — SACCHAROMYCES BOULARDII 250 MG PO CAPS
250.0000 mg | ORAL_CAPSULE | Freq: Two times a day (BID) | ORAL | Status: DC
  Administered 2013-12-12 – 2013-12-15 (×9): 250 mg via ORAL
  Filled 2013-12-12 (×11): qty 1

## 2013-12-12 MED ORDER — MORPHINE SULFATE 2 MG/ML IJ SOLN: 2.0000 mg | INTRAMUSCULAR | Status: DC | PRN

## 2013-12-12 MED ORDER — TRAZODONE HCL 100 MG PO TABS
300.0000 mg | ORAL_TABLET | Freq: Every day | ORAL | Status: DC
  Administered 2013-12-12 – 2013-12-15 (×5): 300 mg via ORAL
  Filled 2013-12-12 (×6): qty 3

## 2013-12-12 MED ORDER — DIPHENHYDRAMINE HCL 25 MG PO CAPS
25.0000 mg | ORAL_CAPSULE | Freq: Once | ORAL | Status: AC
  Administered 2013-12-12: 25 mg via ORAL
  Filled 2013-12-12: qty 1

## 2013-12-12 MED ORDER — ALPRAZOLAM 1 MG PO TABS: 1.0000 mg | ORAL_TABLET | Freq: Three times a day (TID) | ORAL | Status: DC | PRN

## 2013-12-12 MED ORDER — ACETAMINOPHEN 650 MG RE SUPP: 650.0000 mg | Freq: Four times a day (QID) | RECTAL | Status: DC | PRN

## 2013-12-12 NOTE — Progress Notes (Addendum)
[facility] PROGRESS NOTE  Rachel [name] UEA:[id number] DOB: 1956-07-29 DOA: 12/11/2013 PCP: Maggie Font, MD  Assessment/Plan  Colitis, acute of the cecum and ascending colon.  Treated last month for C. Diff colitis, now C. Diff negative.  Increased suspicion for IBD vs. Recurrent infection.  Ischemia less likely.   -  C. Diff negative -  D/c oral vancomycin -  Continue cipro and add flagyl -  Monitor for rash with ciprofloxacin >> possible allergic reaction during last admission in early January.  If rash, then will add as allergy -  Continue florastor -  GI pathogen panel -  Stool culture -  O&P -  Consult GI in AM:  Seen last time by Drs. Rourk and Rehman  Hypertension BP elevated Continue amlodipine and clonidine  -  Add labetalol  Hyperlipidemia  Continue statin  Depression  Continue trazodone and Celexa   Hypokalemia due to GI losses, potassium in IVF    Diet:  Full liquid diet Access:  PIV IVF:  yes Proph:  lovenox  Code Status: full Family Communication: patient alone Disposition Plan: pending improvement in diarrhea and abdominal cramps/colitis   Consultants:  GI >> will consult in AM  Procedures:  CT abd/pelvis  Antibiotics:  Vancomycin oral 2/15   Cipro + flagyl 2/15 >>   HPI/Subjective:  Persistent nausea, vomiting, and diarrhea with abdominal pain.  Feels terrible and never wants to experience this again  Objective: Filed Vitals:   12/12/13 0042 12/12/13 0605 12/12/13 0950 12/12/13 1408  BP: 177/80 146/71 150/70 162/66  Pulse: 107 89    Temp: 98.4 F (36.9 C) 97.9 F (36.6 C)  98.7 F (37.1 C)  TempSrc: Oral Oral  Oral  Resp: 18 18  16   Height: 5\' 4"  (1.626 m)     Weight: 104.1 kg (229 lb 8 oz)     SpO2: 96% 94%  97%    Intake/Output Summary (Last 24 hours) at 12/12/13 1740 Last data filed at 12/12/13 0900  Gross per 24 hour  Intake    360 ml  Output      0 ml  Net    360 ml   Filed Weights   12/12/13 0042   Weight: 104.1 kg (229 lb 8 oz)    Exam:   General:  Obese BF, No acute distress  HEENT:  NCAT, MMM  Cardiovascular:  RRR, nl S1, S2 no mrg, 2+ pulses, warm extremities  Respiratory:  CTAB, no increased WOB  Abdomen:   Hyperactive BS, soft, mildly distended, TTP diffusely without rebound or guarding  MSK:   Normal tone and bulk, no LEE  Neuro:  Grossly intact  Data Reviewed: Basic Metabolic Panel:  Recent Labs Lab 12/11/13 2130  NA 138  K 3.1*  CL 99  CO2 22  GLUCOSE 126*  BUN 7  CREATININE 0.83  CALCIUM 9.5   Liver Function Tests:  Recent Labs Lab 12/11/13 2130  AST 18  ALT 11  ALKPHOS 55  BILITOT 0.4  PROT 7.3  ALBUMIN 3.7   No results found for this basename: LIPASE, AMYLASE,  in the last 168 hours No results found for this basename: AMMONIA,  in the last 168 hours CBC:  Recent Labs Lab 12/11/13 2300  WBC 8.0  HGB 11.6*  HCT 34.0*  MCV 88.5  PLT 224   Cardiac Enzymes: No results found for this basename: CKTOTAL, CKMB, CKMBINDEX, TROPONINI,  in the last 168 hours BNP (last 3 results) No results found for this  basename: PROBNP,  in the last 8760 hours CBG: No results found for this basename: GLUCAP,  in the last 168 hours  Recent Results (from the past 240 hour(s))  CLOSTRIDIUM DIFFICILE BY PCR     Status: None   Collection Time    12/12/13  6:09 AM      Result Value Ref Range Status   C difficile by pcr NEGATIVE  NEGATIVE Final   Comment: Performed at [facility]     Studies: Ct Abdomen Pelvis W Contrast  12/11/2013   CLINICAL DATA:  Lower abdominal pain  EXAM: CT ABDOMEN AND PELVIS WITH CONTRAST  TECHNIQUE: Multidetector CT imaging of the abdomen and pelvis was performed using the standard protocol following bolus administration of intravenous contrast.  CONTRAST:  125mL OMNIPAQUE IOHEXOL 300 MG/ML  SOLN  COMPARISON:  Recent prior CT abdomen/ pelvis 11/04/2013  FINDINGS: Lower Chest: Mild lower lobe it dependent atelectasis.  Otherwise, the lung bases are clear. Cardiomegaly. Oral contrast material is noted within the distal thoracic esophagus suggesting reflux.  Abdomen: Unremarkable CT appearance of the stomach, spleen, adrenal glands and pancreas. Normal hepatic contour and morphology. No discrete hepatic lesion. Gallbladder is unremarkable. No intra or extrahepatic biliary ductal dilatation.  Unremarkable appearance of the bilateral kidneys. No focal solid lesion, hydronephrosis or nephrolithiasis.  Abnormal submucosal edema of the cecum and ascending colon. Inflammatory stranding is noted in the pericolonic fat and ascending colonic mesial colon. Additionally, there are mildly prominent mesial colonic lymph nodes. The largest measures 8 mm in Pride Gonzales axis on image 39 of series 2. Normal appendix identified in the right lower quadrant. There is also a Guled Gahan segment of abnormal transverse (D3) duodenum. There is haziness in blurring of the mildly thickened bowel wall with some adjacent very duodenum inflammatory stranding. The terminal ileum is unremarkable. The remainder of the colon is unremarkable. No significant diverticulosis. No evidence of bowel obstruction. No free air or free fluid. Mild thickening of the right anterior para renal fascia adjacent to the inflamed ascending colon.  Pelvis: Round low-attenuation 2.3 x 2.4 cm structure in uterine fundus is favored to reflect a degenerated uterine fibroid. No free fluid. Slightly limited evaluation secondary to streak artifact from the right hip prosthesis. The bladder appears unremarkable.  Bones/Soft Tissues: No acute fracture or aggressive appearing lytic or blastic osseous lesion. A multilevel degenerative spurring throughout the thoracic spine. Lower lumbar degenerative disc disease and facet arthropathy. Right hip arthroplasty incompletely imaged. No evidence of complication in the imaged portion. Surgical changes of prior left femoral repair with intra medullary nail are also  incompletely imaged.  Vascular: Trace atherosclerotic calcifications the infrarenal abdominal aorta. No significant stenosis or aneurysmal dilatation.  IMPRESSION: 1. Cecal and ascending colitis without evidence of abscess or perforation. Both infectious and inflammatory etiologies are differential considerations. Given the distribution, ischemia is considerably less likely. Inflammatory changes in the horizontal (D3) segment of the duodenum are favored to be reactive and related to the underlying colonic process. Alternately, a skip region of bowel inflammation is a possibility if the patient has a clinical history of Crohn's disease. 2. Borderline enlarged adenopathy in the ascending colonic mesocolon favored to be reactive. 3. Round low-attenuation structure in the uterine fundus is favored to reflect a degenerated uterine fibroid. 4. Oral contrast material in the thoracic esophagus suggests reflux.   Electronically Signed   By: Jacqulynn Cadet M.D.   On: 12/11/2013 22:53    Scheduled Meds: . amLODipine  10 mg Oral q  morning - 10a  . ciprofloxacin  400 mg Intravenous Q12H  . citalopram  40 mg Oral q morning - 10a  . cloNIDine  0.1 mg Oral QHS  . enoxaparin (LOVENOX) injection  50 mg Subcutaneous Daily  . saccharomyces boulardii  250 mg Oral BID  . simvastatin  10 mg Oral q1800  . traZODone  300 mg Oral QHS  . vancomycin  125 mg Oral 4 times per day  . vancomycin  125 mg Oral 4 times per day   Continuous Infusions: . 0.9 % NaCl with KCl 40 mEq / L 100 mL/hr (12/12/13 1158)    Principal Problem:   Colitis, acute Active Problems:   OBESITY   DEPRESSION   HYPERTENSION   OSTEOARTHRITIS   Colitis   C. difficile colitis   Dehydration   Hypokalemia    Time spent: 30 min    Ryah Cribb, [facility]  [facility] Pager [phone]. If 7PM-7AM, please contact night-coverage at [email or web address], password [email or web address] 12/12/2013, 5:40 PM  LOS: 1 day

## 2013-12-12 NOTE — Progress Notes (Signed)
Utilization review completed.  
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
## 2013-12-12 NOTE — ED Provider Notes (Signed)
Medical screening examination/treatment/procedure(s) were conducted as a shared visit with non-physician practitioner(s) and myself.  I personally evaluated the patient during the encounter.  EKG Interpretation   None         Houston Siren III, MD 12/12/13 [id number]

## 2013-12-12 NOTE — Progress Notes (Signed)
Pt ambulating to the bathroom, tolerates well. Rates pain #3, states doesn't need any pain meds.

## 2013-12-12 NOTE — Progress Notes (Signed)
Pt arrived to floor room 1514 via stretcher. Walked to bed w/ steady gait. VS taken, Pt oriented to room. General weakness and 7/10 pain reported.Initial assessment complete . Will continue to monitor thru shift.

## 2013-12-13 DIAGNOSIS — I10 Essential (primary) hypertension: Secondary | ICD-10-CM

## 2013-12-13 LAB — GI PATHOGEN PANEL BY PCR, STOOL
C difficile toxin A/B: NEGATIVE
Campylobacter by PCR: NEGATIVE
Cryptosporidium by PCR: NEGATIVE
E COLI (ETEC) LT/ST: NEGATIVE
E coli (STEC): NEGATIVE
E coli 0157 by PCR: NEGATIVE
G lamblia by PCR: NEGATIVE
NOROVIRUS G1/G2: NEGATIVE
Rotavirus A by PCR: NEGATIVE
SALMONELLA BY PCR: NEGATIVE
SHIGELLA BY PCR: NEGATIVE

## 2013-12-13 LAB — BASIC METABOLIC PANEL
BUN: 3 mg/dL — ABNORMAL LOW (ref 6–23)
CALCIUM: 8.1 mg/dL — AB (ref 8.4–10.5)
CO2: 24 meq/L (ref 19–32)
CREATININE: 0.75 mg/dL (ref 0.50–1.10)
Chloride: 106 mEq/L (ref 96–112)
GFR calc Af Amer: >90 mL/min (ref >90)
GFR calc non Af Amer: >90 mL/min (ref >90)
Glucose, Bld: 102 mg/dL — ABNORMAL HIGH (ref 70–99)
Potassium: 3.9 mEq/L (ref 3.7–5.3)
Sodium: 140 mEq/L (ref 137–147)

## 2013-12-13 MED ORDER — BOOST / RESOURCE BREEZE PO LIQD
1.0000 | Freq: Two times a day (BID) | ORAL | Status: DC
  Administered 2013-12-13 – 2013-12-15 (×3): 1 via ORAL

## 2013-12-13 MED ORDER — SODIUM CHLORIDE 0.9 % IV SOLN
INTRAVENOUS | Status: DC
  Administered 2013-12-13: 1000 mL via INTRAVENOUS
  Administered 2013-12-14: 500 mL via INTRAVENOUS

## 2013-12-13 MED ORDER — PEG 3350-KCL-NA BICARB-NACL 420 G PO SOLR
4000.0000 mL | Freq: Once | ORAL | Status: AC
  Administered 2013-12-13: 4000 mL via ORAL
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
## 2013-12-13 NOTE — Progress Notes (Signed)
[facility] PROGRESS NOTE  GILBERT BITTENBENDER [id number] DOB: [birth year] DOA: 12/11/2013 PCP: Maggie Font, MD  Assessment/Plan  Colitis, acute of the cecum and ascending colon.  Treated last month for C. Diff colitis, now C. Diff negative.  Increased suspicion for IBD vs. Recurrent infection.  Ischemia less likely given distribution.  -  C. Diff negative -  Continue cipro and add flagyl -  Monitor for rash with ciprofloxacin >> possible allergic reaction during last admission in early January.  If rash, then will add as allergy -  Continue florastor -  GI pathogen panel pending -  Stool culture pending -  O&P pending -  Patient requesting diagnostic colonoscopy.  Spoke with Dr. Roseanne Kaufman office about colo and they recommended calling unassigned GI MD for [facility].  Dr. Collene Mares consulted.    Hypertension BP stable.   Continue amlodipine and clonidine  -  Continue labetalol  Hyperlipidemia  Continue statin  Depression  Continue trazodone and Celexa   Hypokalemia due to GI losses, resolved with potassium in IVF.     Diet:  Full liquid diet Access:  PIV IVF:  off Proph:  lovenox  Code Status: full Family Communication: patient alone Disposition Plan: pending improvement in diarrhea and abdominal cramps/colitis, GI consult, possible colo.     Consultants:  GI, Dr. Collene Mares  Procedures:  CT abd/pelvis  Antibiotics:  Vancomycin oral 2/15   Cipro + flagyl 2/15 >>   HPI/Subjective:  Persistent nausea, vomiting, and diarrhea with abdominal pain.  Stools are green and mucousy but less frequent.  Denies blood.  Abdominal pain at rest is worse on left side.    Objective: Filed Vitals:   12/13/13 0533 12/13/13 1112 12/13/13 1115 12/13/13 1345  BP: 121/70 121/68 121/68 113/61  Pulse: 78 79 79 87  Temp: 98 F (36.7 C) 98.7 F (37.1 C)  98.5 F (36.9 C)  TempSrc: Oral Oral  Oral  Resp: 16 18  18   Height:      Weight:      SpO2: 93% 98%  97%    Intake/Output  Summary (Last 24 hours) at 12/13/13 1350 Last data filed at 12/13/13 0930  Gross per 24 hour  Intake 3193.33 ml  Output      0 ml  Net 3193.33 ml   Filed Weights   12/12/13 0042  Weight: 104.1 kg (229 lb 8 oz)    Exam:   General:  Obese BF, No acute distress  HEENT:  NCAT, MMM  Cardiovascular:  RRR, nl S1, S2 no mrg, 2+ pulses, warm extremities  Respiratory:  CTAB, no increased WOB  Abdomen:   Hyperactive BS, soft, mildly distended, TTP diffusely without rebound or guarding, worse in RLQ and RUQ  MSK:   Normal tone and bulk, no LEE  Neuro:  Grossly intact  Data Reviewed: Basic Metabolic Panel:  Recent Labs Lab 12/11/13 2130 12/13/13 0505  NA 138 140  K 3.1* 3.9  CL 99 106  CO2 22 24  GLUCOSE 126* 102*  BUN 7 3*  CREATININE 0.83 0.75  CALCIUM 9.5 8.1*   Liver Function Tests:  Recent Labs Lab 12/11/13 2130  AST 18  ALT 11  ALKPHOS 55  BILITOT 0.4  PROT 7.3  ALBUMIN 3.7   No results found for this basename: LIPASE, AMYLASE,  in the last 168 hours No results found for this basename: AMMONIA,  in the last 168 hours CBC:  Recent Labs Lab 12/11/13 2300  WBC 8.0  HGB 11.6*  HCT  34.0*  MCV 88.5  PLT 224   Cardiac Enzymes: No results found for this basename: CKTOTAL, CKMB, CKMBINDEX, TROPONINI,  in the last 168 hours BNP (last 3 results) No results found for this basename: PROBNP,  in the last 8760 hours CBG: No results found for this basename: GLUCAP,  in the last 168 hours  Recent Results (from the past 240 hour(s))  CLOSTRIDIUM DIFFICILE BY PCR     Status: None   Collection Time    12/12/13  6:09 AM      Result Value Ref Range Status   C difficile by pcr NEGATIVE  NEGATIVE Final   Comment: Performed at [facility]     Studies: Ct Abdomen Pelvis W Contrast  12/11/2013   CLINICAL DATA:  Lower abdominal pain  EXAM: CT ABDOMEN AND PELVIS WITH CONTRAST  TECHNIQUE: Multidetector CT imaging of the abdomen and pelvis was performed using  the standard protocol following bolus administration of intravenous contrast.  CONTRAST:  171mL OMNIPAQUE IOHEXOL 300 MG/ML  SOLN  COMPARISON:  Recent prior CT abdomen/ pelvis 11/04/2013  FINDINGS: Lower Chest: Mild lower lobe it dependent atelectasis. Otherwise, the lung bases are clear. Cardiomegaly. Oral contrast material is noted within the distal thoracic esophagus suggesting reflux.  Abdomen: Unremarkable CT appearance of the stomach, spleen, adrenal glands and pancreas. Normal hepatic contour and morphology. No discrete hepatic lesion. Gallbladder is unremarkable. No intra or extrahepatic biliary ductal dilatation.  Unremarkable appearance of the bilateral kidneys. No focal solid lesion, hydronephrosis or nephrolithiasis.  Abnormal submucosal edema of the cecum and ascending colon. Inflammatory stranding is noted in the pericolonic fat and ascending colonic mesial colon. Additionally, there are mildly prominent mesial colonic lymph nodes. The largest measures 8 mm in Oaklen Thiam axis on image 39 of series 2. Normal appendix identified in the right lower quadrant. There is also a Hien Cunliffe segment of abnormal transverse (D3) duodenum. There is haziness in blurring of the mildly thickened bowel wall with some adjacent very duodenum inflammatory stranding. The terminal ileum is unremarkable. The remainder of the colon is unremarkable. No significant diverticulosis. No evidence of bowel obstruction. No free air or free fluid. Mild thickening of the right anterior para renal fascia adjacent to the inflamed ascending colon.  Pelvis: Round low-attenuation 2.3 x 2.4 cm structure in uterine fundus is favored to reflect a degenerated uterine fibroid. No free fluid. Slightly limited evaluation secondary to streak artifact from the right hip prosthesis. The bladder appears unremarkable.  Bones/Soft Tissues: No acute fracture or aggressive appearing lytic or blastic osseous lesion. A multilevel degenerative spurring throughout the  thoracic spine. Lower lumbar degenerative disc disease and facet arthropathy. Right hip arthroplasty incompletely imaged. No evidence of complication in the imaged portion. Surgical changes of prior left femoral repair with intra medullary nail are also incompletely imaged.  Vascular: Trace atherosclerotic calcifications the infrarenal abdominal aorta. No significant stenosis or aneurysmal dilatation.  IMPRESSION: 1. Cecal and ascending colitis without evidence of abscess or perforation. Both infectious and inflammatory etiologies are differential considerations. Given the distribution, ischemia is considerably less likely. Inflammatory changes in the horizontal (D3) segment of the duodenum are favored to be reactive and related to the underlying colonic process. Alternately, a skip region of bowel inflammation is a possibility if the patient has a clinical history of Crohn's disease. 2. Borderline enlarged adenopathy in the ascending colonic mesocolon favored to be reactive. 3. Round low-attenuation structure in the uterine fundus is favored to reflect a degenerated uterine fibroid. 4. Oral  contrast material in the thoracic esophagus suggests reflux.   Electronically Signed   By: Jacqulynn Cadet M.D.   On: 12/11/2013 22:53    Scheduled Meds: . amLODipine  10 mg Oral q morning - 10a  . ciprofloxacin  400 mg Intravenous Q12H  . citalopram  40 mg Oral q morning - 10a  . cloNIDine  0.1 mg Oral QHS  . enoxaparin (LOVENOX) injection  50 mg Subcutaneous Daily  . labetalol  100 mg Oral TID  . metronidazole  500 mg Intravenous Q8H  . saccharomyces boulardii  250 mg Oral BID  . simvastatin  10 mg Oral q1800  . traZODone  300 mg Oral QHS   Continuous Infusions: . 0.9 % NaCl with KCl 40 mEq / L 100 mL/hr (12/13/13 1116)    Principal Problem:   Colitis, acute Active Problems:   OBESITY   DEPRESSION   HYPERTENSION   OSTEOARTHRITIS   Colitis   C. difficile colitis   Dehydration    Hypokalemia    Time spent: 30 min    Auriella Wieand, [facility]  [facility] Pager [phone]. If 7PM-7AM, please contact night-coverage at [email or web address], password [facility] 12/13/2013, 1:50 PM  LOS: 2 days
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
## 2013-12-14 ENCOUNTER — Encounter (HOSPITAL_COMMUNITY): Payer: Self-pay | Admitting: *Deleted

## 2013-12-14 ENCOUNTER — Encounter (HOSPITAL_COMMUNITY)
Admission: EM | Disposition: A | Discharge status: Home / Self Care | Payer: Self-pay | Source: Home / Self Care | Attending: Internal Medicine

## 2013-12-14 DIAGNOSIS — E441 Mild protein-calorie malnutrition: Secondary | ICD-10-CM

## 2013-12-14 HISTORY — PX: COLONOSCOPY: SHX5424

## 2013-12-14 HISTORY — PX: ESOPHAGOGASTRODUODENOSCOPY: SHX5428

## 2013-12-14 SURGERY — EGD (ESOPHAGOGASTRODUODENOSCOPY)
Anesthesia: Moderate Sedation

## 2013-12-14 MED ORDER — LOPERAMIDE HCL 2 MG PO CAPS: 2.0000 mg | ORAL_CAPSULE | ORAL | Status: DC | PRN

## 2013-12-14 MED ORDER — FENTANYL CITRATE 0.05 MG/ML IJ SOLN
INTRAMUSCULAR | Status: AC
  Filled 2013-12-14: qty 4

## 2013-12-14 MED ORDER — DIPHENHYDRAMINE HCL 50 MG/ML IJ SOLN
INTRAMUSCULAR | Status: AC
  Filled 2013-12-14: qty 1

## 2013-12-14 MED ORDER — MIDAZOLAM HCL 10 MG/2ML IJ SOLN
INTRAMUSCULAR | Status: DC | PRN
  Administered 2013-12-14: 1 mg via INTRAVENOUS
  Administered 2013-12-14 (×4): 2 mg via INTRAVENOUS

## 2013-12-14 MED ORDER — FENTANYL CITRATE 0.05 MG/ML IJ SOLN
INTRAMUSCULAR | Status: DC | PRN
  Administered 2013-12-14 (×5): 25 ug via INTRAVENOUS

## 2013-12-14 MED ORDER — MIDAZOLAM HCL 10 MG/2ML IJ SOLN
INTRAMUSCULAR | Status: AC
  Filled 2013-12-14: qty 4

## 2013-12-14 NOTE — Progress Notes (Signed)
This shift pt tolerated 32oz GOLYTELY, which is still producing loose green stool. Pt understands to continue drinking to prep for procedure later today.

## 2013-12-14 NOTE — Clinical Documentation Improvement (Signed)
INITIAL NUTRITION ASSESSMENT    12/13/2013   Possible Clinical Conditions?  Severe Malnutrition   Protein Calorie Malnutrition, degree ______Mild  _________Moderate Severe Protein Calorie Malnutrition  Other Condition Cannot clinically determine  Supporting Information: Risk Factors:Colitis, acute;  Obesity   Diagnostics:Patient meets criteria for moderate malnutrition related acute illness AEB 6% weight loss in the past month and intake <75% for the past month.  Treatment:INTERVENTION:  Resource Breeze bid  Continue Low Fiber Diet  Continue Florastor  RD following Monitor:  Labs, diet tolerance and intake, weight   Thank You, Philippa Chester ,RN Clinical Documentation Specialist:  [place] [employer]

## 2013-12-14 NOTE — Progress Notes (Addendum)
[facility] PROGRESS NOTE  Rachel [name] CXK:[id number] DOB: Feb 03, 1956 DOA: 12/11/2013 PCP: Rachel Font, MD  Brief Summary  58 year old obese female with history of hypertension, dyslipidemia, depression and osteoarthritis who was admitted to and been hospital one month back for C. difficile colitis. Patient initially presented to the ED with abdominal pain diarrhea and was found to have diffuse colitis on CT scan and discharged home on ciprofloxacin and Flagyl. She returned back to days later with persistent diarrhea and had stool for C. difficile positive along with HCAP positive influenza. Patient was started on oral vancomycin and discharged home with a 2 weeks course of antibiotics. Patient was seen by GI during the hospital stay. Patient has had a colonoscopy in 2011 with finding of tubular adenoma. No personal or family history of Crohn's disease.  She represented with weight loss, persistent diarrhea both watery and formed, brownish to greenish in color without any mucus or blood and crampy abdominal pain mainly over the right lower quadrant.  Cecal and ascending colitis seen on CT.  C. Diff and GI pathogen panel negative and diarrhea not improving so called GI who performed upper and lower endoscopy today.  Still having diarrhea, although, now probably predominantly from bowel prep.  Possible discharge home once diarrhea improving.     Assessment/Plan  Colitis, acute of the cecum and ascending colon.  Treated last month for C. Diff colitis, now C. Diff negative.  Increased suspicion for IBD vs. Recurrent infection.  Ischemia less likely given distribution. Profuse stools last night due to clean-out for colo today.   -  C. Diff negative -  GI pathogen panel neg -  Continue cipro and flagyl -  Continue florastor -  Start imodium -  Dr. Benson [name] performed EGD and colo:  Mild colitis, bx taken.   -  F/u biopsy report -  Pt to follow up with Dr. Oneida [name] as outpatient  Hypertension  BP stable.   Continue amlodipine and clonidine  -  Continue labetalol  Hyperlipidemia  Continue statin  Depression  Continue trazodone and Celexa   Hypokalemia due to GI losses, resolved with potassium in IVF.    Mild protein-calorie malnutrition -  Appreciate nutrition recommendations -  supplements  Diet:  Regular diet Access:  PIV IVF:  off Proph:  lovenox  Code Status: full Family Communication: patient alone Disposition Plan: pending improvement in diarrhea and abdominal cramps/colitis, GI consult, possible colo.     Consultants:  GI, Dr. Collene [name]  Procedures:  CT abd/pelvis  Antibiotics:  Vancomycin oral 2/15   Cipro + flagyl 2/15 >>   HPI/Subjective:  Persistent nausea without vomiting.  Had copious stools overnight due to colo prep.  Awaiting scopes.     Objective: Filed Vitals:   12/14/13 1412 12/14/13 1420 12/14/13 1430 12/14/13 1441  BP: 135/64 118/56 118/56 111/50  Pulse: 85     Temp:      TempSrc:      Resp: 14 66 16 17  Height:      Weight:      SpO2: 98% 97% 97% 98%   No intake or output data in the 24 hours ending 12/14/13 1536 Filed Weights   12/12/13 0042  Weight: 104.1 kg (229 lb 8 oz)    Exam:   General:  Obese BF, No acute distress  HEENT:  NCAT, MMM  Cardiovascular:  RRR, nl S1, S2 no mrg, 2+ pulses, warm extremities  Respiratory:  CTAB, no increased WOB  Abdomen:   Hyperactive BS,  soft, mildly distended, TTP diffusely without rebound or guarding, worse in RLQ and RUQ  MSK:   Normal tone and bulk, no LEE  Neuro:  Grossly intact  Data Reviewed: Basic Metabolic Panel:  Recent Labs Lab 12/11/13 2130 12/13/13 0505  NA 138 140  K 3.1* 3.9  CL 99 106  CO2 22 24  GLUCOSE 126* 102*  BUN 7 3*  CREATININE 0.83 0.75  CALCIUM 9.5 8.1*   Liver Function Tests:  Recent Labs Lab 12/11/13 2130  AST 18  ALT 11  ALKPHOS 55  BILITOT 0.4  PROT 7.3  ALBUMIN 3.7   No results found for this basename: LIPASE,  AMYLASE,  in the last 168 hours No results found for this basename: AMMONIA,  in the last 168 hours CBC:  Recent Labs Lab 12/11/13 2300  WBC 8.0  HGB 11.6*  HCT 34.0*  MCV 88.5  PLT 224   Cardiac Enzymes: No results found for this basename: CKTOTAL, CKMB, CKMBINDEX, TROPONINI,  in the last 168 hours BNP (last 3 results) No results found for this basename: PROBNP,  in the last 8760 hours CBG: No results found for this basename: GLUCAP,  in the last 168 hours  Recent Results (from the past 240 hour(s))  CLOSTRIDIUM DIFFICILE BY PCR     Status: None   Collection Time    12/12/13  6:09 AM      Result Value Ref Range Status   C difficile by pcr NEGATIVE  NEGATIVE Final   Comment: Performed at [facility]     Studies: No results found.  Scheduled Meds: . amLODipine  10 mg Oral q morning - 10a  . ciprofloxacin  400 mg Intravenous Q12H  . citalopram  40 mg Oral q morning - 10a  . cloNIDine  0.1 mg Oral QHS  . enoxaparin (LOVENOX) injection  50 mg Subcutaneous Daily  . feeding supplement (RESOURCE BREEZE)  1 Container Oral BID BM  . labetalol  100 mg Oral TID  . metronidazole  500 mg Intravenous Q8H  . saccharomyces boulardii  250 mg Oral BID  . simvastatin  10 mg Oral q1800  . traZODone  300 mg Oral QHS   Continuous Infusions:    Principal Problem:   Colitis, acute Active Problems:   OBESITY   DEPRESSION   HYPERTENSION   OSTEOARTHRITIS   Colitis   Dehydration   Hypokalemia    Time spent: 30 min    Rachel [name], [facility]  [facility] Pager [phone]. If 7PM-7AM, please contact night-coverage at [email or web address], password [facility] 12/14/2013, 3:36 PM  LOS: 3 days

## 2013-12-14 NOTE — Op Note (Signed)
[facility] [place], [address]   OPERATIVE PROCEDURE REPORT  PATIENT: Rachel [name], Rachel [name]  MR#: [id number] BIRTHDATE: Sep 08, 1956  GENDER: Female ENDOSCOPIST: Carol Ada, MD ASSISTANT:   Clemmie Krill, RN, BSN Corliss Parish, technician PROCEDURE DATE: 12/14/2013 PROCEDURE:   Colonoscopy with biopsy ASA CLASS:   Class III INDICATIONS: Abnormal CT scan - Proximal colitis MEDICATIONS: Versed 9 mg IV and Fentanyl 125 mcg IV  DESCRIPTION OF PROCEDURE:   After the risks benefits and alternatives of the procedure were thoroughly explained, informed consent was obtained.  A digital rectal exam revealed no abnormalities of the rectum.    The Pentax Adult Colonscope [id number] endoscope was introduced through the anus  and advanced to the terminal ileum which was intubated for a short distance , No adverse events experienced.    The quality of the prep was excellent. .  The instrument was then slowly withdrawn as the colon was fully examined.     FINDINGS: The TI was normal.  From the cecum to the mid/distal transverse colon a mild colitis was identified.  Random cold biopsies were obtained.  No evidence of any ulcerations, erosions, masses, polyps, or vascular abnormalities identified.   Retroflexed views revealed internal/external hemorrhoids.     The scope was then withdrawn from the patient and the procedure terminated.  COMPLICATIONS: There were no complications.  IMPRESSION: 1) Mild colitis s/p biopsies. 2) Int/Ext Hemorrhoids.  RECOMMENDATIONS: 1) Await biopsy results. 2) Continue with antibiotics. 3) Follow up with Dr. Oneida Alar upon discharge.  _______________________________ eSigned:  Carol Ada, MD 12/14/2013 2:17 PM

## 2013-12-14 NOTE — Op Note (Signed)
[facility] [place], [address]   OPERATIVE PROCEDURE REPORT  PATIENT: Rachel [name], Rachel [name]  MR#: [id number] BIRTHDATE: [birth year]  GENDER: Female ENDOSCOPIST: Carol Ada, MD ASSISTANT:   Clemmie Krill, RN, BSN and Corliss Parish, technician PROCEDURE DATE: 12/14/2013 PROCEDURE:   EGD with biopsies ASA CLASS:   Class III INDICATIONS:Unexplained diarrhea. MEDICATIONS: See the colonosopy report TOPICAL ANESTHETIC:   none  DESCRIPTION OF PROCEDURE:   After the risks benefits and alternatives of the procedure were thoroughly explained, informed consent was obtained.  The Barney [id number]  endoscope was introduced through the mouth  and advanced to the second portion of the duodenum Without limitations.      The instrument was slowly withdrawn as the mucosa was fully examined.    FINDINGS:  The upper, middle and distal third of the esophagus were carefully inspected and no abnormalities were noted.  The z-line was well seen at the GEJ.  The endoscope was pushed into the fundus which was normal including a retroflexed view.  The antrum, gastric body, first and second part of the duodenum were unremarkable Random cold duodenal biopsies were obtained.   Retroflexed views revealed no abnormalities.     The scope was then withdrawn from the patient and the procedure terminated.  COMPLICATIONS: There were no complications. IMPRESSION: 1) Normal EGD  RECOMMENDATIONS: 1) Await biopsy results. 2) Proceed with the colonoscopy.  _______________________________ eSignedCarol Ada, MD 12/14/2013 2:19 PM

## 2013-12-15 ENCOUNTER — Encounter (HOSPITAL_COMMUNITY): Payer: Self-pay | Admitting: Gastroenterology

## 2013-12-15 DIAGNOSIS — R109 Unspecified abdominal pain: Secondary | ICD-10-CM

## 2013-12-15 LAB — BASIC METABOLIC PANEL
BUN: 8 mg/dL (ref 6–23)
CHLORIDE: 104 meq/L (ref 96–112)
CO2: 25 meq/L (ref 19–32)
CREATININE: 0.93 mg/dL (ref 0.50–1.10)
Calcium: 9.2 mg/dL (ref 8.4–10.5)
GFR calc Af Amer: 76 mL/min — ABNORMAL LOW (ref >90)
GFR calc non Af Amer: 66 mL/min — ABNORMAL LOW (ref >90)
Glucose, Bld: 126 mg/dL — ABNORMAL HIGH (ref 70–99)
Potassium: 4.1 mEq/L (ref 3.7–5.3)
Sodium: 140 mEq/L (ref 137–147)

## 2013-12-15 MED ORDER — CIPROFLOXACIN HCL 500 MG PO TABS
500.0000 mg | ORAL_TABLET | Freq: Two times a day (BID) | ORAL | Status: DC
Start: 2013-12-15 — End: 2013-12-16
  Administered 2013-12-15 – 2013-12-16 (×3): 500 mg via ORAL
  Filled 2013-12-15 (×5): qty 1

## 2013-12-15 MED ORDER — METRONIDAZOLE 500 MG PO TABS
500.0000 mg | ORAL_TABLET | Freq: Three times a day (TID) | ORAL | Status: DC
  Administered 2013-12-15 – 2013-12-16 (×3): 500 mg via ORAL
  Filled 2013-12-15 (×6): qty 1

## 2013-12-15 NOTE — Progress Notes (Signed)
[facility] PROGRESS NOTE  Rachel [name] OZH:[id number] DOB: 29-May-1956 DOA: 12/11/2013 PCP: Maggie Font, MD    Assessment/Plan  Colitis, acute of the cecum and ascending colon.  Treated last month for C. Diff colitis, now C. Diff negative.  Increased suspicion for IBD vs. Recurrent infection.  Ischemia less likely given distribution. Profuse stools last night due to clean-out for colo today.   -  C. Diff negative -  GI pathogen panel neg -  Continue cipro and flagyl day 4. Change to oral today.  -  Continue florastor -  Dr. Benson Norway performed EGD and colo:  Mild colitis, bx taken.   -  F/u biopsy report -  Pt to follow up with Dr. Oneida Alar as outpatient - Monitor for 24 hours.   Hypertension BP stable.   Continueclonidine  -  Continue labetalol -  Hold Norvasc SBP 110 range,   Hyperlipidemia  Continue statin  Depression  Continue trazodone and Celexa   Hypokalemia due to GI losses, resolved with potassium in IVF.    Mild protein-calorie malnutrition -  Appreciate nutrition recommendations -  supplements  Diet:  Regular diet Access:  PIV IVF:  off Proph:  lovenox  Code Status: full Family Communication: patient alone Disposition Plan: pending improvement in diarrhea and abdominal cramps/colitis.    Consultants:  GI, Dr. Collene Mares  Procedures:  CT abd/pelvis  Antibiotics:  Vancomycin oral 2/15   Cipro + flagyl 2/15 >>   HPI/Subjective: Nausea better. Had mild abdominal pain this am 5/10. Had one episode of diarrhea this am.    Objective: Filed Vitals:   12/14/13 1441 12/14/13 1601 12/14/13 2214 12/15/13 0611  BP: 111/50 138/70 114/67 119/58  Pulse:  87 80 82  Temp:  97.8 F (36.6 C) 98.1 F (36.7 C) 99.5 F (37.5 C)  TempSrc:  Oral Oral Oral  Resp: 17 16 16    Height:      Weight:      SpO2: 98% 97% 96% 94%    Intake/Output Summary (Last 24 hours) at 12/15/13 0844 Last data filed at 12/14/13 1843  Gross per 24 hour  Intake    780 ml   Output      0 ml  Net    780 ml   Filed Weights   12/12/13 0042  Weight: 104.1 kg (229 lb 8 oz)    Exam:   General:  Obese BF, No acute distress  HEENT:  NCAT, MMM  Cardiovascular:  RRR, nl S1, S2 no mrg, 2+ pulses, warm extremities  Respiratory:  CTAB, no increased WOB  Abdomen:   Hyperactive BS, soft, mildly distended, mild tender right side.   MSK:   Normal tone and bulk, no LEE  Neuro:  Grossly intact  Data Reviewed: Basic Metabolic Panel:  Recent Labs Lab 12/11/13 2130 12/13/13 0505  NA 138 140  K 3.1* 3.9  CL 99 106  CO2 22 24  GLUCOSE 126* 102*  BUN 7 3*  CREATININE 0.83 0.75  CALCIUM 9.5 8.1*   Liver Function Tests:  Recent Labs Lab 12/11/13 2130  AST 18  ALT 11  ALKPHOS 55  BILITOT 0.4  PROT 7.3  ALBUMIN 3.7   No results found for this basename: LIPASE, AMYLASE,  in the last 168 hours No results found for this basename: AMMONIA,  in the last 168 hours CBC:  Recent Labs Lab 12/11/13 2300  WBC 8.0  HGB 11.6*  HCT 34.0*  MCV 88.5  PLT 224   Cardiac Enzymes: No  results found for this basename: CKTOTAL, CKMB, CKMBINDEX, TROPONINI,  in the last 168 hours BNP (last 3 results) No results found for this basename: PROBNP,  in the last 8760 hours CBG: No results found for this basename: GLUCAP,  in the last 168 hours  Recent Results (from the past 240 hour(s))  CLOSTRIDIUM DIFFICILE BY PCR     Status: None   Collection Time    12/12/13  6:09 AM      Result Value Ref Range Status   C difficile by pcr NEGATIVE  NEGATIVE Final   Comment: Performed at [facility]     Studies: No results found.  Scheduled Meds: . ciprofloxacin  500 mg Oral BID  . citalopram  40 mg Oral q morning - 10a  . cloNIDine  0.1 mg Oral QHS  . enoxaparin (LOVENOX) injection  50 mg Subcutaneous Daily  . feeding supplement (RESOURCE BREEZE)  1 Container Oral BID BM  . labetalol  100 mg Oral TID  . metroNIDAZOLE  500 mg Oral 3 times per day  .  saccharomyces boulardii  250 mg Oral BID  . simvastatin  10 mg Oral q1800  . traZODone  300 mg Oral QHS   Continuous Infusions:    Principal Problem:   Colitis, acute Active Problems:   OBESITY   DEPRESSION   HYPERTENSION   OSTEOARTHRITIS   Colitis   Dehydration   Hypokalemia   Mild protein-calorie malnutrition    Time spent: 30 min    Rachel [name]  [facility] Pager [phone]. If 7PM-7AM, please contact night-coverage at [email or web address], password [facility] 12/15/2013, 8:44 AM  LOS: 4 days

## 2013-12-15 NOTE — Progress Notes (Signed)
Unassigned patient Subjective: Since I last evaluated the patient, she seems to be doing well and has had only 1 BM today. Findings on colonoscopy and EGD noted. She is awaiting discharge tomorrow morning.   Objective: Vital signs in last 24 hours: Temp:  [98.1 F (36.7 C)-99.5 F (37.5 C)] 98.1 F (36.7 C) (02/18 1321) Pulse Rate:  [70-82] 70 (02/18 1321) Resp:  [16-18] 18 (02/18 1321) BP: (114-125)/(58-74) 125/74 mmHg (02/18 1321) SpO2:  [94 %-98 %] 98 % (02/18 1321) Last BM Date: 12/14/13  Intake/Output from previous day: 02/17 0701 - 02/18 0700 In: 780 [P.O.:480; IV Piggyback:300] Out: -  Intake/Output this shift:   General appearance: alert, cooperative, appears stated age, no distress and moderately obese Resp: clear to auscultation bilaterally Cardio: regular rate and rhythm, S1, S2 normal, no murmur, click, rub or gallop GI: soft, obese, non-tender; bowel sounds normal; no masses,  no organomegaly Extremities: extremities normal, atraumatic, no cyanosis or edema  BMET  Recent Labs  12/13/13 0505 12/15/13 1100  NA 140 140  K 3.9 4.1  CL 106 104  CO2 24 25  GLUCOSE 102* 126*  BUN 3* 8  CREATININE 0.75 0.93  CALCIUM 8.1* 9.2   Medications: I have reviewed the patient's current medications.  Assessment/Plan: 1) Acute colitis on CT: biopsies done on colonoscopy-results pending. We will call her with the pathology results and she can follow up with Dr. Barney Drain on a OP basis. Avoidance of NSAIDS is encouraged.   LOS: 4 days   Soren Lazarz 12/15/2013, 4:41 PM

## 2013-12-16 MED ORDER — METRONIDAZOLE 500 MG PO TABS: 500.0000 mg | ORAL_TABLET | Freq: Three times a day (TID) | ORAL | Status: DC

## 2013-12-16 MED ORDER — SACCHAROMYCES BOULARDII 250 MG PO CAPS: 250.0000 mg | ORAL_CAPSULE | Freq: Two times a day (BID) | ORAL | Status: DC

## 2013-12-16 MED ORDER — CIPROFLOXACIN HCL 500 MG PO TABS: 500.0000 mg | ORAL_TABLET | Freq: Two times a day (BID) | ORAL | Status: DC

## 2013-12-16 NOTE — Progress Notes (Signed)
Patient given discharge instructions, with a verbalized understanding of all discharge instructions.  Paper prescriptions were given to patient, and were called into patient's pharmacy.  Patient agreeable to discharge plan, and was in stable medical condition at the time of discharge.  Durwin Nora RN

## 2013-12-16 NOTE — Discharge Summary (Signed)
Physician Discharge Summary  Rachel [name] [id number] DOB: 1956/05/22 DOA: 12/11/2013  PCP: Maggie Font, MD  Admit date: 12/11/2013 Discharge date: 12/16/2013  Time spent: 35 minutes  Recommendations for Outpatient Follow-up:  1. Need to follow up with Primary gastroenterologist.   Discharge Diagnoses:    Colitis, acute   OBESITY   DEPRESSION   HYPERTENSION   OSTEOARTHRITIS   Colitis   Dehydration   Hypokalemia   Mild protein-calorie malnutrition   Discharge Condition: Stable.   Diet recommendation: Heart Healthy.  Filed Weights   12/12/13 0042  Weight: 104.1 kg (229 lb 8 oz)    History of present illness:  58 year old obese female with history of hypertension, dyslipidemia, depression and osteoarthritis who was admitted to and been hospital one month back for C. difficile colitis. Patient initially presented to the ED with abdominal pain diarrhea and was found to have diffuse colitis on CT scan and discharged home on ciprofloxacin and Flagyl. She returned back to days later with persistent diarrhea and had stool for C. difficile positive along with HCAP positive influenza. Patient was started on oral vancomycin and discharged home with a 2 weeks course of antibiotics. Patient was seen by GI during the hospital stay. Patient has had a colonoscopy in 2011 with finding of tubular adenoma. No personal or family history of Crohn's disease.  As the patient diarrhea has not resolved since her discharge and has about 5 episodes of diarrhea (both watery and formed, brownish to greenish in color without any mucus or blood) . For possible weeks she has been having crampy abdominal pain mainly over the right lower quadrant. For past one week her diarrhea has worsened and she has 6-7 episodes of diarrhea and now has been getting up in the night as well. She had subjective fevers 2 days back but denies any rash or joint pains. Denies any visual symptoms. She called her PCP 2 days back  and was prescribed a course of oral Flagyl.  Since yesterday she has been nauseous and has had 2 episodes of vomiting as well.  Patient denies headache, dizziness, chills, chest pain, palpitations, SOB, or urinary symptoms. Reports poor appetite.   Hospital Course:  Colitis, acute of the cecum and ascending colon. Treated last month for C. Diff colitis, now C. Diff negative. Increased suspicion for IBD vs. Recurrent infection. Ischemia less likely given distribution. Profuse stools last night due to clean-out for colo today.  - C. Diff negative  - GI pathogen panel neg  - Continue cipro and flagyl day 4. Will provide a total of 7 days antibiotics.  - Continue florastor  - Dr. Benson Norway performed EGD and colo: Mild colitis, bx taken.  - F/u biopsy report  - Pt to follow up with Dr. Oneida Alar as outpatient  -no significant diarrhea.  Biopsy results;  -1. Small Intestine Biopsy, small bowel - BENIGN SMALL BOWEL MUCOSA. NO VILLOUS ATROPHY, INFLAMMATION OR OTHER ABNORMALITIES PRESENT. 2. Colon, biopsy, ascending and cecum - BENIGN COLONIC MUCOSA WITH INTRAMUCOSAL LYMPHOID AGGREGATES. - NO EVIDENCE OF ACTIVE INFLAMMATION OR NEOPLASM.  Hypertension BP stable.  Continueclonidine  - Continue labetalol  - Hold Norvasc SBP 110 range,   Hyperlipidemia  Continue statin   Depression  Continue trazodone and Celexa   Hypokalemia due to GI losses, resolved with potassium in IVF.  Mild protein-calorie malnutrition  - Appreciate nutrition recommendations  - supplements   Procedures: Colonoscopy; 12-14-2013 1) Mild colitis s/p biopsies.  2) Int/Ext Hemorrhoids.  Normal EGD 12-14-2013  Consultations:  Dr Benson Norway  Discharge Exam: Filed Vitals:   12/16/13 0554  BP: 126/71  Pulse: 73  Temp: 98.1 F (36.7 C)  Resp: 18    General: No distress.  Cardiovascular: S 1, S 2 RRR Respiratory: CTA Abdomen; soft, NT  Discharge Instructions  Discharge Orders   Future Orders Complete By  Expires   Diet - low sodium heart healthy  As directed    Increase activity slowly  As directed        Medication List         ALPRAZolam 1 MG tablet  Commonly known as:  XANAX  Take 1 mg by mouth 3 (three) times daily as needed for sleep or anxiety.     amLODipine 10 MG tablet  Commonly known as:  NORVASC  Take 10 mg by mouth every morning.     ciprofloxacin 500 MG tablet  Commonly known as:  CIPRO  Take 1 tablet (500 mg total) by mouth 2 (two) times daily.     citalopram 40 MG tablet  Commonly known as:  CELEXA  Take 40 mg by mouth every morning.     cloNIDine 0.1 MG tablet  Commonly known as:  CATAPRES  Take 0.1 mg by mouth every evening.     HYDROcodone-acetaminophen 10-325 MG per tablet  Commonly known as:  NORCO  Take 1 tablet by mouth every 4 (four) hours as needed. pain     metroNIDAZOLE 500 MG tablet  Commonly known as:  FLAGYL  Take 1 tablet (500 mg total) by mouth every 8 (eight) hours.     pravastatin 20 MG tablet  Commonly known as:  PRAVACHOL  Take 20 mg by mouth every evening. Recheck lipids with new MD in 3 months.     saccharomyces boulardii 250 MG capsule  Commonly known as:  FLORASTOR  Take 1 capsule (250 mg total) by mouth 2 (two) times daily.     trazodone 300 MG tablet  Commonly known as:  DESYREL  Take 300 mg by mouth at bedtime.       Allergies  Allergen Reactions  . Lisinopril     REACTION: Cough  . Prednisone     REACTION: sick on the stomach  . Betadine [Povidone Iodine] Rash       Follow-up Information   Follow up with HILL,GERALD K, MD In 1 week.   Specialty:  Family Medicine   Contact information:   [address] [place] [address] [phone]        The results of significant diagnostics from this hospitalization (including imaging, microbiology, ancillary and laboratory) are listed below for reference.    Significant Diagnostic Studies: Ct Abdomen Pelvis W Contrast  12/11/2013   CLINICAL DATA:   Lower abdominal pain  EXAM: CT ABDOMEN AND PELVIS WITH CONTRAST  TECHNIQUE: Multidetector CT imaging of the abdomen and pelvis was performed using the standard protocol following bolus administration of intravenous contrast.  CONTRAST:  145mL OMNIPAQUE IOHEXOL 300 MG/ML  SOLN  COMPARISON:  Recent prior CT abdomen/ pelvis 11/04/2013  FINDINGS: Lower Chest: Mild lower lobe it dependent atelectasis. Otherwise, the lung bases are clear. Cardiomegaly. Oral contrast material is noted within the distal thoracic esophagus suggesting reflux.  Abdomen: Unremarkable CT appearance of the stomach, spleen, adrenal glands and pancreas. Normal hepatic contour and morphology. No discrete hepatic lesion. Gallbladder is unremarkable. No intra or extrahepatic biliary ductal dilatation.  Unremarkable appearance of the bilateral kidneys. No focal solid lesion, hydronephrosis or  nephrolithiasis.  Abnormal submucosal edema of the cecum and ascending colon. Inflammatory stranding is noted in the pericolonic fat and ascending colonic mesial colon. Additionally, there are mildly prominent mesial colonic lymph nodes. The largest measures 8 mm in short axis on image 39 of series 2. Normal appendix identified in the right lower quadrant. There is also a short segment of abnormal transverse (D3) duodenum. There is haziness in blurring of the mildly thickened bowel wall with some adjacent very duodenum inflammatory stranding. The terminal ileum is unremarkable. The remainder of the colon is unremarkable. No significant diverticulosis. No evidence of bowel obstruction. No free air or free fluid. Mild thickening of the right anterior para renal fascia adjacent to the inflamed ascending colon.  Pelvis: Round low-attenuation 2.3 x 2.4 cm structure in uterine fundus is favored to reflect a degenerated uterine fibroid. No free fluid. Slightly limited evaluation secondary to streak artifact from the right hip prosthesis. The bladder appears  unremarkable.  Bones/Soft Tissues: No acute fracture or aggressive appearing lytic or blastic osseous lesion. A multilevel degenerative spurring throughout the thoracic spine. Lower lumbar degenerative disc disease and facet arthropathy. Right hip arthroplasty incompletely imaged. No evidence of complication in the imaged portion. Surgical changes of prior left femoral repair with intra medullary nail are also incompletely imaged.  Vascular: Trace atherosclerotic calcifications the infrarenal abdominal aorta. No significant stenosis or aneurysmal dilatation.  IMPRESSION: 1. Cecal and ascending colitis without evidence of abscess or perforation. Both infectious and inflammatory etiologies are differential considerations. Given the distribution, ischemia is considerably less likely. Inflammatory changes in the horizontal (D3) segment of the duodenum are favored to be reactive and related to the underlying colonic process. Alternately, a skip region of bowel inflammation is a possibility if the patient has a clinical history of Crohn's disease. 2. Borderline enlarged adenopathy in the ascending colonic mesocolon favored to be reactive. 3. Round low-attenuation structure in the uterine fundus is favored to reflect a degenerated uterine fibroid. 4. Oral contrast material in the thoracic esophagus suggests reflux.   Electronically Signed   By: Jacqulynn Cadet M.D.   On: 12/11/2013 22:53    Microbiology: Recent Results (from the past 240 hour(s))  CLOSTRIDIUM DIFFICILE BY PCR     Status: None   Collection Time    12/12/13  6:09 AM      Result Value Ref Range Status   C difficile by pcr NEGATIVE  NEGATIVE Final   Comment: Performed at [facility]: Basic Metabolic Panel:  Recent Labs Lab 12/11/13 2130 12/13/13 0505 12/15/13 1100  NA 138 140 140  K 3.1* 3.9 4.1  CL 99 106 104  CO2 22 24 25   GLUCOSE 126* 102* 126*  BUN 7 3* 8  CREATININE 0.83 0.75 0.93  CALCIUM 9.5 8.1* 9.2    Liver Function Tests:  Recent Labs Lab 12/11/13 2130  AST 18  ALT 11  ALKPHOS 55  BILITOT 0.4  PROT 7.3  ALBUMIN 3.7   No results found for this basename: LIPASE, AMYLASE,  in the last 168 hours No results found for this basename: AMMONIA,  in the last 168 hours CBC:  Recent Labs Lab 12/11/13 2300  WBC 8.0  HGB 11.6*  HCT 34.0*  MCV 88.5  PLT 224   Cardiac Enzymes: No results found for this basename: CKTOTAL, CKMB, CKMBINDEX, TROPONINI,  in the last 168 hours BNP: BNP (last 3 results) No results found for this basename: PROBNP,  in the last  8760 hours CBG: No results found for this basename: GLUCAP,  in the last 168 hours     Signed:  Violette Morneault  [facility] 12/16/2013, 8:43 AM

## 2014-02-10 ENCOUNTER — Encounter: Payer: Self-pay | Admitting: Orthopedic Surgery

## 2014-02-10 ENCOUNTER — Ambulatory Visit (INDEPENDENT_AMBULATORY_CARE_PROVIDER_SITE_OTHER): Payer: Medicaid Other | Admitting: Orthopedic Surgery

## 2014-02-10 VITALS — BP 101/60 | Ht 64.0 in | Wt 229.0 lb

## 2014-02-10 DIAGNOSIS — IMO0002 Reserved for concepts with insufficient information to code with codable children: Secondary | ICD-10-CM

## 2014-02-10 DIAGNOSIS — M1711 Unilateral primary osteoarthritis, right knee: Secondary | ICD-10-CM

## 2014-02-10 DIAGNOSIS — M1712 Unilateral primary osteoarthritis, left knee: Secondary | ICD-10-CM

## 2014-02-10 DIAGNOSIS — M1710 Unilateral primary osteoarthritis, unspecified knee: Secondary | ICD-10-CM

## 2014-02-10 NOTE — Progress Notes (Signed)
Patient ID: HOLIDAY MCMENAMIN, female   DOB: November 27, 1955, 58 y.o.   MRN: [id number]  Chief Complaint  Patient presents with  . Follow-up    Recheck on bilateral knees with injections.    Knee  Injection Procedure Note  Pre-operative Diagnosis: left knee oa  Post-operative Diagnosis: same  Indications: pain  Anesthesia: ethyl chloride   Procedure Details   Verbal consent was obtained for the procedure. Time out was completed.The joint was prepped with alcohol, followed by  Ethyl chloride spray and A 20 gauge needle was inserted into the knee via lateral approach; 31ml 1% lidocaine and 1 ml of depomedrol  was then injected into the joint . The needle was removed and the area cleansed and dressed.  Complications:  None; patient tolerated the procedure well. Knee  Injection Procedure Note  Pre-operative Diagnosis: right knee oa  Post-operative Diagnosis: same  Indications: pain  Anesthesia: ethyl chloride   Procedure Details   Verbal consent was obtained for the procedure. Time out was completed.The joint was prepped with alcohol, followed by  Ethyl chloride spray and A 20 gauge needle was inserted into the knee via lateral approach; 81ml 1% lidocaine and 1 ml of depomedrol  was then injected into the joint . The needle was removed and the area cleansed and dressed.  Complications:  None; patient tolerated the procedure well.   Encounter Diagnoses  Name Primary?  Marland Kitchen Arthritis of knee, right Yes  . Osteoarthritis of left knee     Call or return to clinic prn if these symptoms worsen or fail to improve as anticipated.

## 2014-02-10 NOTE — Patient Instructions (Signed)
You have received a steroid shot. 15% of patients experience increased pain at the injection site with in the next 24 hours. This is best treated with ice and tylenol extra strength 2 tabs every 8 hours. If you are still having pain please call the office.    

## 2014-02-23 ENCOUNTER — Encounter: Payer: Self-pay | Admitting: Podiatrist

## 2014-02-23 ENCOUNTER — Ambulatory Visit (INDEPENDENT_AMBULATORY_CARE_PROVIDER_SITE_OTHER): Payer: Medicaid Other | Admitting: Podiatrist

## 2014-02-23 VITALS — BP 123/63 | HR 66 | Resp 16

## 2014-02-23 DIAGNOSIS — M7150 Other bursitis, not elsewhere classified, unspecified site: Secondary | ICD-10-CM

## 2014-02-23 DIAGNOSIS — M21619 Bunion of unspecified foot: Secondary | ICD-10-CM

## 2014-02-23 DIAGNOSIS — M7750 Other enthesopathy of unspecified foot: Secondary | ICD-10-CM

## 2014-02-23 NOTE — Progress Notes (Signed)
Subjective: Rachel [name] presents today for pain lateral proximal side of the right foot at the calcaneocuboid articulation. She states that the area that I did surgery which is the fifth metatarsal head and shaft region is no longer painful. Overall she's pleased with the final result. She also has pain at the head of the left fifth metatarsal and wonders if she may need surgery here in the future. In the past I have cautioned against surgery as it never seem to help her right foot however now she states she believes it was beneficial in the long run.  Objective: Neurovascular status is intact. She has a C. shape varus foot type with pressure placed on the lateral portion of the foot upon ambulation. She has pain along the calcaneal cuboid joint right foot. She also has pain along the fifth metatarsal head left foot with pressure. A mild tailors bunion deformity is present left. Dorsal exostosis present left foot and right foot  Assessment: Bursitis/capsulitis calcaneal cuboid joint right and fifth metatarsal head left  Plan: Injected these areas under sterile technique with Kenalog and Marcaine mixture. The patient tolerated this well. I discussed a procedure to simply shaved down the bony prominence on the fifth metatarsal head of the left foot. The patient is interested in this at the end of the summer. She also has an exostosis dorsally on the left foot that we could address at that time as well. She will be seen back in August and we can discuss a surgical plan further at that time. If the injections are not beneficial she is instructed to call.

## 2014-02-23 NOTE — Patient Instructions (Signed)
Call if the injections don't help the foot pain.  We can plan to shave off the boney prominence on the left foot-- we will talk again in august to see if you are still interested at that time.

## 2014-02-24 ENCOUNTER — Ambulatory Visit (INDEPENDENT_AMBULATORY_CARE_PROVIDER_SITE_OTHER): Payer: Medicaid Other | Admitting: Otolaryngology

## 2014-04-05 ENCOUNTER — Other Ambulatory Visit (HOSPITAL_COMMUNITY): Payer: Self-pay | Admitting: Family Medicine

## 2014-04-05 DIAGNOSIS — Z1231 Encounter for screening mammogram for malignant neoplasm of breast: Secondary | ICD-10-CM

## 2014-04-11 ENCOUNTER — Ambulatory Visit (HOSPITAL_COMMUNITY)
Admission: RE | Admit: 2014-04-11 | Discharge: 2014-04-11 | Disposition: A | Discharge status: Home / Self Care | Payer: Medicaid Other | Source: Ambulatory Visit | Attending: Family Medicine | Admitting: Family Medicine

## 2014-04-11 DIAGNOSIS — Z1231 Encounter for screening mammogram for malignant neoplasm of breast: Secondary | ICD-10-CM | POA: Diagnosis present

## 2014-04-25 ENCOUNTER — Telehealth: Payer: Self-pay | Admitting: Orthopedic Surgery

## 2014-04-25 NOTE — Telephone Encounter (Signed)
3 months in between injections She can be scheduled 05/12/14 or after.

## 2014-04-25 NOTE — Telephone Encounter (Signed)
Patient called wanting to know how far apart knee injections needed to be. She had her last one on 02/10/2014. Wants to schedule an appointment. Patient phone is [phone]

## 2014-05-15 ENCOUNTER — Emergency Department (HOSPITAL_COMMUNITY): Payer: Medicaid Other

## 2014-05-15 ENCOUNTER — Emergency Department (HOSPITAL_COMMUNITY)
Admission: EM | Admit: 2014-05-15 | Discharge: 2014-05-15 | Disposition: A | Discharge status: Home / Self Care | Payer: Medicaid Other | Attending: Emergency Medicine | Admitting: Emergency Medicine

## 2014-05-15 ENCOUNTER — Encounter (HOSPITAL_COMMUNITY): Payer: Self-pay | Admitting: Emergency Medicine

## 2014-05-15 DIAGNOSIS — E785 Hyperlipidemia, unspecified: Secondary | ICD-10-CM | POA: Diagnosis not present

## 2014-05-15 DIAGNOSIS — E669 Obesity, unspecified: Secondary | ICD-10-CM | POA: Diagnosis not present

## 2014-05-15 DIAGNOSIS — M7580 Other shoulder lesions, unspecified shoulder: Secondary | ICD-10-CM

## 2014-05-15 DIAGNOSIS — T148XXA Other injury of unspecified body region, initial encounter: Secondary | ICD-10-CM

## 2014-05-15 DIAGNOSIS — M7981 Nontraumatic hematoma of soft tissue: Secondary | ICD-10-CM | POA: Insufficient documentation

## 2014-05-15 DIAGNOSIS — F329 Major depressive disorder, single episode, unspecified: Secondary | ICD-10-CM | POA: Diagnosis not present

## 2014-05-15 DIAGNOSIS — I10 Essential (primary) hypertension: Secondary | ICD-10-CM | POA: Diagnosis not present

## 2014-05-15 DIAGNOSIS — F3289 Other specified depressive episodes: Secondary | ICD-10-CM | POA: Insufficient documentation

## 2014-05-15 DIAGNOSIS — M25819 Other specified joint disorders, unspecified shoulder: Secondary | ICD-10-CM | POA: Insufficient documentation

## 2014-05-15 DIAGNOSIS — Z79899 Other long term (current) drug therapy: Secondary | ICD-10-CM | POA: Diagnosis not present

## 2014-05-15 DIAGNOSIS — M1990 Unspecified osteoarthritis, unspecified site: Secondary | ICD-10-CM | POA: Insufficient documentation

## 2014-05-15 DIAGNOSIS — Z8781 Personal history of (healed) traumatic fracture: Secondary | ICD-10-CM | POA: Diagnosis not present

## 2014-05-15 DIAGNOSIS — M79609 Pain in unspecified limb: Secondary | ICD-10-CM | POA: Diagnosis present

## 2014-05-15 DIAGNOSIS — F411 Generalized anxiety disorder: Secondary | ICD-10-CM | POA: Diagnosis not present

## 2014-05-15 MED ORDER — OXYCODONE-ACETAMINOPHEN 5-325 MG PO TABS: 1.0000 | ORAL_TABLET | ORAL | Status: DC | PRN

## 2014-05-15 MED ORDER — HYDROCODONE-ACETAMINOPHEN 5-325 MG PO TABS
1.0000 | ORAL_TABLET | Freq: Once | ORAL | Status: AC
  Administered 2014-05-15: 1 via ORAL
  Filled 2014-05-15: qty 1

## 2014-05-15 MED ORDER — HYDROMORPHONE HCL PF 1 MG/ML IJ SOLN
1.0000 mg | Freq: Once | INTRAMUSCULAR | Status: AC
  Administered 2014-05-15: 1 mg via INTRAMUSCULAR
  Filled 2014-05-15: qty 1

## 2014-05-15 NOTE — ED Notes (Signed)
Pt states she woke up yesterday with pain and swelling in her left lower leg.pt has swelling and bruise

## 2014-05-15 NOTE — ED Provider Notes (Signed)
Medical screening examination/treatment/procedure(s) were performed by non-physician practitioner and as supervising physician I was immediately available for consultation/collaboration.  Richarda Blade, MD 05/15/14 2300
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
## 2014-05-15 NOTE — Discharge Instructions (Signed)
Contusion A contusion is a deep bruise. Contusions are the result of an injury that caused bleeding under the skin. The contusion may turn blue, purple, or yellow. Minor injuries will give you a painless contusion, but more severe contusions may stay painful and swollen for a few weeks.  CAUSES  A contusion is usually caused by a blow, trauma, or direct force to an area of the body. SYMPTOMS   Swelling and redness of the injured area.  Bruising of the injured area.  Tenderness and soreness of the injured area.  Pain. DIAGNOSIS  The diagnosis can be made by taking a history and physical exam. An X-ray, CT scan, or MRI may be needed to determine if there were any associated injuries, such as fractures. TREATMENT  Specific treatment will depend on what area of the body was injured. In general, the best treatment for a contusion is resting, icing, elevating, and applying cold compresses to the injured area. Over-the-counter medicines may also be recommended for pain control. Ask your caregiver what the best treatment is for your contusion. HOME CARE INSTRUCTIONS   Put ice on the injured area.  Put ice in a plastic bag.  Place a towel between your skin and the bag.  Leave the ice on for 15-20 minutes, 3-4 times a day, or as directed by your health care provider.  Only take over-the-counter or prescription medicines for pain, discomfort, or fever as directed by your caregiver. Your caregiver may recommend avoiding anti-inflammatory medicines (aspirin, ibuprofen, and naproxen) for 48 hours because these medicines may increase bruising.  Rest the injured area.  If possible, elevate the injured area to reduce swelling. SEEK IMMEDIATE MEDICAL CARE IF:   You have increased bruising or swelling.  You have pain that is getting worse.  Your swelling or pain is not relieved with medicines. MAKE SURE YOU:   Understand these instructions.  Will watch your condition.  Will get help right  away if you are not doing well or get worse. Document Released: 07/24/2005 Document Revised: 10/19/2013 Document Reviewed: 08/19/2011 [facility] Patient Information 2015 [place], [place]. This information is not intended to replace advice given to you by your health care provider. Make sure you discuss any questions you have with your health care provider.   Use ice as directed above.  You may add a heating pad 20 minutes several times daily starting on Tuesday.  You may take the oxycodone prescribed for pain relief.  This will make you drowsy - do not drive within 4 hours of taking this medication.

## 2014-05-18 ENCOUNTER — Ambulatory Visit: Payer: Medicaid Other | Admitting: Podiatrist

## 2014-05-23 ENCOUNTER — Other Ambulatory Visit: Payer: Medicaid Other | Admitting: Adult Health

## 2014-05-25 ENCOUNTER — Ambulatory Visit (INDEPENDENT_AMBULATORY_CARE_PROVIDER_SITE_OTHER): Payer: Medicaid Other | Admitting: Podiatrist

## 2014-05-25 ENCOUNTER — Encounter: Payer: Self-pay | Admitting: Podiatrist

## 2014-05-25 VITALS — BP 117/50 | HR 72 | Resp 16

## 2014-05-25 DIAGNOSIS — M779 Enthesopathy, unspecified: Principal | ICD-10-CM

## 2014-05-25 DIAGNOSIS — M778 Other enthesopathies, not elsewhere classified: Secondary | ICD-10-CM

## 2014-05-25 DIAGNOSIS — M7660 Achilles tendinitis, unspecified leg: Secondary | ICD-10-CM

## 2014-05-25 MED ORDER — HYDROCODONE-ACETAMINOPHEN 5-325 MG PO TABS: 1.0000 | ORAL_TABLET | ORAL | Status: DC | PRN

## 2014-05-25 NOTE — Progress Notes (Signed)
Subjective: Patient presents today for followup of foot pain bilateral. She states she still has pain at the base of the fifth metatarsal right and head of the fifth metatarsal left. She relates the injections at the last visit were very beneficial and she would like to see if she get another injection at this time and she is considering surgery.  Objective: Neurovascular status is intact. She has a C. shape varus foot type with pressure placed on the lateral portion of the foot upon ambulation. She has pain along the fifth metatarsal base and calcaneal cuboid joint right foot. She also has pain along the fifth metatarsal head left foot with pressure. A mild tailors bunion deformity is present left. Dorsal exostosis present left foot and right foot   Assessment: Bursitis/capsulitis fifth metatarsal base and calcaneal cuboid joint right and fifth metatarsal head left   Plan: Injected these areas under sterile technique with Kenalog and Marcaine mixture. The patient tolerated this well. I discussed a procedure to simply shaved down the bony prominence on the fifth metatarsal head of the left foot. The patient is interested in this at the end of the summer. She also has an exostosis dorsally on the left foot that we could address at that time as well. She will be seen back in late August and we can discuss a surgical plan further at that time. If the injections are not beneficial she is instructed to call.

## 2014-06-06 ENCOUNTER — Encounter: Payer: Self-pay | Admitting: Adult Health

## 2014-06-06 ENCOUNTER — Ambulatory Visit (INDEPENDENT_AMBULATORY_CARE_PROVIDER_SITE_OTHER): Payer: Medicaid Other | Admitting: Adult Health

## 2014-06-06 VITALS — BP 150/70 | HR 76 | Ht 61.25 in | Wt 230.0 lb

## 2014-06-06 DIAGNOSIS — Z1212 Encounter for screening for malignant neoplasm of rectum: Secondary | ICD-10-CM

## 2014-06-06 DIAGNOSIS — Z01419 Encounter for gynecological examination (general) (routine) without abnormal findings: Secondary | ICD-10-CM

## 2014-06-06 DIAGNOSIS — Z0000 Encounter for general adult medical examination without abnormal findings: Secondary | ICD-10-CM

## 2014-06-06 LAB — HEMOCCULT GUIAC POC 1CARD (OFFICE): FECAL OCCULT BLD: NEGATIVE

## 2014-06-06 NOTE — Patient Instructions (Signed)
Physical in 1 year Mammogram yearly Labs with PCP 

## 2014-06-06 NOTE — Progress Notes (Signed)
Patient ID: MCKENLEIGH TARLTON, female   DOB: [birth year], 58 y.o.   MRN: [id number] History of Present Illness: Shreya is a 58 year old black female, in for a physical.She had a normal pap with negative HPV,04/2413.Had colitis earlier this year and had colonoscopy.Has lost some weight since then.   Current Medications, Allergies, Past Medical History, Past Surgical History, Family History and Social History were reviewed in [employer] record.     Review of Systems: Patient denies any daily headaches, blurred vision, shortness of breath, chest pain, abdominal pain, problems with bowel movements, urination, or intercourse. No mood swings, she has pain left knee and sees Dr Aline Brochure, she is using a cane.    Physical Exam:BP 150/70  Pulse 76  Ht 5' 1.25" (1.556 m)  Wt 230 lb (104.327 kg)  BMI 43.09 kg/m2 General:  Well developed, well nourished, no acute distress Skin:  Warm and dry Neck:  Midline trachea, normal thyroid Lungs; Clear to auscultation bilaterally Breast:  No dominant palpable mass, retraction, or nipple discharge Cardiovascular: Regular rate and rhythm Abdomen:  Soft, non tender, no hepatosplenomegaly Pelvic:  External genitalia is normal in appearance, the vagina has good color, moisture and rugae.The cervix is smooth. Uterus is felt to be normal size, shape, and contour.  No adnexal masses or tenderness noted. Rectal: Good sphincter tone, no polyps,some hemorrhoids felt.  Hemoccult negative. Extremities:  No swelling or varicosities noted Psych:  No mood changes,alert and cooperative,seems happy   Impression: Yearly gyn exam no pap    Plan: Physical in 1 year Mammogram yearly  Labs with Dr Berdine Addison

## 2014-06-07 ENCOUNTER — Ambulatory Visit (INDEPENDENT_AMBULATORY_CARE_PROVIDER_SITE_OTHER): Payer: Medicaid Other | Admitting: Orthopedic Surgery

## 2014-06-07 VITALS — BP 139/62 | Ht 64.0 in | Wt 230.0 lb

## 2014-06-07 DIAGNOSIS — M1610 Unilateral primary osteoarthritis, unspecified hip: Secondary | ICD-10-CM

## 2014-06-07 DIAGNOSIS — M169 Osteoarthritis of hip, unspecified: Secondary | ICD-10-CM

## 2014-06-07 NOTE — Patient Instructions (Signed)
Joint Injection  Care After  Refer to this sheet in the next few days. These instructions provide you with information on caring for yourself after you have had a joint injection. Your caregiver also may give you more specific instructions. Your treatment has been planned according to current medical practices, but problems sometimes occur. Call your caregiver if you have any problems or questions after your procedure.  After any type of joint injection, it is not uncommon to experience:  · Soreness, swelling, or bruising around the injection site.  · Mild numbness, tingling, or weakness around the injection site caused by the numbing medicine used before or with the injection.  It also is possible to experience the following effects associated with the specific agent after injection:  · Iodine-based contrast agents:  ¨ Allergic reaction (itching, hives, widespread redness, and swelling beyond the injection site).  · Corticosteroids (These effects are rare.):  ¨ Allergic reaction.  ¨ Increased blood sugar levels (If you have diabetes and you notice that your blood sugar levels have increased, notify your caregiver).  ¨ Increased blood pressure levels.  ¨ Mood swings.  · Hyaluronic acid in the use of viscosupplementation.  ¨ Temporary heat or redness.  ¨ Temporary rash and itching.  ¨ Increased fluid accumulation in the injected joint.  These effects all should resolve within a day after your procedure.   HOME CARE INSTRUCTIONS  · Limit yourself to light activity the day of your procedure. Avoid lifting heavy objects, bending, stooping, or twisting.  · Take prescription or over-the-counter pain medication as directed by your caregiver.  · You may apply ice to your injection site to reduce pain and swelling the day of your procedure. Ice may be applied 03-04 times:  ¨ Put ice in a plastic bag.  ¨ Place a towel between your skin and the bag.  ¨ Leave the ice on for no longer than 15-20 minutes each time.  SEEK  IMMEDIATE MEDICAL CARE IF:   · Pain and swelling get worse rather than better or extend beyond the injection site.  · Numbness does not go away.  · Blood or fluid continues to leak from the injection site.  · You have chest pain.  · You have swelling of your face or tongue.  · You have trouble breathing or you become dizzy.  · You develop a fever, chills, or severe tenderness at the injection site that last longer than 1 day.  MAKE SURE YOU:  · Understand these instructions.  · Watch your condition.  · Get help right away if you are not doing well or if you get worse.  Document Released: 06/27/2011 Document Revised: 01/06/2012 Document Reviewed: 06/27/2011  [employer]® Patient Information ©2015 [employer]. This information is not intended to replace advice given to you by your health care provider. Make sure you discuss any questions you have with your health care provider.

## 2014-06-07 NOTE — Progress Notes (Signed)
follow up visit  Chief Complaint  Patient presents with  . Follow-up    follow up bilateral knees, repeat injections    BP 139/62  Ht 5\' 4"  (1.626 m)  Wt 230 lb (104.327 kg)  BMI 39.46 kg/m2  Knee  Injection Procedure Note  Pre-operative Diagnosis: left knee oa  Post-operative Diagnosis: same  Indications: pain  Anesthesia: ethyl chloride   Procedure Details   Verbal consent was obtained for the procedure. Time out was completed.The joint was prepped with alcohol, followed by  Ethyl chloride spray and A 20 gauge needle was inserted into the knee via lateral approach; 16ml 1% lidocaine and 1 ml of depomedrol  was then injected into the joint . The needle was removed and the area cleansed and dressed.  Complications:  None; patient tolerated the procedure well. Knee  Injection Procedure Note  Pre-operative Diagnosis: right knee oa  Post-operative Diagnosis: same  Indications: pain  Anesthesia: ethyl chloride   Procedure Details   Verbal consent was obtained for the procedure. Time out was completed.The joint was prepped with alcohol, followed by  Ethyl chloride spray and A 20 gauge needle was inserted into the knee via lateral approach; 33ml 1% lidocaine and 1 ml of depomedrol  was then injected into the joint . The needle was removed and the area cleansed and dressed.  Complications:  None; patient tolerated the procedure well.

## 2014-06-22 ENCOUNTER — Ambulatory Visit (INDEPENDENT_AMBULATORY_CARE_PROVIDER_SITE_OTHER): Payer: Medicaid Other

## 2014-06-22 ENCOUNTER — Ambulatory Visit (INDEPENDENT_AMBULATORY_CARE_PROVIDER_SITE_OTHER): Payer: Medicaid Other | Admitting: Podiatrist

## 2014-06-22 ENCOUNTER — Encounter: Payer: Self-pay | Admitting: Podiatrist

## 2014-06-22 VITALS — BP 149/69 | HR 65 | Resp 18

## 2014-06-22 DIAGNOSIS — M21622 Bunionette of left foot: Secondary | ICD-10-CM

## 2014-06-22 DIAGNOSIS — R52 Pain, unspecified: Secondary | ICD-10-CM

## 2014-06-22 DIAGNOSIS — M21619 Bunion of unspecified foot: Secondary | ICD-10-CM

## 2014-06-22 DIAGNOSIS — M7752 Other enthesopathy of left foot: Secondary | ICD-10-CM

## 2014-06-22 DIAGNOSIS — M898X9 Other specified disorders of bone, unspecified site: Secondary | ICD-10-CM

## 2014-06-22 MED ORDER — OXYCODONE-ACETAMINOPHEN 5-325 MG PO TABS: 1.0000 | ORAL_TABLET | Freq: Four times a day (QID) | ORAL | Status: DC | PRN

## 2014-06-22 NOTE — Patient Instructions (Signed)
Pre-Operative Instructions  Congratulations, you have decided to take an important step to improving your quality of life.  You can be assured that the doctors of [facility] will be with you every step of the way.  1. Plan to be at the surgery center/hospital at least 1 (one) hour prior to your scheduled time unless otherwise directed by the surgical center/hospital staff.  You must have a responsible adult accompany you, remain during the surgery and drive you home.  Make sure you have directions to the surgical center/hospital and know how to get there on time. 2. For hospital based surgery you will need to obtain a history and physical form from your family physician within 1 month prior to the date of surgery- we will give you a form for you primary physician.  3. We make every effort to accommodate the date you request for surgery.  There are however, times where surgery dates or times have to be moved.  We will contact you as soon as possible if a change in schedule is required.   4. No Aspirin/Ibuprofen for one week before surgery.  If you are on aspirin, any non-steroidal anti-inflammatory medications (Mobic, Aleve, Ibuprofen) you should stop taking it 7 days prior to your surgery.  You make take Tylenol  For pain prior to surgery.  5. Medications- If you are taking daily heart and blood pressure medications, seizure, reflux, allergy, asthma, anxiety, pain or diabetes medications, make sure the surgery center/hospital is aware before the day of surgery so they may notify you which medications to take or avoid the day of surgery. 6. No food or drink after midnight the night before surgery unless directed otherwise by surgical center/hospital staff. 7. No alcoholic beverages 24 hours prior to surgery.  No smoking 24 hours prior to or 24 hours after surgery. 8. Wear loose pants or shorts- loose enough to fit over bandages, boots, and casts. 9. No slip on shoes, sneakers are best. 10. Bring  your boot with you to the surgery center/hospital.  Also bring crutches or a walker if your physician has prescribed it for you.  If you do not have this equipment, it will be provided for you after surgery. 11. If you have not been contracted by the surgery center/hospital by the day before your surgery, call to confirm the date and time of your surgery. 12. Leave-time from work may vary depending on the type of surgery you have.  Appropriate arrangements should be made prior to surgery with your employer. 13. Prescriptions will be provided immediately following surgery by your doctor.  Have these filled as soon as possible after surgery and take the medication as directed. 14. Remove nail polish on the operative foot. 15. Wash the night before surgery.  The night before surgery wash the foot and leg well with the antibacterial soap provided and water paying special attention to beneath the toenails and in between the toes.  Rinse thoroughly with water and dry well with a towel.  Perform this wash unless told not to do so by your physician.  Enclosed: 1 Ice pack (please put in freezer the night before surgery)   1 Hibiclens skin cleaner   Pre-op Instructions  If you have any questions regarding the instructions, do not hesitate to call our office.  [place]: [address] [place], [place] [address] [phone]  [place]: [address] [place], [place] [address] [phone]  [place]: [address]  [place],  [address] [phone]  Dr. Richard   Tuchman DPM, Dr. Norman Regal DPM Dr. Richard Sikora DPM, Dr. M. Todd Hyatt DPM, Dr. Zev Blue DPM 

## 2014-06-24 NOTE — Progress Notes (Signed)
Subjective: Patient presents today for followup of foot pain bilateral. She states the left foot as she points the fifth metatarsal head is painful and the knot on the top of the foot is also painful.  She would like to schedule surgery to remove these bone spurs.  Objective: Neurovascular status is intact. She has a C. shape varus foot type with pressure placed on the lateral portion of the foot upon ambulation. . She has pain along the fifth metatarsal head left foot with pressure. A mild tailors bunion deformity is present left. Dorsal exostosis present left foot and right foot   Assessment: Tailors bunion left, dorsal exostosis left  Plan: Discussed conservative versus surgical options. Recommended a shave tailors bunionectomy left and a dorsal exostectomy left at the midtarsal joint. The consent form was discussed and all three pages were signed and the patient's questions were encouraged and answered to the best of my ability. Risks of the surgery were discussed including but not limited to continued pain, infection, swelling, decreased range of motion,  suture or implant reaction, bleeding, decreased function, etc. Preoperative instructions were also dispensed to the patient as well as a preoperative surgical pamphlet to go along with the instructions. Surgery will be scheduled at the patients convenience and patient will be seen at [facility] specialty surgery center on outpatient basis.The patient is instructed to call if any questions or concerns arise.

## 2014-07-20 DIAGNOSIS — M21619 Bunion of unspecified foot: Secondary | ICD-10-CM

## 2014-07-20 DIAGNOSIS — M898X9 Other specified disorders of bone, unspecified site: Secondary | ICD-10-CM

## 2014-07-22 ENCOUNTER — Telehealth: Payer: Self-pay

## 2014-07-22 NOTE — Telephone Encounter (Signed)
Left message for patient to call with questions or concerns. 

## 2014-07-27 ENCOUNTER — Encounter: Payer: Self-pay | Admitting: Podiatrist

## 2014-07-27 ENCOUNTER — Ambulatory Visit (INDEPENDENT_AMBULATORY_CARE_PROVIDER_SITE_OTHER): Payer: Medicaid Other

## 2014-07-27 ENCOUNTER — Ambulatory Visit (INDEPENDENT_AMBULATORY_CARE_PROVIDER_SITE_OTHER): Payer: Medicaid Other | Admitting: Podiatrist

## 2014-07-27 VITALS — BP 136/59 | HR 95 | Resp 16

## 2014-07-27 DIAGNOSIS — M898X9 Other specified disorders of bone, unspecified site: Secondary | ICD-10-CM

## 2014-07-27 DIAGNOSIS — M7752 Other enthesopathy of left foot: Secondary | ICD-10-CM

## 2014-07-27 DIAGNOSIS — M21622 Bunionette of left foot: Secondary | ICD-10-CM

## 2014-07-27 DIAGNOSIS — M21619 Bunion of unspecified foot: Secondary | ICD-10-CM

## 2014-07-27 MED ORDER — OXYCODONE-ACETAMINOPHEN 10-325 MG PO TABS: 1.0000 | ORAL_TABLET | Freq: Four times a day (QID) | ORAL | Status: DC | PRN

## 2014-07-27 NOTE — Progress Notes (Signed)
Subjective: Patient presents today1 week status post foot surgery of the left foot.  Date of surgery 07-20-14. Procedure performed was a Investment banker, corporate without osteotomy and a dorsal exostectomy base of first metatarsal. Patient denies nausea, vomiting, fevers, chills or night sweats.  Denies calf pain or tenderness to the operative side. States that her foot is throbbing but overall is okay.  Objective:  Neurovascular status is intact with palpable pedal pulses DP and PT bilateral at 2+ out of 4. Neurological sensation is intact and unchanged as per prior to surgery. Excellent appearance of the postoperative foot is noted. Minimal swelling is noted. Incision site is well coapted with sutures and Steri-Strips in place.  Assessment: Status post left foot surgery date of surgery 07/20/2014  Plan:  Removed the suture ends at today's visit. She can get the foot wet on Sunday in the shower. I redressed the foot and a dry sterile dressing and she's to keep this on until Sunday. She may then wrapped the foot in the Ace bandage itself. X-rays are taken today reveal good postoperative progress in healing left foot. Pain medication was also dispensed consisting of Percocet 07/30/2024. I will see her back in 2 weeks for followup. If any problems arise prior that visit she is instructed to call.

## 2014-08-10 ENCOUNTER — Ambulatory Visit (INDEPENDENT_AMBULATORY_CARE_PROVIDER_SITE_OTHER): Payer: Medicaid Other | Admitting: Podiatrist

## 2014-08-10 ENCOUNTER — Encounter: Payer: Self-pay | Admitting: Podiatrist

## 2014-08-10 ENCOUNTER — Ambulatory Visit (INDEPENDENT_AMBULATORY_CARE_PROVIDER_SITE_OTHER): Payer: Medicaid Other

## 2014-08-10 VITALS — BP 145/78 | HR 87 | Temp 97.7°F | Resp 16

## 2014-08-10 DIAGNOSIS — Z9889 Other specified postprocedural states: Secondary | ICD-10-CM

## 2014-08-10 DIAGNOSIS — M779 Enthesopathy, unspecified: Secondary | ICD-10-CM

## 2014-08-10 DIAGNOSIS — M7752 Other enthesopathy of left foot: Secondary | ICD-10-CM

## 2014-08-10 DIAGNOSIS — M21622 Bunionette of left foot: Secondary | ICD-10-CM

## 2014-08-10 DIAGNOSIS — M205X2 Other deformities of toe(s) (acquired), left foot: Secondary | ICD-10-CM

## 2014-08-10 MED ORDER — AMOXICILLIN-POT CLAVULANATE 875-125 MG PO TABS: 1.0000 | ORAL_TABLET | Freq: Two times a day (BID) | ORAL | Status: DC

## 2014-08-10 NOTE — Progress Notes (Signed)
   Subjective:    Patient ID: Rachel [name], female    DOB: 1956-01-28, 58 y.o.   MRN: [id number]  HPI  Pt presents for routine post op but states that her left foot is burning and itching, noted redness and warmth, skin breakdown between great toe and 2nd met. Also c/o right foot pain and wants an injection. Afebrile  Review of Systems     Objective:   Physical Exam  Neurovascular status is intact and unchanged. Burning and tingling is noted on the top of the left foot. Slight redness to the medial aspect of the dorsal incision site is noted. No drainage, no malodor, no streaking or lymphangitis are seen. Normal postoperative swelling is seen. Pain along the lateral aspect of the right foot is also noted at the base of the fifth metatarsal.    Assessment & Plan:  Status post left foot surgery  Plan: Recommended an antibiotic for the burning and redness of the left foot. It appears she may have some postoperative cellulitis. A new dry sterile compressive dressing was applied. She will keep the foot dry and I will see her back for her second postoperative visit in a week. If the redness does not subside she will call.

## 2014-08-18 ENCOUNTER — Ambulatory Visit (INDEPENDENT_AMBULATORY_CARE_PROVIDER_SITE_OTHER): Payer: Medicaid Other | Admitting: Otolaryngology

## 2014-08-18 DIAGNOSIS — H903 Sensorineural hearing loss, bilateral: Secondary | ICD-10-CM

## 2014-08-18 DIAGNOSIS — J330 Polyp of nasal cavity: Secondary | ICD-10-CM

## 2014-08-18 DIAGNOSIS — J310 Chronic rhinitis: Secondary | ICD-10-CM

## 2014-08-24 ENCOUNTER — Ambulatory Visit (INDEPENDENT_AMBULATORY_CARE_PROVIDER_SITE_OTHER): Payer: Medicaid Other

## 2014-08-24 ENCOUNTER — Encounter: Payer: Self-pay | Admitting: Podiatrist

## 2014-08-24 ENCOUNTER — Ambulatory Visit (INDEPENDENT_AMBULATORY_CARE_PROVIDER_SITE_OTHER): Payer: Medicaid Other | Admitting: Podiatrist

## 2014-08-24 VITALS — BP 130/68 | HR 62 | Resp 12

## 2014-08-24 DIAGNOSIS — M205X2 Other deformities of toe(s) (acquired), left foot: Secondary | ICD-10-CM

## 2014-08-24 DIAGNOSIS — M21171 Varus deformity, not elsewhere classified, right ankle: Secondary | ICD-10-CM

## 2014-08-24 DIAGNOSIS — M21622 Bunionette of left foot: Secondary | ICD-10-CM

## 2014-08-24 MED ORDER — OXYCODONE-ACETAMINOPHEN 10-325 MG PO TABS: 1.0000 | ORAL_TABLET | Freq: Four times a day (QID) | ORAL | Status: DC | PRN

## 2014-08-24 NOTE — Patient Instructions (Signed)
Contrast bath:  Get a basin of cold water and a basin of warm water.  Put you foot in the cold for 3-5 minutes and then switch to the warm for 3-5 minutes.  Go back and forth for a total of 20 minutes and end on cold.  This will help calm down that inflammed nerve that is causing burning between the toes.

## 2014-08-26 NOTE — Progress Notes (Signed)
Subjective: Patient presents today for her postop visit. She states that the burning and itching has improved however it is still mildly present between they first and second toe. States otherwise she is doing well.  Objective:   Physical Exam  Neurovascular status is intact and unchanged. Burning and tingling is noted to be improved on the top of the left foot. Healing of the dorsal incision site is noted. No drainage, no malodor, no streaking or lymphangitis are seen. Normal postoperative swelling is seen.  Assessment & Plan:   Status post left foot surgery consisting of a dorsal exostectomy and a Taylor's bunionectomy  Plan: Recommended continued compression. Recommended contrast bath therapy. Discussed the possibility of an injection into the area of tingling and burning however I think this will resolve on its own postoperatively. I'll see her back in 4 weeks for recheck. Also discussed she can start to wear normal enclosed shoe as she is comfortable. She call if any problems or concerns arise.

## 2014-08-29 ENCOUNTER — Encounter: Payer: Self-pay | Admitting: Podiatrist

## 2014-09-07 ENCOUNTER — Encounter: Payer: Self-pay | Admitting: Podiatrist

## 2014-09-07 ENCOUNTER — Ambulatory Visit (INDEPENDENT_AMBULATORY_CARE_PROVIDER_SITE_OTHER): Payer: Medicaid Other | Admitting: Podiatrist

## 2014-09-07 VITALS — BP 141/69 | HR 70 | Resp 16

## 2014-09-07 DIAGNOSIS — Z9889 Other specified postprocedural states: Secondary | ICD-10-CM

## 2014-09-07 DIAGNOSIS — M205X2 Other deformities of toe(s) (acquired), left foot: Secondary | ICD-10-CM

## 2014-09-07 DIAGNOSIS — M21622 Bunionette of left foot: Secondary | ICD-10-CM

## 2014-09-07 MED ORDER — HYDROMORPHONE HCL 2 MG PO TABS: 2.0000 mg | ORAL_TABLET | ORAL | Status: DC | PRN

## 2014-09-08 ENCOUNTER — Ambulatory Visit (INDEPENDENT_AMBULATORY_CARE_PROVIDER_SITE_OTHER): Payer: Medicaid Other | Admitting: Orthopedic Surgery

## 2014-09-08 VITALS — BP 131/69 | Ht 64.0 in | Wt 231.0 lb

## 2014-09-08 DIAGNOSIS — M1711 Unilateral primary osteoarthritis, right knee: Secondary | ICD-10-CM

## 2014-09-08 DIAGNOSIS — M1732 Unilateral post-traumatic osteoarthritis, left knee: Secondary | ICD-10-CM

## 2014-09-08 DIAGNOSIS — M129 Arthropathy, unspecified: Secondary | ICD-10-CM

## 2014-09-08 NOTE — Progress Notes (Signed)
Patient ID: Rachel [name], female   DOB: 1955-11-28, 58 y.o.   MRN: [id number]  Chief Complaint  Patient presents with  . Follow-up    3 month recheck knees, with injections.   Procedure note left knee injection verbal consent was obtained to inject left knee joint  Timeout was completed to confirm the site of injection  The medications used were 40 mg of Depo-Medrol and 1% lidocaine 3 cc  Anesthesia was provided by ethyl chloride and the skin was prepped with alcohol.  After cleaning the skin with alcohol a 20-gauge needle was used to inject the left knee joint. There were no complications. A sterile bandage was applied.   Procedure note right knee injection verbal consent was obtained to inject right knee joint  Timeout was completed to confirm the site of injection  The medications used were 40 mg of Depo-Medrol and 1% lidocaine 3 cc  Anesthesia was provided by ethyl chloride and the skin was prepped with alcohol.  After cleaning the skin with alcohol a 20-gauge needle was used to inject the right knee joint. There were no complications. A sterile bandage was applied.

## 2014-09-11 NOTE — Progress Notes (Signed)
Subjective: Patient presents today for her postop visit. States she has pain at the incision site on the lateral foot. Relates the burning between the toes is improved.  Also requests dilaudid today for the pain.   Objective:   Physical Exam  Neurovascular status is intact and unchanged. Burning and tingling is noted to be improved on the top of the left foot. Healing of the dorsal incision site is noted. No drainage, no malodor, no streaking or lymphangitis are seen. Normal postoperative swelling is seen.  Assessment & Plan:   Status post left foot surgery consisting of a dorsal exostectomy and a Taylor's bunionectomy  Plan: Recommended continued compression. And ice for the tailors bunion site.  Discussed she is within the normal post op time period where swelling is expected and likely this is why she is still in discomfort. Recommended she elevate consistently throughout the day.  She will be seen back in 4 weeks.

## 2014-10-04 ENCOUNTER — Emergency Department (HOSPITAL_COMMUNITY): Payer: Medicaid Other

## 2014-10-04 ENCOUNTER — Emergency Department (HOSPITAL_COMMUNITY)
Admission: EM | Admit: 2014-10-04 | Discharge: 2014-10-05 | Disposition: A | Discharge status: Home / Self Care | Payer: Medicaid Other | Attending: Emergency Medicine | Admitting: Emergency Medicine

## 2014-10-04 ENCOUNTER — Encounter (HOSPITAL_COMMUNITY): Payer: Self-pay

## 2014-10-04 DIAGNOSIS — E785 Hyperlipidemia, unspecified: Secondary | ICD-10-CM | POA: Diagnosis not present

## 2014-10-04 DIAGNOSIS — Z79899 Other long term (current) drug therapy: Secondary | ICD-10-CM | POA: Insufficient documentation

## 2014-10-04 DIAGNOSIS — Z87828 Personal history of other (healed) physical injury and trauma: Secondary | ICD-10-CM | POA: Insufficient documentation

## 2014-10-04 DIAGNOSIS — E669 Obesity, unspecified: Secondary | ICD-10-CM | POA: Insufficient documentation

## 2014-10-04 DIAGNOSIS — R509 Fever, unspecified: Secondary | ICD-10-CM

## 2014-10-04 DIAGNOSIS — R079 Chest pain, unspecified: Secondary | ICD-10-CM | POA: Diagnosis present

## 2014-10-04 DIAGNOSIS — F419 Anxiety disorder, unspecified: Secondary | ICD-10-CM | POA: Diagnosis not present

## 2014-10-04 DIAGNOSIS — M549 Dorsalgia, unspecified: Secondary | ICD-10-CM | POA: Insufficient documentation

## 2014-10-04 DIAGNOSIS — N390 Urinary tract infection, site not specified: Secondary | ICD-10-CM

## 2014-10-04 DIAGNOSIS — F329 Major depressive disorder, single episode, unspecified: Secondary | ICD-10-CM | POA: Insufficient documentation

## 2014-10-04 DIAGNOSIS — I10 Essential (primary) hypertension: Secondary | ICD-10-CM | POA: Insufficient documentation

## 2014-10-04 DIAGNOSIS — M1990 Unspecified osteoarthritis, unspecified site: Secondary | ICD-10-CM | POA: Diagnosis not present

## 2014-10-04 DIAGNOSIS — Z8781 Personal history of (healed) traumatic fracture: Secondary | ICD-10-CM | POA: Insufficient documentation

## 2014-10-04 DIAGNOSIS — Z8719 Personal history of other diseases of the digestive system: Secondary | ICD-10-CM | POA: Insufficient documentation

## 2014-10-04 DIAGNOSIS — J069 Acute upper respiratory infection, unspecified: Secondary | ICD-10-CM | POA: Diagnosis not present

## 2014-10-04 DIAGNOSIS — R630 Anorexia: Secondary | ICD-10-CM | POA: Insufficient documentation

## 2014-10-04 LAB — COMPREHENSIVE METABOLIC PANEL
ALT: 14 U/L (ref 0–35)
AST: 23 U/L (ref 0–37)
Albumin: 3.3 g/dL — ABNORMAL LOW (ref 3.5–5.2)
Alkaline Phosphatase: 78 U/L (ref 39–117)
Anion gap: 11 (ref 5–15)
BUN: 9 mg/dL (ref 6–23)
CALCIUM: 8.9 mg/dL (ref 8.4–10.5)
CHLORIDE: 100 meq/L (ref 96–112)
CO2: 25 meq/L (ref 19–32)
CREATININE: 0.83 mg/dL (ref 0.50–1.10)
GFR, EST AFRICAN AMERICAN: 88 mL/min — AB (ref >90)
GFR, EST NON AFRICAN AMERICAN: 76 mL/min — AB (ref >90)
GLUCOSE: 113 mg/dL — AB (ref 70–99)
Potassium: 4 mEq/L (ref 3.7–5.3)
Sodium: 136 mEq/L — ABNORMAL LOW (ref 137–147)
Total Bilirubin: 0.4 mg/dL (ref 0.3–1.2)
Total Protein: 7.5 g/dL (ref 6.0–8.3)

## 2014-10-04 LAB — CBC WITH DIFFERENTIAL/PLATELET
Basophils Absolute: 0 10*3/uL (ref 0.0–0.1)
Basophils Relative: 1 % (ref 0–1)
EOS PCT: 2 % (ref 0–5)
Eosinophils Absolute: 0.1 10*3/uL (ref 0.0–0.7)
HCT: 34.4 % — ABNORMAL LOW (ref 36.0–46.0)
HEMOGLOBIN: 11.6 g/dL — AB (ref 12.0–15.0)
LYMPHS ABS: 0.9 10*3/uL (ref 0.7–4.0)
LYMPHS PCT: 13 % (ref 12–46)
MCH: 30.6 pg (ref 26.0–34.0)
MCHC: 33.7 g/dL (ref 30.0–36.0)
MCV: 90.8 fL (ref 78.0–100.0)
MONO ABS: 0.4 10*3/uL (ref 0.1–1.0)
MONOS PCT: 6 % (ref 3–12)
NEUTROS ABS: 5.2 10*3/uL (ref 1.7–7.7)
Neutrophils Relative %: 78 % — ABNORMAL HIGH (ref 43–77)
Platelets: 189 10*3/uL (ref 150–400)
RBC: 3.79 MIL/uL — ABNORMAL LOW (ref 3.87–5.11)
RDW: 12.7 % (ref 11.5–15.5)
WBC: 6.6 10*3/uL (ref 4.0–10.5)

## 2014-10-04 LAB — URINALYSIS, ROUTINE W REFLEX MICROSCOPIC
Bilirubin Urine: NEGATIVE
Glucose, UA: NEGATIVE mg/dL
Hgb urine dipstick: NEGATIVE
Ketones, ur: NEGATIVE mg/dL
Nitrite: NEGATIVE
Specific Gravity, Urine: 1.01 (ref 1.005–1.030)
UROBILINOGEN UA: 0.2 mg/dL (ref 0.0–1.0)
pH: 7 (ref 5.0–8.0)

## 2014-10-04 LAB — LACTIC ACID, PLASMA: LACTIC ACID, VENOUS: 0.9 mmol/L (ref 0.5–2.2)

## 2014-10-04 LAB — INFLUENZA PANEL BY PCR (TYPE A & B)
H1N1 flu by pcr: NOT DETECTED
INFLAPCR: NEGATIVE
Influenza B By PCR: NEGATIVE

## 2014-10-04 LAB — URINE MICROSCOPIC-ADD ON

## 2014-10-04 MED ORDER — CEPHALEXIN 500 MG PO CAPS
500.0000 mg | ORAL_CAPSULE | Freq: Once | ORAL | Status: AC
  Administered 2014-10-05: 500 mg via ORAL
  Filled 2014-10-04: qty 1

## 2014-10-04 MED ORDER — ACETAMINOPHEN 500 MG PO TABS
1000.0000 mg | ORAL_TABLET | Freq: Once | ORAL | Status: AC
  Administered 2014-10-04: 1000 mg via ORAL
  Filled 2014-10-04: qty 2

## 2014-10-04 MED ORDER — CEPHALEXIN 500 MG PO CAPS: 500.0000 mg | ORAL_CAPSULE | Freq: Two times a day (BID) | ORAL | Status: DC

## 2014-10-04 MED ORDER — HYDROCODONE-ACETAMINOPHEN 5-325 MG PO TABS
1.0000 | ORAL_TABLET | Freq: Once | ORAL | Status: AC
  Administered 2014-10-05: 1 via ORAL
  Filled 2014-10-04: qty 1

## 2014-10-04 MED ORDER — SODIUM CHLORIDE 0.9 % IV BOLUS (SEPSIS)
500.0000 mL | INTRAVENOUS | Status: AC
  Administered 2014-10-04: 500 mL via INTRAVENOUS

## 2014-10-04 MED ORDER — SODIUM CHLORIDE 0.9 % IV BOLUS (SEPSIS)
1000.0000 mL | INTRAVENOUS | Status: AC
  Administered 2014-10-04: 1000 mL via INTRAVENOUS

## 2014-10-04 MED ORDER — KETOROLAC TROMETHAMINE 30 MG/ML IJ SOLN
30.0000 mg | Freq: Once | INTRAMUSCULAR | Status: AC
  Administered 2014-10-04: 30 mg via INTRAVENOUS
  Filled 2014-10-04: qty 1

## 2014-10-04 NOTE — ED Notes (Signed)
Has been ill with URI for 6 days, today increasing SOB and mid chest pain onset at 12 noon. Associated with back pain and increased SOB, all worse with movement palpation

## 2014-10-04 NOTE — ED Provider Notes (Addendum)
12:00 AM  Assumed care from Dr. Sabra Heck.  Pt is a 58 y.o. F who presents emergency department with fevers, cough, chest pain and shortness of breath, back pain. She describes to me that the chest pain and back pain are worse with coughing. Described as an achy pain. No numbness or focal weakness. Chest pain and back pain appear to be musculoskeletal in nature. Plan was to follow-up on flu swab, urinalysis. Flu swab negative. Urine shows possible infection. Culture pending. Will discharge on Keflex. She states she is still having some mild chest pain with coughing. We'll give 1 tablet of Vicodin. We'll discharge home with return precautions. She is comfortable with this plan.  [place], DO 10/04/14 [place], DO 10/04/14 [id number]
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
## 2014-10-04 NOTE — Discharge Instructions (Signed)
Fever, Adult A fever is a higher than normal body temperature. In an adult, an oral temperature around 98.6 F (37 C) is considered normal. A temperature of 100.4 F (38 C) or higher is generally considered a fever. Mild or moderate fevers generally have no long-term effects and often do not require treatment. Extreme fever (greater than or equal to 106 F or 41.1 C) can cause seizures. The sweating that may occur with repeated or prolonged fever may cause dehydration. Elderly people can develop confusion during a fever. A measured temperature can vary with:  Age.  Time of day.  Method of measurement (mouth, underarm, rectal, or ear). The fever is confirmed by taking a temperature with a thermometer. Temperatures can be taken different ways. Some methods are accurate and some are not.  An oral temperature is used most commonly. Electronic thermometers are fast and accurate.  An ear temperature will only be accurate if the thermometer is positioned as recommended by the manufacturer.  A rectal temperature is accurate and done for those adults who have a condition where an oral temperature cannot be taken.  An underarm (axillary) temperature is not accurate and not recommended. Fever is a symptom, not a disease.  CAUSES   Infections commonly cause fever.  Some noninfectious causes for fever include:  Some arthritis conditions.  Some thyroid or adrenal gland conditions.  Some immune system conditions.  Some types of cancer.  A medicine reaction.  High doses of certain street drugs such as methamphetamine.  Dehydration.  Exposure to high outside or room temperatures.  Occasionally, the source of a fever cannot be determined. This is sometimes called a "fever of unknown origin" (FUO).  Some situations may lead to a temporary rise in body temperature that may go away on its own. Examples are:  Childbirth.  Surgery.  Intense exercise. HOME CARE INSTRUCTIONS   Take  appropriate medicines for fever. Follow dosing instructions carefully. If you use acetaminophen to reduce the fever, be careful to avoid taking other medicines that also contain acetaminophen. Do not take aspirin for a fever if you are younger than age 67. There is an association with Reye's syndrome. Reye's syndrome is a rare but potentially deadly disease.  If an infection is present and antibiotics have been prescribed, take them as directed. Finish them even if you start to feel better.  Rest as needed.  Maintain an adequate fluid intake. To prevent dehydration during an illness with prolonged or recurrent fever, you may need to drink extra fluid.Drink enough fluids to keep your urine clear or pale yellow.  Sponging or bathing with room temperature water may help reduce body temperature. Do not use ice water or alcohol sponge baths.  Dress comfortably, but do not over-bundle. SEEK MEDICAL CARE IF:   You are unable to keep fluids down.  You develop vomiting or diarrhea.  You are not feeling at least partly better after 3 days.  You develop new symptoms or problems. SEEK IMMEDIATE MEDICAL CARE IF:   You have shortness of breath or trouble breathing.  You develop excessive weakness.  You are dizzy or you faint.  You are extremely thirsty or you are making little or no urine.  You develop new pain that was not there before (such as in the head, neck, chest, back, or abdomen).  You have persistent vomiting and diarrhea for more than 1 to 2 days.  You develop a stiff neck or your eyes become sensitive to light.  You develop a  skin rash. °· You have a fever or persistent symptoms for more than 2 to 3 days. °· You have a fever and your symptoms suddenly get worse. °MAKE SURE YOU:  °· Understand these instructions. °· Will watch your condition. °· Will get help right away if you are not doing well or get worse. °Document Released: 04/09/2001 Document Revised: 02/28/2014 Document  Reviewed: 08/15/2011 °ExitCare® Patient Information ©2015 [employer]. This information is not intended to replace advice given to you by your health care provider. Make sure you discuss any questions you have with your health care provider. ° °Upper Respiratory Infection, Adult °An upper respiratory infection (URI) is also sometimes known as the common cold. The upper respiratory tract includes the nose, sinuses, throat, trachea, and bronchi. Bronchi are the airways leading to the lungs. Most people improve within 1 week, but symptoms can last up to 2 weeks. A residual cough may last even longer.  °CAUSES °Many different viruses can infect the tissues lining the upper respiratory tract. The tissues become irritated and inflamed and often become very moist. Mucus production is also common. A cold is contagious. You can easily spread the virus to others by oral contact. This includes kissing, sharing a glass, coughing, or sneezing. Touching your mouth or nose and then touching a surface, which is then touched by another person, can also spread the virus. °SYMPTOMS  °Symptoms typically develop 1 to 3 days after you come in contact with a cold virus. Symptoms vary from person to person. They may include: °· Runny nose. °· Sneezing. °· Nasal congestion. °· Sinus irritation. °· Sore throat. °· Loss of voice (laryngitis). °· Cough. °· Fatigue. °· Muscle aches. °· Loss of appetite. °· Headache. °· Low-grade fever. °DIAGNOSIS  °You might diagnose your own cold based on familiar symptoms, since most people get a cold 2 to 3 times a year. Your caregiver can confirm this based on your exam. Most importantly, your caregiver can check that your symptoms are not due to another disease such as strep throat, sinusitis, pneumonia, asthma, or epiglottitis. Blood tests, throat tests, and X-rays are not necessary to diagnose a common cold, but they may sometimes be helpful in excluding other more serious diseases. Your caregiver will  decide if any further tests are required. °RISKS AND COMPLICATIONS  °You may be at risk for a more severe case of the common cold if you smoke cigarettes, have chronic heart disease (such as heart failure) or lung disease (such as asthma), or if you have a weakened immune system. The very young and very old are also at risk for more serious infections. Bacterial sinusitis, middle ear infections, and bacterial pneumonia can complicate the common cold. The common cold can worsen asthma and chronic obstructive pulmonary disease (COPD). Sometimes, these complications can require emergency medical care and may be life-threatening. °PREVENTION  °The best way to protect against getting a cold is to practice good hygiene. Avoid oral or hand contact with people with cold symptoms. Wash your hands often if contact occurs. There is no clear evidence that vitamin C, vitamin E, echinacea, or exercise reduces the chance of developing a cold. However, it is always recommended to get plenty of rest and practice good nutrition. °TREATMENT  °Treatment is directed at relieving symptoms. There is no cure. Antibiotics are not effective, because the infection is caused by a virus, not by bacteria. Treatment may include: °· Increased fluid intake. Sports drinks offer valuable electrolytes, sugars, and fluids. °· Breathing heated mist   or steam (vaporizer or shower).  Eating chicken soup or other clear broths, and maintaining good nutrition.  Getting plenty of rest.  Using gargles or lozenges for comfort.  Controlling fevers with ibuprofen or acetaminophen as directed by your caregiver.  Increasing usage of your inhaler if you have asthma. Zinc gel and zinc lozenges, taken in the first 24 hours of the common cold, can shorten the duration and lessen the severity of symptoms. Pain medicines may help with fever, muscle aches, and throat pain. A variety of non-prescription medicines are available to treat congestion and runny nose.  Your caregiver can make recommendations and may suggest nasal or lung inhalers for other symptoms.  HOME CARE INSTRUCTIONS   Only take over-the-counter or prescription medicines for pain, discomfort, or fever as directed by your caregiver.  Use a warm mist humidifier or inhale steam from a shower to increase air moisture. This may keep secretions moist and make it easier to breathe.  Drink enough water and fluids to keep your urine clear or pale yellow.  Rest as needed.  Return to work when your temperature has returned to normal or as your caregiver advises. You may need to stay home longer to avoid infecting others. You can also use a face mask and careful hand washing to prevent spread of the virus. SEEK MEDICAL CARE IF:   After the first few days, you feel you are getting worse rather than better.  You need your caregiver's advice about medicines to control symptoms.  You develop chills, worsening shortness of breath, or brown or red sputum. These may be signs of pneumonia.  You develop yellow or brown nasal discharge or pain in the face, especially when you bend forward. These may be signs of sinusitis.  You develop a fever, swollen neck glands, pain with swallowing, or white areas in the back of your throat. These may be signs of strep throat. SEEK IMMEDIATE MEDICAL CARE IF:   You have a fever.  You develop severe or persistent headache, ear pain, sinus pain, or chest pain.  You develop wheezing, a prolonged cough, cough up blood, or have a change in your usual mucus (if you have chronic lung disease).  You develop sore muscles or a stiff neck. Document Released: 04/09/2001 Document Revised: 01/06/2012 Document Reviewed: 01/19/2014 [facility] Patient Information 2015 [place], [place]. This information is not intended to replace advice given to you by your health care provider. Make sure you discuss any questions you have with your health care provider.  Urinary Tract  Infection Urinary tract infections (UTIs) can develop anywhere along your urinary tract. Your urinary tract is your body's drainage system for removing wastes and extra water. Your urinary tract includes two kidneys, two ureters, a bladder, and a urethra. Your kidneys are a pair of bean-shaped organs. Each kidney is about the size of your fist. They are located below your ribs, one on each side of your spine. CAUSES Infections are caused by microbes, which are microscopic organisms, including fungi, viruses, and bacteria. These organisms are so small that they can only be seen through a microscope. Bacteria are the microbes that most commonly cause UTIs. SYMPTOMS  Symptoms of UTIs may vary by age and gender of the patient and by the location of the infection. Symptoms in young women typically include a frequent and intense urge to urinate and a painful, burning feeling in the bladder or urethra during urination. Older women and men are more likely to be tired, shaky, and weak  and have muscle aches and abdominal pain. A fever may mean the infection is in your kidneys. Other symptoms of a kidney infection include pain in your back or sides below the ribs, nausea, and vomiting. DIAGNOSIS To diagnose a UTI, your caregiver will ask you about your symptoms. Your caregiver also will ask to provide a urine sample. The urine sample will be tested for bacteria and white blood cells. White blood cells are made by your body to help fight infection. TREATMENT  Typically, UTIs can be treated with medication. Because most UTIs are caused by a bacterial infection, they usually can be treated with the use of antibiotics. The choice of antibiotic and length of treatment depend on your symptoms and the type of bacteria causing your infection. HOME CARE INSTRUCTIONS  If you were prescribed antibiotics, take them exactly as your caregiver instructs you. Finish the medication even if you feel better after you have only taken  some of the medication.  Drink enough water and fluids to keep your urine clear or pale yellow.  Avoid caffeine, tea, and carbonated beverages. They tend to irritate your bladder.  Empty your bladder often. Avoid holding urine for long periods of time.  Empty your bladder before and after sexual intercourse.  After a bowel movement, women should cleanse from front to back. Use each tissue only once. SEEK MEDICAL CARE IF:   You have back pain.  You develop a fever.  Your symptoms do not begin to resolve within 3 days. SEEK IMMEDIATE MEDICAL CARE IF:   You have severe back pain or lower abdominal pain.  You develop chills.  You have nausea or vomiting.  You have continued burning or discomfort with urination. MAKE SURE YOU:   Understand these instructions.  Will watch your condition.  Will get help right away if you are not doing well or get worse. Document Released: 07/24/2005 Document Revised: 04/14/2012 Document Reviewed: 11/22/2011 [facility] Patient Information 2015 [place], [place]. This information is not intended to replace advice given to you by your health care provider. Make sure you discuss any questions you have with your health care provider.

## 2014-10-05 ENCOUNTER — Encounter: Payer: Medicaid Other | Admitting: Podiatrist

## 2014-10-05 NOTE — ED Notes (Signed)
Up to bedside commode, pt weak but not c/o dizziness. Voided 200cc

## 2014-10-05 NOTE — ED Notes (Signed)
Improving, has been up to commode, feels stronger s/p IVF.

## 2014-10-06 LAB — URINE CULTURE: Colony Count: 50000

## 2014-10-09 LAB — CULTURE, BLOOD (ROUTINE X 2)
Culture: NO GROWTH
Culture: NO GROWTH

## 2014-10-12 ENCOUNTER — Ambulatory Visit (INDEPENDENT_AMBULATORY_CARE_PROVIDER_SITE_OTHER): Payer: Medicaid Other | Admitting: Podiatrist

## 2014-10-12 ENCOUNTER — Ambulatory Visit (INDEPENDENT_AMBULATORY_CARE_PROVIDER_SITE_OTHER): Payer: Medicaid Other

## 2014-10-12 VITALS — BP 146/66 | HR 78 | Resp 16

## 2014-10-12 DIAGNOSIS — M2012 Hallux valgus (acquired), left foot: Secondary | ICD-10-CM

## 2014-10-12 DIAGNOSIS — M21612 Bunion of left foot: Secondary | ICD-10-CM

## 2014-10-12 MED ORDER — OXYCODONE-ACETAMINOPHEN 10-325 MG PO TABS: 1.0000 | ORAL_TABLET | Freq: Four times a day (QID) | ORAL | Status: DC | PRN

## 2014-10-12 NOTE — Progress Notes (Signed)
  Subjective: Patient presents today for her postop visit. States she has pain at the incision site on the dorsal foot. Relates she still has some numbness and burning between the first and second toes..   Objective:   Physical Exam  Neurovascular status is intact and unchanged. Burning and tingling is noted to be back on the top of the left foot just distal to the incision site where the exostosis was removed.. Scar tissue with Healing of the dorsal incision site is noted. No drainage, no malodor, no streaking or lymphangitis are seen. Normal postoperative swelling is seen.  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 This was carried out today under sterile technique and without complication with Kenalog and Marcaine mixture.Marland Kitchen   She will be seen back in 4 weeks. If she for oxycodone 07/30/2024 was also dispensed to the amount of pain she is experiencing at night.

## 2014-11-16 ENCOUNTER — Ambulatory Visit (INDEPENDENT_AMBULATORY_CARE_PROVIDER_SITE_OTHER): Payer: Medicaid Other

## 2014-11-16 ENCOUNTER — Encounter: Payer: Self-pay | Admitting: Podiatrist

## 2014-11-16 ENCOUNTER — Ambulatory Visit (INDEPENDENT_AMBULATORY_CARE_PROVIDER_SITE_OTHER): Payer: Medicaid Other | Admitting: Podiatrist

## 2014-11-16 VITALS — BP 144/66 | HR 83 | Resp 14

## 2014-11-16 DIAGNOSIS — M21622 Bunionette of left foot: Secondary | ICD-10-CM

## 2014-11-16 DIAGNOSIS — M216X2 Other acquired deformities of left foot: Secondary | ICD-10-CM

## 2014-11-16 MED ORDER — GABAPENTIN 100 MG PO CAPS: 100.0000 mg | ORAL_CAPSULE | Freq: Two times a day (BID) | ORAL | Status: DC

## 2014-11-16 MED ORDER — OXYCODONE-ACETAMINOPHEN 10-325 MG PO TABS: 1.0000 | ORAL_TABLET | Freq: Four times a day (QID) | ORAL | Status: DC | PRN

## 2014-11-16 NOTE — Progress Notes (Signed)
Chief Complaint  Patient presents with  . Routine Post Op    DOS 07-20-2014 POV Dorsal exostectomy and tailor's bunionectomy left   "It feels like there's a knot on the bottom."         Allergies  Allergen Reactions  . Lisinopril     REACTION: Cough  . Prednisone     REACTION: sick on the stomach  . Betadine [Povidone Iodine] Rash         Subjective: Patient presents today for her postop visit. She states she is still experiencing pain although relates it as dull in nature.   Objective:   Physical Exam  Neurovascular status is intact and unchanged. Burning and tingling is noted to be on the top of the left foot just distal to the incision site where the exostosis was removed.. Scar tissue with Healing of the dorsal incision site is noted. No drainage, no malodor, no streaking or lymphangitis are seen. No postoperative swelling is seen.  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  She requested a refill for oxycodone 07/30/2024  Which was dispensed for her today.  i did discuss that I am limited as to the amount of pain medication I will continue to write and if she is still in a significant enough pain at the next visit, will need to transition her to pain management or similar.  Will order her the cream through [facility] and see if she can get it.

## 2014-11-16 NOTE — Patient Instructions (Signed)
Gabapentin capsules or tablets What is this medicine? GABAPENTIN (GA ba pen tin) is used to control partial seizures in adults with epilepsy. It is also used to treat certain types of nerve pain. This medicine may be used for other purposes; ask your health care provider or pharmacist if you have questions. COMMON BRAND NAME(S): Gabarone, Neurontin What should I tell my health care provider before I take this medicine? They need to know if you have any of these conditions: -kidney disease -suicidal thoughts, plans, or attempt; a previous suicide attempt by you or a family member -an unusual or allergic reaction to gabapentin, other medicines, foods, dyes, or preservatives -pregnant or trying to get pregnant -breast-feeding How should I use this medicine? Take this medicine by mouth with a glass of water. Follow the directions on the prescription label. You can take it with or without food. If it upsets your stomach, take it with food.Take your medicine at regular intervals. Do not take it more often than directed. Do not stop taking except on your doctor's advice. If you are directed to break the 600 or 800 mg tablets in half as part of your dose, the extra half tablet should be used for the next dose. If you have not used the extra half tablet within 28 days, it should be thrown away. A special MedGuide will be given to you by the pharmacist with each prescription and refill. Be sure to read this information carefully each time. Talk to your pediatrician regarding the use of this medicine in children. Special care may be needed. Overdosage: If you think you have taken too much of this medicine contact a poison control center or emergency room at once. NOTE: This medicine is only for you. Do not share this medicine with others. What if I miss a dose? If you miss a dose, take it as soon as you can. If it is almost time for your next dose, take only that dose. Do not take double or extra  doses. What may interact with this medicine? Do not take this medicine with any of the following medications: -other gabapentin products This medicine may also interact with the following medications: -alcohol -antacids -antihistamines for allergy, cough and cold -certain medicines for anxiety or sleep -certain medicines for depression or psychotic disturbances -homatropine; hydrocodone -naproxen -narcotic medicines (opiates) for pain -phenothiazines like chlorpromazine, mesoridazine, prochlorperazine, thioridazine This list may not describe all possible interactions. Give your health care provider a list of all the medicines, herbs, non-prescription drugs, or dietary supplements you use. Also tell them if you smoke, drink alcohol, or use illegal drugs. Some items may interact with your medicine. What should I watch for while using this medicine? Visit your doctor or health care professional for regular checks on your progress. You may want to keep a record at home of how you feel your condition is responding to treatment. You may want to share this information with your doctor or health care professional at each visit. You should contact your doctor or health care professional if your seizures get worse or if you have any new types of seizures. Do not stop taking this medicine or any of your seizure medicines unless instructed by your doctor or health care professional. Stopping your medicine suddenly can increase your seizures or their severity. Wear a medical identification bracelet or chain if you are taking this medicine for seizures, and carry a card that lists all your medications. You may get drowsy, dizzy, or have blurred   vision. Do not drive, use machinery, or do anything that needs mental alertness until you know how this medicine affects you. To reduce dizzy or fainting spells, do not sit or stand up quickly, especially if you are an older patient. Alcohol can increase drowsiness and  dizziness. Avoid alcoholic drinks. Your mouth may get dry. Chewing sugarless gum or sucking hard candy, and drinking plenty of water will help. The use of this medicine may increase the chance of suicidal thoughts or actions. Pay special attention to how you are responding while on this medicine. Any worsening of mood, or thoughts of suicide or dying should be reported to your health care professional right away. Women who become pregnant while using this medicine may enroll in the [place] Antiepileptic Drug Pregnancy Registry by calling 1-888-233-2334. This registry collects information about the safety of antiepileptic drug use during pregnancy. What side effects may I notice from receiving this medicine? Side effects that you should report to your doctor or health care professional as soon as possible: -allergic reactions like skin rash, itching or hives, swelling of the face, lips, or tongue -worsening of mood, thoughts or actions of suicide or dying Side effects that usually do not require medical attention (report to your doctor or health care professional if they continue or are bothersome): -constipation -difficulty walking or controlling muscle movements -dizziness -nausea -slurred speech -tiredness -tremors -weight gain This list may not describe all possible side effects. Call your doctor for medical advice about side effects. You may report side effects to FDA at 1-800-FDA-1088. Where should I keep my medicine? Keep out of reach of children. Store at room temperature between 15 and 30 degrees C (59 and 86 degrees F). Throw away any unused medicine after the expiration date. NOTE: This sheet is a summary. It may not cover all possible information. If you have questions about this medicine, talk to your doctor, pharmacist, or health care provider.  2015, [employer]. (2013-06-17 09:12:48)  

## 2014-11-25 ENCOUNTER — Telehealth: Payer: Self-pay | Admitting: *Deleted

## 2014-11-25 NOTE — Telephone Encounter (Signed)
[facility] states a Percocet prescription was given to their facility and not signed.  Pharmacist stated Caryl Pina was to mail a signed copy, 9 days ago.

## 2014-11-28 MED ORDER — OXYCODONE-ACETAMINOPHEN 10-325 MG PO TABS: 1.0000 | ORAL_TABLET | Freq: Four times a day (QID) | ORAL | Status: DC | PRN

## 2014-11-28 NOTE — Telephone Encounter (Signed)
Patient left the office with an unsigned Rx for Percocet. Talked with pharmacy and they said they would go ahead and fill Rx but would need a signed Rx mailed to their facility to keep on record. I did forget to mail that day. Reprinted the Rx today and will mail to [place].

## 2014-12-13 ENCOUNTER — Ambulatory Visit: Payer: Medicaid Other | Admitting: Orthopedic Surgery

## 2014-12-14 ENCOUNTER — Ambulatory Visit: Payer: Medicaid Other | Admitting: Podiatrist

## 2014-12-19 ENCOUNTER — Ambulatory Visit (INDEPENDENT_AMBULATORY_CARE_PROVIDER_SITE_OTHER): Payer: Medicaid Other | Admitting: Orthopedic Surgery

## 2014-12-19 ENCOUNTER — Encounter: Payer: Self-pay | Admitting: Orthopedic Surgery

## 2014-12-19 VITALS — BP 122/64 | Ht 64.0 in | Wt 231.0 lb

## 2014-12-19 DIAGNOSIS — M1711 Unilateral primary osteoarthritis, right knee: Secondary | ICD-10-CM

## 2014-12-19 DIAGNOSIS — M129 Arthropathy, unspecified: Secondary | ICD-10-CM

## 2014-12-19 DIAGNOSIS — M1732 Unilateral post-traumatic osteoarthritis, left knee: Secondary | ICD-10-CM

## 2014-12-19 MED ORDER — HYDROCODONE-ACETAMINOPHEN 10-325 MG PO TABS: 1.0000 | ORAL_TABLET | Freq: Four times a day (QID) | ORAL | Status: DC | PRN

## 2014-12-19 NOTE — Progress Notes (Signed)
Chief Complaint  Patient presents with  . Follow-up    3 month recheck, repeat injections bilateral knees   BP 122/64 mmHg  Ht 5\' 4"  (1.626 m)  Wt 231 lb (104.781 kg)  BMI 39.63 kg/m2  Encounter Diagnoses  Name Primary?  . Post-traumatic osteoarthritis of left knee Yes  . Arthritis of knee, right     Procedure note left knee injection verbal consent was obtained to inject left knee joint  Timeout was completed to confirm the site of injection  The medications used were 40 mg of Depo-Medrol and 1% lidocaine 3 cc  Anesthesia was provided by ethyl chloride and the skin was prepped with alcohol.  After cleaning the skin with alcohol a 20-gauge needle was used to inject the left knee joint. There were no complications. A sterile bandage was applied.   Procedure note right knee injection verbal consent was obtained to inject right knee joint  Timeout was completed to confirm the site of injection  The medications used were 40 mg of Depo-Medrol and 1% lidocaine 3 cc  Anesthesia was provided by ethyl chloride and the skin was prepped with alcohol.  After cleaning the skin with alcohol a 20-gauge needle was used to inject the right knee joint. There were no complications. A sterile bandage was applied.  The patient has stopped going to her pain management center and asked for some oxycodone 10 mg which she received from the doctor who operated on her foot  She was denied. She was advised to seek new pain management specialist because of the dangers of opioids.  He was given a prescription of 10 mg of hydrocodone instead two-week supply no refills and no further prescriptions  Meds ordered this encounter  Medications  . HYDROcodone-acetaminophen (NORCO) 10-325 MG per tablet    Sig: Take 1 tablet by mouth every 6 (six) hours as needed.    Dispense:  56 tablet    Refill:  0

## 2015-01-11 ENCOUNTER — Ambulatory Visit: Payer: Medicaid Other | Admitting: Podiatrist

## 2015-02-16 ENCOUNTER — Ambulatory Visit (INDEPENDENT_AMBULATORY_CARE_PROVIDER_SITE_OTHER): Payer: Medicaid Other | Admitting: Otolaryngology

## 2015-03-21 ENCOUNTER — Ambulatory Visit (INDEPENDENT_AMBULATORY_CARE_PROVIDER_SITE_OTHER): Payer: Medicaid Other | Admitting: Orthopedic Surgery

## 2015-03-21 VITALS — BP 133/61 | Ht 64.0 in | Wt 231.0 lb

## 2015-03-21 DIAGNOSIS — M1711 Unilateral primary osteoarthritis, right knee: Secondary | ICD-10-CM

## 2015-03-21 DIAGNOSIS — M129 Arthropathy, unspecified: Secondary | ICD-10-CM | POA: Diagnosis not present

## 2015-03-21 MED ORDER — TIZANIDINE HCL 4 MG PO TABS: 4.0000 mg | ORAL_TABLET | Freq: Four times a day (QID) | ORAL | Status: DC | PRN

## 2015-03-21 NOTE — Progress Notes (Signed)
Patient ID: Rachel [name], female   DOB: 06-01-1956, 59 y.o.   MRN: [id number] Chief Complaint  Patient presents with  . Follow-up    3 month recheck on bilateral knees with injections.    Procedure note left knee injection verbal consent was obtained to inject left knee joint  Timeout was completed to confirm the site of injection  The medications used were 40 mg of Depo-Medrol and 1% lidocaine 3 cc  Anesthesia was provided by ethyl chloride and the skin was prepped with alcohol.  After cleaning the skin with alcohol a 20-gauge needle was used to inject the left knee joint. There were no complications. A sterile bandage was applied.   Procedure note right knee injection verbal consent was obtained to inject right knee joint  Timeout was completed to confirm the site of injection  The medications used were 40 mg of Depo-Medrol and 1% lidocaine 3 cc  Anesthesia was provided by ethyl chloride and the skin was prepped with alcohol.  After cleaning the skin with alcohol a 20-gauge needle was used to inject the right knee joint. There were no complications. A sterile bandage was applied.  Added muscle relaxer

## 2015-03-23 ENCOUNTER — Ambulatory Visit (INDEPENDENT_AMBULATORY_CARE_PROVIDER_SITE_OTHER): Payer: Medicaid Other | Admitting: Otolaryngology

## 2015-03-23 DIAGNOSIS — J341 Cyst and mucocele of nose and nasal sinus: Secondary | ICD-10-CM | POA: Diagnosis not present

## 2015-03-23 DIAGNOSIS — J310 Chronic rhinitis: Secondary | ICD-10-CM

## 2015-03-24 ENCOUNTER — Other Ambulatory Visit (INDEPENDENT_AMBULATORY_CARE_PROVIDER_SITE_OTHER): Payer: Self-pay | Admitting: Otolaryngology

## 2015-03-24 DIAGNOSIS — J3489 Other specified disorders of nose and nasal sinuses: Secondary | ICD-10-CM

## 2015-03-30 ENCOUNTER — Ambulatory Visit (HOSPITAL_COMMUNITY)
Admission: RE | Admit: 2015-03-30 | Discharge: 2015-03-30 | Disposition: A | Discharge status: Home / Self Care | Payer: Medicaid Other | Source: Ambulatory Visit | Attending: Otolaryngology | Admitting: Otolaryngology

## 2015-03-30 DIAGNOSIS — J3489 Other specified disorders of nose and nasal sinuses: Secondary | ICD-10-CM | POA: Diagnosis present

## 2015-04-06 ENCOUNTER — Ambulatory Visit (INDEPENDENT_AMBULATORY_CARE_PROVIDER_SITE_OTHER): Payer: Medicaid Other | Admitting: Otolaryngology

## 2015-04-06 DIAGNOSIS — D140 Benign neoplasm of middle ear, nasal cavity and accessory sinuses: Secondary | ICD-10-CM

## 2015-04-07 ENCOUNTER — Other Ambulatory Visit: Payer: Self-pay | Admitting: Otolaryngology

## 2015-04-10 ENCOUNTER — Emergency Department (HOSPITAL_COMMUNITY)
Admission: EM | Admit: 2015-04-10 | Discharge: 2015-04-10 | Disposition: A | Discharge status: Home / Self Care | Payer: Medicaid Other | Attending: Emergency Medicine | Admitting: Emergency Medicine

## 2015-04-10 ENCOUNTER — Encounter (HOSPITAL_COMMUNITY): Payer: Self-pay | Admitting: Emergency Medicine

## 2015-04-10 DIAGNOSIS — F329 Major depressive disorder, single episode, unspecified: Secondary | ICD-10-CM | POA: Insufficient documentation

## 2015-04-10 DIAGNOSIS — M25552 Pain in left hip: Secondary | ICD-10-CM | POA: Diagnosis present

## 2015-04-10 DIAGNOSIS — E669 Obesity, unspecified: Secondary | ICD-10-CM | POA: Diagnosis not present

## 2015-04-10 DIAGNOSIS — M1612 Unilateral primary osteoarthritis, left hip: Secondary | ICD-10-CM | POA: Diagnosis not present

## 2015-04-10 DIAGNOSIS — Z79899 Other long term (current) drug therapy: Secondary | ICD-10-CM | POA: Insufficient documentation

## 2015-04-10 DIAGNOSIS — Z87828 Personal history of other (healed) physical injury and trauma: Secondary | ICD-10-CM | POA: Insufficient documentation

## 2015-04-10 DIAGNOSIS — I10 Essential (primary) hypertension: Secondary | ICD-10-CM | POA: Insufficient documentation

## 2015-04-10 DIAGNOSIS — F419 Anxiety disorder, unspecified: Secondary | ICD-10-CM | POA: Diagnosis not present

## 2015-04-10 DIAGNOSIS — E785 Hyperlipidemia, unspecified: Secondary | ICD-10-CM | POA: Diagnosis not present

## 2015-04-10 DIAGNOSIS — Z8719 Personal history of other diseases of the digestive system: Secondary | ICD-10-CM | POA: Diagnosis not present

## 2015-04-10 MED ORDER — ONDANSETRON 4 MG PO TBDP
ORAL_TABLET | ORAL | Status: AC
  Administered 2015-04-10: 4 mg
  Filled 2015-04-10: qty 1

## 2015-04-10 MED ORDER — MORPHINE SULFATE 4 MG/ML IJ SOLN
8.0000 mg | Freq: Once | INTRAMUSCULAR | Status: AC
  Administered 2015-04-10: 8 mg via INTRAMUSCULAR
  Filled 2015-04-10: qty 2

## 2015-04-10 MED ORDER — DEXAMETHASONE SODIUM PHOSPHATE 4 MG/ML IJ SOLN
8.0000 mg | Freq: Once | INTRAMUSCULAR | Status: AC
  Administered 2015-04-10: 8 mg via INTRAMUSCULAR
  Filled 2015-04-10: qty 2

## 2015-04-10 MED ORDER — PROCHLORPERAZINE MALEATE 5 MG PO TABS
10.0000 mg | ORAL_TABLET | Freq: Four times a day (QID) | ORAL | Status: DC | PRN
  Administered 2015-04-10: 10 mg via ORAL
  Filled 2015-04-10: qty 2

## 2015-04-10 MED ORDER — DICLOFENAC SODIUM 75 MG PO TBEC: 75.0000 mg | DELAYED_RELEASE_TABLET | Freq: Two times a day (BID) | ORAL | Status: DC

## 2015-04-10 NOTE — ED Notes (Signed)
H. Bryant, PA at bedside. 
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
## 2015-04-10 NOTE — ED Notes (Signed)
Cont to have nausea, H Bryant Pa  Notified and med given

## 2015-04-10 NOTE — Discharge Instructions (Signed)
Heating pad to the left hip may be helpful. Use diclofenac 2 times daily with food.May use tylenol between the diclofenac doses. Please see Dr Berdine Addison to discuss pain management of this ongoing hip problem. Hip Pain Your hip is the joint between your upper legs and your lower pelvis. The bones, cartilage, tendons, and muscles of your hip joint perform a lot of work each day supporting your body weight and allowing you to move around. Hip pain can range from a minor ache to severe pain in one or both of your hips. Pain may be felt on the inside of the hip joint near the groin, or the outside near the buttocks and upper thigh. You may have swelling or stiffness as well.  HOME CARE INSTRUCTIONS   Take medicines only as directed by your health care provider.  Apply ice to the injured area:  Put ice in a plastic bag.  Place a towel between your skin and the bag.  Leave the ice on for 15-20 minutes at a time, 3-4 times a day.  Keep your leg raised (elevated) when possible to lessen swelling.  Avoid activities that cause pain.  Follow specific exercises as directed by your health care provider.  Sleep with a pillow between your legs on your most comfortable side.  Record how often you have hip pain, the location of the pain, and what it feels like. SEEK MEDICAL CARE IF:   You are unable to put weight on your leg.  Your hip is red or swollen or very tender to touch.  Your pain or swelling continues or worsens after 1 week.  You have increasing difficulty walking.  You have a fever. SEEK IMMEDIATE MEDICAL CARE IF:   You have fallen.  You have a sudden increase in pain and swelling in your hip. MAKE SURE YOU:   Understand these instructions.  Will watch your condition.  Will get help right away if you are not doing well or get worse. Document Released: 04/03/2010 Document Revised: 02/28/2014 Document Reviewed: 06/10/2013 [facility] Patient Information 2015 [place], [place]. This  information is not intended to replace advice given to you by your health care provider. Make sure you discuss any questions you have with your health care provider.

## 2015-04-11 NOTE — ED Notes (Signed)
PT called to ED to request new prescription because Diclofenac is not covered by her insurance. Verbal order for naproxen 500mg  po BID given.

## 2015-04-14 ENCOUNTER — Encounter (HOSPITAL_BASED_OUTPATIENT_CLINIC_OR_DEPARTMENT_OTHER): Payer: Self-pay | Admitting: *Deleted

## 2015-04-18 ENCOUNTER — Ambulatory Visit (HOSPITAL_BASED_OUTPATIENT_CLINIC_OR_DEPARTMENT_OTHER)
Admission: RE | Admit: 2015-04-18 | Discharge: 2015-04-18 | Disposition: A | Discharge status: Home / Self Care | Payer: Medicaid Other | Source: Ambulatory Visit | Attending: Otolaryngology | Admitting: Otolaryngology

## 2015-04-18 ENCOUNTER — Encounter (HOSPITAL_BASED_OUTPATIENT_CLINIC_OR_DEPARTMENT_OTHER): Payer: Self-pay

## 2015-04-18 ENCOUNTER — Ambulatory Visit (HOSPITAL_BASED_OUTPATIENT_CLINIC_OR_DEPARTMENT_OTHER): Payer: Medicaid Other | Admitting: Anesthesiology

## 2015-04-18 ENCOUNTER — Encounter (HOSPITAL_BASED_OUTPATIENT_CLINIC_OR_DEPARTMENT_OTHER)
Admission: RE | Disposition: A | Discharge status: Home / Self Care | Payer: Self-pay | Source: Ambulatory Visit | Attending: Otolaryngology

## 2015-04-18 DIAGNOSIS — I10 Essential (primary) hypertension: Secondary | ICD-10-CM | POA: Diagnosis not present

## 2015-04-18 DIAGNOSIS — J3489 Other specified disorders of nose and nasal sinuses: Secondary | ICD-10-CM | POA: Diagnosis not present

## 2015-04-18 DIAGNOSIS — M1990 Unspecified osteoarthritis, unspecified site: Secondary | ICD-10-CM | POA: Diagnosis not present

## 2015-04-18 DIAGNOSIS — F419 Anxiety disorder, unspecified: Secondary | ICD-10-CM | POA: Insufficient documentation

## 2015-04-18 DIAGNOSIS — J343 Hypertrophy of nasal turbinates: Secondary | ICD-10-CM | POA: Diagnosis not present

## 2015-04-18 DIAGNOSIS — Z6836 Body mass index (BMI) 36.0-36.9, adult: Secondary | ICD-10-CM | POA: Diagnosis not present

## 2015-04-18 DIAGNOSIS — R0981 Nasal congestion: Secondary | ICD-10-CM | POA: Insufficient documentation

## 2015-04-18 DIAGNOSIS — R220 Localized swelling, mass and lump, head: Secondary | ICD-10-CM | POA: Diagnosis not present

## 2015-04-18 DIAGNOSIS — F329 Major depressive disorder, single episode, unspecified: Secondary | ICD-10-CM | POA: Insufficient documentation

## 2015-04-18 HISTORY — PX: NASAL ENDOSCOPY: SHX6577

## 2015-04-18 LAB — POCT HEMOGLOBIN-HEMACUE: HEMOGLOBIN: 12.1 g/dL (ref 12.0–15.0)

## 2015-04-18 SURGERY — ENDOSCOPY, NOSE
Anesthesia: General | Laterality: Bilateral

## 2015-04-18 MED ORDER — PROPOFOL 10 MG/ML IV BOLUS
INTRAVENOUS | Status: DC | PRN
  Administered 2015-04-18: 150 mg via INTRAVENOUS

## 2015-04-18 MED ORDER — PROMETHAZINE HCL 25 MG/ML IJ SOLN: 6.2500 mg | INTRAMUSCULAR | Status: DC | PRN

## 2015-04-18 MED ORDER — OXYCODONE HCL 5 MG PO TABS
5.0000 mg | ORAL_TABLET | Freq: Once | ORAL | Status: AC | PRN
  Administered 2015-04-18: 5 mg via ORAL

## 2015-04-18 MED ORDER — LACTATED RINGERS IV SOLN
INTRAVENOUS | Status: DC
  Administered 2015-04-18: 09:00:00 via INTRAVENOUS

## 2015-04-18 MED ORDER — OXYCODONE HCL 5 MG PO TABS: 5.0000 mg | ORAL_TABLET | ORAL | Status: DC | PRN

## 2015-04-18 MED ORDER — MIDAZOLAM HCL 2 MG/2ML IJ SOLN
1.0000 mg | INTRAMUSCULAR | Status: DC | PRN
  Administered 2015-04-18: 2 mg via INTRAVENOUS

## 2015-04-18 MED ORDER — OXYCODONE HCL 5 MG/5ML PO SOLN: 5.0000 mg | Freq: Once | ORAL | Status: AC | PRN

## 2015-04-18 MED ORDER — LIDOCAINE HCL (CARDIAC) 20 MG/ML IV SOLN
INTRAVENOUS | Status: DC | PRN
  Administered 2015-04-18: 40 mg via INTRAVENOUS

## 2015-04-18 MED ORDER — OXYCODONE HCL 5 MG PO TABS
ORAL_TABLET | ORAL | Status: AC
  Filled 2015-04-18: qty 1

## 2015-04-18 MED ORDER — MIDAZOLAM HCL 2 MG/2ML IJ SOLN
INTRAMUSCULAR | Status: AC
  Filled 2015-04-18: qty 2

## 2015-04-18 MED ORDER — GLYCOPYRROLATE 0.2 MG/ML IJ SOLN: 0.2000 mg | Freq: Once | INTRAMUSCULAR | Status: DC | PRN

## 2015-04-18 MED ORDER — ONDANSETRON HCL 4 MG/2ML IJ SOLN
INTRAMUSCULAR | Status: DC | PRN
  Administered 2015-04-18: 4 mg via INTRAVENOUS

## 2015-04-18 MED ORDER — HYDROMORPHONE HCL 1 MG/ML IJ SOLN
0.2500 mg | INTRAMUSCULAR | Status: DC | PRN
  Administered 2015-04-18 (×2): 0.5 mg via INTRAVENOUS

## 2015-04-18 MED ORDER — CEFAZOLIN SODIUM-DEXTROSE 2-3 GM-% IV SOLR
INTRAVENOUS | Status: DC | PRN
  Administered 2015-04-18: 2 g via INTRAVENOUS

## 2015-04-18 MED ORDER — OXYMETAZOLINE HCL 0.05 % NA SOLN
NASAL | Status: DC | PRN
  Administered 2015-04-18: 1

## 2015-04-18 MED ORDER — FENTANYL CITRATE (PF) 100 MCG/2ML IJ SOLN
50.0000 ug | INTRAMUSCULAR | Status: AC | PRN
  Administered 2015-04-18 (×2): 25 ug via INTRAVENOUS
  Administered 2015-04-18: 50 ug via INTRAVENOUS

## 2015-04-18 MED ORDER — HYDROMORPHONE HCL 1 MG/ML IJ SOLN
INTRAMUSCULAR | Status: AC
  Filled 2015-04-18: qty 1

## 2015-04-18 MED ORDER — FENTANYL CITRATE (PF) 100 MCG/2ML IJ SOLN
INTRAMUSCULAR | Status: AC
  Filled 2015-04-18: qty 4

## 2015-04-18 MED ORDER — SCOPOLAMINE 1 MG/3DAYS TD PT72: 1.0000 | MEDICATED_PATCH | Freq: Once | TRANSDERMAL | Status: DC | PRN

## 2015-04-18 SURGICAL SUPPLY — 47 items
ATTRACTOMAT 16X20 MAGNETIC DRP (DRAPES) IMPLANT
BLADE TRICUT ROTATE M4 4 5PK (BLADE) IMPLANT
BLADE TRICUT ROTATE M4 4MM 5PK (BLADE)
CANISTER SUC SOCK COL 7IN (MISCELLANEOUS) ×6 IMPLANT
CANISTER SUCT 1200ML W/VALVE (MISCELLANEOUS) ×3 IMPLANT
COAGULATOR SUCT 6 FR SWTCH (ELECTROSURGICAL)
COAGULATOR SUCT 8FR VV (MISCELLANEOUS) IMPLANT
COAGULATOR SUCT SWTCH 10FR 6 (ELECTROSURGICAL) IMPLANT
DECANTER SPIKE VIAL GLASS SM (MISCELLANEOUS) IMPLANT
DRSG NASAL KENNEDY LMNT 8CM (GAUZE/BANDAGES/DRESSINGS) IMPLANT
DRSG NASOPORE 8CM (GAUZE/BANDAGES/DRESSINGS) IMPLANT
DRSG TELFA 3X8 NADH (GAUZE/BANDAGES/DRESSINGS) IMPLANT
ELECT REM PT RETURN 9FT ADLT (ELECTROSURGICAL)
ELECTRODE REM PT RTRN 9FT ADLT (ELECTROSURGICAL) IMPLANT
GLOVE BIO SURGEON STRL SZ7.5 (GLOVE) ×3 IMPLANT
GLOVE BIOGEL PI IND STRL 7.0 (GLOVE) IMPLANT
GLOVE BIOGEL PI IND STRL 7.5 (GLOVE) IMPLANT
GLOVE BIOGEL PI INDICATOR 7.0 (GLOVE) ×2
GLOVE BIOGEL PI INDICATOR 7.5 (GLOVE) ×2
GLOVE ECLIPSE 6.5 STRL STRAW (GLOVE) ×2 IMPLANT
GLOVE SURG SS PI 7.0 STRL IVOR (GLOVE) ×2 IMPLANT
GOWN STRL REUS W/ TWL LRG LVL3 (GOWN DISPOSABLE) ×2 IMPLANT
GOWN STRL REUS W/TWL LRG LVL3 (GOWN DISPOSABLE) ×9
HEMOSTAT SURGICEL 2X14 (HEMOSTASIS) IMPLANT
IV NS 500ML (IV SOLUTION)
IV NS 500ML BAXH (IV SOLUTION) ×1 IMPLANT
NDL HYPO 25X1 1.5 SAFETY (NEEDLE) ×1 IMPLANT
NEEDLE HYPO 25X1 1.5 SAFETY (NEEDLE) ×3 IMPLANT
NS IRRIG 1000ML POUR BTL (IV SOLUTION) IMPLANT
PACK BASIN DAY SURGERY FS (CUSTOM PROCEDURE TRAY) ×3 IMPLANT
PACK ENT DAY SURGERY (CUSTOM PROCEDURE TRAY) ×3 IMPLANT
PAD DRESSING TELFA 3X8 NADH (GAUZE/BANDAGES/DRESSINGS) IMPLANT
SET EXT MALE ROTATING LL 32IN (MISCELLANEOUS) IMPLANT
SET IV EXT TUBING FEMALE 31 (MISCELLANEOUS) IMPLANT
SLEEVE SCD COMPRESS KNEE MED (MISCELLANEOUS) IMPLANT
SOLUTION BUTLER CLEAR DIP (MISCELLANEOUS) ×4 IMPLANT
SPLINT NASAL AIRWAY SILICONE (MISCELLANEOUS) IMPLANT
SPONGE GAUZE 2X2 8PLY STER LF (GAUZE/BANDAGES/DRESSINGS) ×1
SPONGE GAUZE 2X2 8PLY STRL LF (GAUZE/BANDAGES/DRESSINGS) ×2 IMPLANT
SPONGE NEURO XRAY DETECT 1X3 (DISPOSABLE) ×3 IMPLANT
SUT CHROMIC 4 0 P 3 18 (SUTURE) IMPLANT
SUT ETHILON 3 0 PS 1 (SUTURE) IMPLANT
SUT PLAIN 4 0 ~~LOC~~ 1 (SUTURE) IMPLANT
TOWEL OR 17X24 6PK STRL BLUE (TOWEL DISPOSABLE) ×3 IMPLANT
TUBE CONNECTING 20'X1/4 (TUBING) ×1
TUBE CONNECTING 20X1/4 (TUBING) ×2 IMPLANT
YANKAUER SUCT BULB TIP NO VENT (SUCTIONS) IMPLANT

## 2015-04-18 NOTE — H&P (Signed)
Cc: Bilateral recurrent nasal masses  HPI: The patient is a 59 year old female who returns today for her follow-up evaluation.  The patient has a history of recurrent nasal mass.  At her last visit, a new bony mass was noted within the left nasal cavity, inferior to the left inferior turbinate.  A significant amount of crusting was also noted within the right nasal cavity.  A smaller bony regrowth was also noted around the right middle turbinate.  The patient subsequently underwent a sinus CT scan.  The CT confirmed the bony regrowth.  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 The patient returns complaining of persistent nasal congestion, especially on the left side. She is interested in having the bony regrowth removed.   Exam The nasal cavities were decongested and anesthetised with a combination of oxymetazoline and 4% lidocaine solution. The flexible scope was inserted into the right nasal cavity. Endoscopy of the inferior and middle meatus was performed. The patient is noted to have a small bony regrowth on the middle turbinate. Olfactory cleft was clear. Nasopharynx was clear. Turbinates were partially resected. The procedure was repeated on the contralateral side. A new bony mass was noted within the left nasal cavity, inferior to the left inferior turbinate.  The patient tolerated the procedure well. Instructions were given to avoid eating or drinking for 2 hours.    Assessment:  The patient continues to have bony masses within the left inferior nasal cavity and the right middle turbinate.  No acute infection or purulent drainage is noted.   Plan: 1.  The nasal endoscopy findings and the CT images are reviewed with the patient.  2.  The patient is instructed to perform saline irrigation prn crusting.  3.  We will proceed with endoscopic removal of her nasal masses under general anesthesia.

## 2015-04-18 NOTE — Anesthesia Procedure Notes (Signed)
Procedure Name: LMA Insertion Date/Time: 04/18/2015 9:40 AM Performed by: Maryella Shivers Pre-anesthesia Checklist: Patient identified, Emergency Drugs available, Suction available and Patient being monitored Patient Re-evaluated:Patient Re-evaluated prior to inductionOxygen Delivery Method: Circle System Utilized Preoxygenation: Pre-oxygenation with 100% oxygen Intubation Type: IV induction Ventilation: Mask ventilation without difficulty LMA: LMA inserted LMA Size: 4.0 Number of attempts: 1 Airway Equipment and Method: Bite block Placement Confirmation: positive ETCO2 Tube secured with: Tape Dental Injury: Teeth and Oropharynx as per pre-operative assessment

## 2015-04-18 NOTE — Op Note (Signed)
DATE OF PROCEDURE:  04/18/2015                              OPERATIVE REPORT  SURGEON:  Leta Baptist, MD  PREOPERATIVE DIAGNOSES: 1. Bilateral nasal mass.  POSTOPERATIVE DIAGNOSES: 1. Bilateral nasal mass.  PROCEDURE PERFORMED:  Bilateral endoscopic nasal mass removal (CPT [id number])  ANESTHESIA:  General laryngeal mask anesthesia.  COMPLICATIONS:  None.  ESTIMATED BLOOD LOSS:  Minimal.  INDICATION FOR PROCEDURE:  Rachel [name] is a 59 y.o. female with a history of recurrent nasal masses. She previously underwent endoscopic removal of her right nasal mass on 2 separate locations. The pathology was consistent with benign fibrous soft tissue mass with a giant cell reaction. According to the patient, she was doing well until 2 months ago, when she began to experience increasing bilateral nasal obstruction. On examination, she was noted to have a large bony mass inferior to the left inferior turbinate. A recurrent nasal mass was also noted within the right nasal cavity, adjacent to the right middle turbinate. Her CT scan showed no other sinus abnormality. Based on the above findings, the decision was made for the patient to undergo the above stated procedure. Likelihood of success in reducing symptoms was also discussed.  The risks, benefits, alternatives, and details of the procedure were discussed with the mother.  Questions were invited and answered.  Informed consent was obtained.  DESCRIPTION:  The patient was taken to the operating room and placed supine on the operating table.  General endotracheal tube anesthesia was administered by the anesthesiologist.  The patient was positioned and prepped and draped in a standard fashion for nasal surgery. Pledgets soaked with Afrin were placed in both nasal cavities for vasoconstriction. The pledgets were subsequently removed. Endoscopic evaluation of both nasal cavities revealed bony masses within both nasal cavities. Attention was first focused on  the left side. Using an osteotome, the bony left nasal mass was removed from the lateral nasal wall. The entire mass was sent to the pathology department for permanent histologic identification. Hemostasis was achieved with a suction electrocautery device. The same procedure was then repeated on the right side. The right bony nasal mass was chiseled from the nasal septum and the lateral nasal wall. Hemostasis was also achieved with the suction electrocautery device. The care of the patient was turned over to the anesthesiologist.  The patient was awakened from anesthesia without difficulty.  She was extubated and transferred to the recovery room in good condition.  OPERATIVE FINDINGS:  Bilateral bony nasal masses.  SPECIMEN:  Bilateral bony nasal masses.  FOLLOWUP CARE:  The patient will be discharged home once awake and alert.  She will be placed on oxycodone prn pain.  The patient will follow up in my office in approximately 1 week.  Christoher Drudge,SUI W 04/18/2015 10:01 AM

## 2015-04-18 NOTE — Anesthesia Preprocedure Evaluation (Addendum)
Anesthesia Evaluation  Patient identified by MRN, date of birth, ID band Patient awake    Reviewed: Allergy & Precautions, NPO status , Patient's Chart, lab work & pertinent test results  Airway Mallampati: II  TM Distance: >3 FB Neck ROM: Limited    Dental   Pulmonary neg pulmonary ROS,  breath sounds clear to auscultation        Cardiovascular hypertension, Pt. on medications Rhythm:Regular Rate:Normal     Neuro/Psych Anxiety Depression negative neurological ROS     GI/Hepatic negative GI ROS, Neg liver ROS,   Endo/Other  Morbid obesity  Renal/GU negative Renal ROS     Musculoskeletal  (+) Arthritis -,   Abdominal   Peds  Hematology negative hematology ROS (+)   Anesthesia Other Findings   Reproductive/Obstetrics                            Anesthesia Physical Anesthesia Plan  ASA: II  Anesthesia Plan: General   Post-op Pain Management:    Induction: Intravenous  Airway Management Planned: Oral ETT  Additional Equipment:   Intra-op Plan:   Post-operative Plan: Extubation in OR  Informed Consent: I have reviewed the patients History and Physical, chart, labs and discussed the procedure including the risks, benefits and alternatives for the proposed anesthesia with the patient or authorized representative who has indicated his/her understanding and acceptance.   Dental advisory given  Plan Discussed with: CRNA  Anesthesia Plan Comments:         Anesthesia Quick Evaluation

## 2015-04-18 NOTE — Anesthesia Postprocedure Evaluation (Signed)
  Anesthesia Post-op Note  Patient: Rachel [name]  Procedure(s) Performed: Procedure(s): BILATERAL ENDOSCOPIC NASAL MASS  REMOVAL  (Bilateral)  Patient Location: PACU  Anesthesia Type:General  Level of Consciousness: awake, alert  and oriented  Airway and Oxygen Therapy: Patient Spontanous Breathing  Post-op Pain: mild  Post-op Assessment: Post-op Vital signs reviewed              Post-op Vital Signs: Reviewed  Last Vitals:  Filed Vitals:   04/18/15 1110  BP: 147/49  Pulse: 83  Temp: 36.4 C  Resp: 18    Complications: No apparent anesthesia complications

## 2015-04-18 NOTE — Discharge Instructions (Addendum)

## 2015-04-18 NOTE — Transfer of Care (Signed)
Immediate Anesthesia Transfer of Care Note  Patient: Rachel [name]  Procedure(s) Performed: Procedure(s): BILATERAL ENDOSCOPIC NASAL MASS  REMOVAL  (Bilateral)  Patient Location: PACU  Anesthesia Type:General  Level of Consciousness: sedated  Airway & Oxygen Therapy: Patient Spontanous Breathing and Patient connected to face mask oxygen  Post-op Assessment: Report given to RN and Post -op Vital signs reviewed and stable  Post vital signs: Reviewed and stable  Last Vitals:  Filed Vitals:   04/18/15 1006  BP:   Pulse: 88  Temp:   Resp: 8    Complications: No apparent anesthesia complications

## 2015-04-19 ENCOUNTER — Encounter (HOSPITAL_BASED_OUTPATIENT_CLINIC_OR_DEPARTMENT_OTHER): Payer: Self-pay | Admitting: Otolaryngology

## 2015-04-27 ENCOUNTER — Ambulatory Visit (INDEPENDENT_AMBULATORY_CARE_PROVIDER_SITE_OTHER): Payer: Medicaid Other | Admitting: Otolaryngology

## 2015-05-04 ENCOUNTER — Ambulatory Visit (INDEPENDENT_AMBULATORY_CARE_PROVIDER_SITE_OTHER): Payer: Medicaid Other | Admitting: Otolaryngology

## 2015-05-04 DIAGNOSIS — D140 Benign neoplasm of middle ear, nasal cavity and accessory sinuses: Secondary | ICD-10-CM | POA: Diagnosis not present

## 2015-05-09 ENCOUNTER — Other Ambulatory Visit (HOSPITAL_COMMUNITY): Payer: Self-pay | Admitting: Family Medicine

## 2015-05-09 DIAGNOSIS — Z1231 Encounter for screening mammogram for malignant neoplasm of breast: Secondary | ICD-10-CM

## 2015-05-18 ENCOUNTER — Ambulatory Visit (HOSPITAL_COMMUNITY)
Admission: RE | Admit: 2015-05-18 | Discharge: 2015-05-18 | Disposition: A | Discharge status: Home / Self Care | Payer: Medicaid Other | Source: Ambulatory Visit | Attending: Family Medicine | Admitting: Family Medicine

## 2015-05-18 DIAGNOSIS — Z1231 Encounter for screening mammogram for malignant neoplasm of breast: Secondary | ICD-10-CM | POA: Insufficient documentation

## 2015-05-22 ENCOUNTER — Ambulatory Visit (INDEPENDENT_AMBULATORY_CARE_PROVIDER_SITE_OTHER): Payer: Medicaid Other | Admitting: Adult Health

## 2015-05-22 ENCOUNTER — Encounter: Payer: Self-pay | Admitting: Adult Health

## 2015-05-22 VITALS — BP 136/62 | HR 68 | Ht 64.0 in | Wt 252.0 lb

## 2015-05-22 DIAGNOSIS — Z01419 Encounter for gynecological examination (general) (routine) without abnormal findings: Secondary | ICD-10-CM

## 2015-05-22 DIAGNOSIS — E669 Obesity, unspecified: Secondary | ICD-10-CM | POA: Insufficient documentation

## 2015-05-22 DIAGNOSIS — Z0000 Encounter for general adult medical examination without abnormal findings: Secondary | ICD-10-CM | POA: Diagnosis not present

## 2015-05-22 DIAGNOSIS — Z1212 Encounter for screening for malignant neoplasm of rectum: Secondary | ICD-10-CM

## 2015-05-22 LAB — HEMOCCULT GUIAC POC 1CARD (OFFICE): Fecal Occult Blood, POC: NEGATIVE

## 2015-05-22 NOTE — Patient Instructions (Addendum)
Pap and physical in  1year Mammogram yearly  Colonoscopy per GI Labs with PCP Calorie Counting for Weight Loss Calories are energy you get from the things you eat and drink. Your body uses this energy to keep you going throughout the day. The number of calories you eat affects your weight. When you eat more calories than your body needs, your body stores the extra calories as fat. When you eat fewer calories than your body needs, your body burns fat to get the energy it needs. Calorie counting means keeping track of how many calories you eat and drink each day. If you make sure to eat fewer calories than your body needs, you should lose weight. In order for calorie counting to work, you will need to eat the number of calories that are right for you in a day to lose a healthy amount of weight per week. A healthy amount of weight to lose per week is usually 1-2 lb (0.5-0.9 kg). A dietitian can determine how many calories you need in a day and give you suggestions on how to reach your calorie goal.  WHAT IS MY MY PLAN? My goal is to have ____1500______ calories per day.  If I have this many calories per day, I should lose around ___1-2_______ pounds per week. WHAT DO I NEED TO KNOW ABOUT CALORIE COUNTING? In order to meet your daily calorie goal, you will need to:  Find out how many calories are in each food you would like to eat. Try to do this before you eat.  Decide how much of the food you can eat.  Write down what you ate and how many calories it had. Doing this is called keeping a food log. WHERE DO I FIND CALORIE INFORMATION? The number of calories in a food can be found on a Nutrition Facts label. Note that all the information on a label is based on a specific serving of the food. If a food does not have a Nutrition Facts label, try to look up the calories online or ask your dietitian for help. HOW DO I DECIDE HOW MUCH TO EAT? To decide how much of the food you can eat, you will need to  consider both the number of calories in one serving and the size of one serving. This information can be found on the Nutrition Facts label. If a food does not have a Nutrition Facts label, look up the information online or ask your dietitian for help. Remember that calories are listed per serving. If you choose to have more than one serving of a food, you will have to multiply the calories per serving by the amount of servings you plan to eat. For example, the label on a package of bread might say that a serving size is 1 slice and that there are 90 calories in a serving. If you eat 1 slice, you will have eaten 90 calories. If you eat 2 slices, you will have eaten 180 calories. HOW DO I KEEP A FOOD LOG? After each meal, record the following information in your food log:  What you ate.  How much of it you ate.  How many calories it had.  Then, add up your calories. Keep your food log near you, such as in a small notebook in your pocket. Another option is to use a mobile app or website. Some programs will calculate calories for you and show you how many calories you have left each time you add an item  to the log. WHAT ARE SOME CALORIE COUNTING TIPS?  Use your calories on foods and drinks that will fill you up and not leave you hungry. Some examples of this include foods like nuts and nut butters, vegetables, lean proteins, and high-fiber foods (more than 5 g fiber per serving).  Eat nutritious foods and avoid empty calories. Empty calories are calories you get from foods or beverages that do not have many nutrients, such as candy and soda. It is better to have a nutritious high-calorie food (such as an avocado) than a food with few nutrients (such as a bag of chips).  Know how many calories are in the foods you eat most often. This way, you do not have to look up how many calories they have each time you eat them.  Look out for foods that may seem like low-calorie foods but are really  high-calorie foods, such as baked goods, soda, and fat-free candy.  Pay attention to calories in drinks. Drinks such as sodas, specialty coffee drinks, alcohol, and juices have a lot of calories yet do not fill you up. Choose low-calorie drinks like water and diet drinks.  Focus your calorie counting efforts on higher calorie items. Logging the calories in a garden salad that contains only vegetables is less important than calculating the calories in a milk shake.  Find a way of tracking calories that works for you. Get creative. Most people who are successful find ways to keep track of how much they eat in a day, even if they do not count every calorie. WHAT ARE SOME PORTION CONTROL TIPS?  Know how many calories are in a serving. This will help you know how many servings of a certain food you can have.  Use a measuring cup to measure serving sizes. This is helpful when you start out. With time, you will be able to estimate serving sizes for some foods.  Take some time to put servings of different foods on your favorite plates, bowls, and cups so you know what a serving looks like.  Try not to eat straight from a bag or box. Doing this can lead to overeating. Put the amount you would like to eat in a cup or on a plate to make sure you are eating the right portion.  Use smaller plates, glasses, and bowls to prevent overeating. This is a quick and easy way to practice portion control. If your plate is smaller, less food can fit on it.  Try not to multitask while eating, such as watching TV or using your computer. If it is time to eat, sit down at a table and enjoy your food. Doing this will help you to start recognizing when you are full. It will also make you more aware of what and how much you are eating. HOW CAN I CALORIE COUNT WHEN EATING OUT?  Ask for smaller portion sizes or child-sized portions.  Consider sharing an entree and sides instead of getting your own entree.  If you get your  own entree, eat only half. Ask for a box at the beginning of your meal and put the rest of your entree in it so you are not tempted to eat it.  Look for the calories on the menu. If calories are listed, choose the lower calorie options.  Choose dishes that include vegetables, fruits, whole grains, low-fat dairy products, and lean protein. Focusing on smart food choices from each of the 5 food groups can help you stay on  track at restaurants.  Choose items that are boiled, broiled, grilled, or steamed.  Choose water, milk, unsweetened iced tea, or other drinks without added sugars. If you want an alcoholic beverage, choose a lower calorie option. For example, a regular margarita can have up to 700 calories and a glass of wine has around 150.  Stay away from items that are buttered, battered, fried, or served with cream sauce. Items labeled "crispy" are usually fried, unless stated otherwise.  Ask for dressings, sauces, and syrups on the side. These are usually very high in calories, so do not eat much of them.  Watch out for salads. Many people think salads are a healthy option, but this is often not the case. Many salads come with bacon, fried chicken, lots of cheese, fried chips, and dressing. All of these items have a lot of calories. If you want a salad, choose a garden salad and ask for grilled meats or steak. Ask for the dressing on the side, or ask for olive oil and vinegar or lemon to use as dressing.  Estimate how many servings of a food you are given. For example, a serving of cooked rice is  cup or about the size of half a tennis ball or one cupcake wrapper. Knowing serving sizes will help you be aware of how much food you are eating at restaurants. The list below tells you how big or small some common portion sizes are based on everyday objects.  1 oz--4 stacked dice.  3 oz--1 deck of cards.  1 tsp--1 dice.  1 Tbsp-- a Ping-Pong ball.  2 Tbsp--1 Ping-Pong ball.   cup--1  tennis ball or 1 cupcake wrapper.  1 cup--1 baseball. Document Released: 10/14/2005 Document Revised: 02/28/2014 Document Reviewed: 08/19/2013 [facility] Patient Information 2015 [place], [place]. This information is not intended to replace advice given to you by your health care provider. Make sure you discuss any questions you have with your health care provider.

## 2015-05-22 NOTE — Progress Notes (Signed)
Patient ID: Rachel [name], female   DOB: 11/13/1955, 59 y.o.   MRN: [id number] History of Present Illness: Rachel [name] is a 59 year old black female in for well woman gyn exam, she had a normal pap with negative HPV 05/20/13.She has been in hospital for colitis, had colonoscopy in hospital and got lots of IV antibiotics she says. PCP is Dr Berdine Addison.   Current Medications, Allergies, Past Medical History, Past Surgical History, Family History and Social History were reviewed in [employer] record.     Review of Systems:  Patient denies any headaches, hearing loss, fatigue, blurred vision, shortness of breath, chest pain, abdominal pain, problems with bowel movements, urination(she does say she is going often, but takes fluid pill), or intercourse(not having sex). No mood swings.She has body aches and wants to lose weight.   Physical Exam:BP 136/62 mmHg  Pulse 68  Ht 5\' 4"  (1.626 m)  Wt 252 lb (114.306 kg)  BMI 43.23 kg/m2 General:  Well developed, well nourished, no acute distress Skin:  Warm and dry Neck:  Midline trachea, normal thyroid, good ROM, no lymphadenopathy Lungs; Clear to auscultation bilaterally Breast:  No dominant palpable mass, retraction, or nipple discharge Cardiovascular: Regular rate and rhythm Abdomen:  Soft, non tender, no hepatosplenomegaly,obese Pelvic:  External genitalia is normal in appearance, no lesions.  The vagina is normal in appearance. Urethra has no lesions or masses. The cervix is smooth.  Uterus is felt to be normal size, shape, and contour.  No adnexal masses or tenderness noted.Bladder is non tender, no masses felt. Rectal: Good sphincter tone, no polyps, internal  hemorrhoids felt.  Hemoccult negative. Extremities/musculoskeletal:  No swelling or varicosities noted, no clubbing or cyanosis Psych:  No mood changes, alert and cooperative,seems happy   Impression: Well woman gyn exam no pap Obesity     Plan: Pap and  physical in 1 year Mammogram yearly Labs with PCP Colonoscopy per GI Try in clean, lots of fruit,veggies and lean meats, increase water,decrease sodas, no processed or artificial foods, and review handout on weight loss

## 2015-06-01 ENCOUNTER — Ambulatory Visit (INDEPENDENT_AMBULATORY_CARE_PROVIDER_SITE_OTHER): Payer: Medicaid Other | Admitting: Otolaryngology

## 2015-06-07 ENCOUNTER — Encounter (HOSPITAL_COMMUNITY): Payer: Self-pay

## 2015-06-07 ENCOUNTER — Emergency Department (HOSPITAL_COMMUNITY)
Admission: EM | Admit: 2015-06-07 | Discharge: 2015-06-07 | Disposition: A | Discharge status: Home / Self Care | Payer: Medicaid Other | Attending: Emergency Medicine | Admitting: Emergency Medicine

## 2015-06-07 DIAGNOSIS — G8929 Other chronic pain: Secondary | ICD-10-CM | POA: Insufficient documentation

## 2015-06-07 DIAGNOSIS — Z79899 Other long term (current) drug therapy: Secondary | ICD-10-CM | POA: Diagnosis not present

## 2015-06-07 DIAGNOSIS — Z8719 Personal history of other diseases of the digestive system: Secondary | ICD-10-CM | POA: Insufficient documentation

## 2015-06-07 DIAGNOSIS — I10 Essential (primary) hypertension: Secondary | ICD-10-CM | POA: Insufficient documentation

## 2015-06-07 DIAGNOSIS — M25562 Pain in left knee: Secondary | ICD-10-CM | POA: Diagnosis present

## 2015-06-07 DIAGNOSIS — M1990 Unspecified osteoarthritis, unspecified site: Secondary | ICD-10-CM | POA: Diagnosis not present

## 2015-06-07 DIAGNOSIS — F419 Anxiety disorder, unspecified: Secondary | ICD-10-CM | POA: Insufficient documentation

## 2015-06-07 DIAGNOSIS — E669 Obesity, unspecified: Secondary | ICD-10-CM | POA: Diagnosis not present

## 2015-06-07 DIAGNOSIS — E785 Hyperlipidemia, unspecified: Secondary | ICD-10-CM | POA: Diagnosis not present

## 2015-06-07 DIAGNOSIS — F329 Major depressive disorder, single episode, unspecified: Secondary | ICD-10-CM | POA: Insufficient documentation

## 2015-06-07 MED ORDER — TRAMADOL HCL 50 MG PO TABS: 50.0000 mg | ORAL_TABLET | Freq: Four times a day (QID) | ORAL | Status: DC | PRN

## 2015-06-07 MED ORDER — TRAMADOL HCL 50 MG PO TABS
50.0000 mg | ORAL_TABLET | Freq: Once | ORAL | Status: AC
  Administered 2015-06-07: 50 mg via ORAL
  Filled 2015-06-07: qty 1
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
## 2015-06-07 NOTE — Discharge Instructions (Signed)
Arthritis, Nonspecific °Arthritis is pain, redness, warmth, or puffiness (inflammation) of a joint. The joint may be stiff or hurt when you move it. One or more joints may be affected. There are many types of arthritis. Your doctor may not know what type you have right away. The most common cause of arthritis is wear and tear on the joint (osteoarthritis). °HOME CARE  °· Only take medicine as told by your doctor. °· Rest the joint as much as possible. °· Raise (elevate) your joint if it is puffy. °· Use crutches if the painful joint is in your leg. °· Drink enough fluids to keep your pee (urine) clear or pale yellow. °· Follow your doctor's diet instructions. °· Use cold packs for very bad joint pain for 10 to 15 minutes every hour. Ask your doctor if it is okay for you to use hot packs. °· Exercise as told by your doctor. °· Take a warm shower if you have stiffness in the morning. °· Move your sore joints throughout the day. °GET HELP RIGHT AWAY IF:  °· You have a fever. °· You have very bad joint pain, puffiness, or redness. °· You have many joints that are painful and puffy. °· You are not getting better with treatment. °· You have very bad back pain or leg weakness. °· You cannot control when you poop (bowel movement) or pee (urinate). °· You do not feel better in 24 hours or are getting worse. °· You are having side effects from your medicine. °MAKE SURE YOU:  °· Understand these instructions. °· Will watch your condition. °· Will get help right away if you are not doing well or get worse. °Document Released: 01/08/2010 Document Revised: 04/14/2012 Document Reviewed: 01/08/2010 °[employer]® Patient Information ©2015 [employer]. This information is not intended to replace advice given to you by your health care provider. Make sure you discuss any questions you have with your health care provider. ° °

## 2015-06-07 NOTE — ED Notes (Signed)
Pt c/o chronic left knee pain.

## 2015-06-19 ENCOUNTER — Emergency Department (HOSPITAL_COMMUNITY)
Admission: EM | Admit: 2015-06-19 | Discharge: 2015-06-19 | Disposition: A | Discharge status: Home / Self Care | Payer: Medicaid Other | Attending: Emergency Medicine | Admitting: Emergency Medicine

## 2015-06-19 ENCOUNTER — Encounter (HOSPITAL_COMMUNITY): Payer: Self-pay | Admitting: *Deleted

## 2015-06-19 DIAGNOSIS — M1990 Unspecified osteoarthritis, unspecified site: Secondary | ICD-10-CM | POA: Diagnosis not present

## 2015-06-19 DIAGNOSIS — Z79899 Other long term (current) drug therapy: Secondary | ICD-10-CM | POA: Insufficient documentation

## 2015-06-19 DIAGNOSIS — Z8619 Personal history of other infectious and parasitic diseases: Secondary | ICD-10-CM | POA: Diagnosis not present

## 2015-06-19 DIAGNOSIS — Z87828 Personal history of other (healed) physical injury and trauma: Secondary | ICD-10-CM | POA: Diagnosis not present

## 2015-06-19 DIAGNOSIS — Z8639 Personal history of other endocrine, nutritional and metabolic disease: Secondary | ICD-10-CM | POA: Diagnosis not present

## 2015-06-19 DIAGNOSIS — E669 Obesity, unspecified: Secondary | ICD-10-CM | POA: Diagnosis not present

## 2015-06-19 DIAGNOSIS — M25562 Pain in left knee: Secondary | ICD-10-CM | POA: Insufficient documentation

## 2015-06-19 DIAGNOSIS — F329 Major depressive disorder, single episode, unspecified: Secondary | ICD-10-CM | POA: Insufficient documentation

## 2015-06-19 DIAGNOSIS — I10 Essential (primary) hypertension: Secondary | ICD-10-CM | POA: Diagnosis not present

## 2015-06-19 DIAGNOSIS — G8929 Other chronic pain: Secondary | ICD-10-CM | POA: Insufficient documentation

## 2015-06-19 DIAGNOSIS — Z8739 Personal history of other diseases of the musculoskeletal system and connective tissue: Secondary | ICD-10-CM | POA: Insufficient documentation

## 2015-06-19 DIAGNOSIS — F419 Anxiety disorder, unspecified: Secondary | ICD-10-CM | POA: Diagnosis not present

## 2015-06-19 MED ORDER — HYDROMORPHONE HCL 1 MG/ML IJ SOLN
1.0000 mg | Freq: Once | INTRAMUSCULAR | Status: AC
  Administered 2015-06-19: 1 mg via INTRAMUSCULAR
  Filled 2015-06-19: qty 1

## 2015-06-19 MED ORDER — HYDROMORPHONE HCL 1 MG/ML IJ SOLN
INTRAMUSCULAR | Status: AC
  Administered 2015-06-19: 18:00:00
  Filled 2015-06-19: qty 1

## 2015-06-19 MED ORDER — HYDROCODONE-ACETAMINOPHEN 5-325 MG PO TABS: 1.0000 | ORAL_TABLET | ORAL | Status: DC | PRN

## 2015-06-19 NOTE — Discharge Instructions (Signed)
Arthritis, Nonspecific Arthritis is pain, redness, warmth, or puffiness (inflammation) of a joint. The joint may be stiff or hurt when you move it. One or more joints may be affected. There are many types of arthritis. Your doctor may not know what type you have right away. The most common cause of arthritis is wear and tear on the joint (osteoarthritis). HOME CARE   Only take medicine as told by your doctor.  Rest the joint as much as possible.  Raise (elevate) your joint if it is puffy.  Use crutches if the painful joint is in your leg.  Drink enough fluids to keep your pee (urine) clear or pale yellow.  Follow your doctor's diet instructions.  Use cold packs for very bad joint pain for 10 to 15 minutes every hour. Ask your doctor if it is okay for you to use hot packs.  Exercise as told by your doctor.  Take a warm shower if you have stiffness in the morning.  Move your sore joints throughout the day. GET HELP RIGHT AWAY IF:   You have a fever.  You have very bad joint pain, puffiness, or redness.  You have many joints that are painful and puffy.  You are not getting better with treatment.  You have very bad back pain or leg weakness.  You cannot control when you poop (bowel movement) or pee (urinate).  You do not feel better in 24 hours or are getting worse.  You are having side effects from your medicine. MAKE SURE YOU:   Understand these instructions.  Will watch your condition.  Will get help right away if you are not doing well or get worse. Document Released: 01/08/2010 Document Revised: 04/14/2012 Document Reviewed: 01/08/2010 [facility] Patient Information 2015 [place], [place]. This information is not intended to replace advice given to you by your health care provider. Make sure you discuss any questions you have with your health care provider.   You may take the hydrocodone prescribed for pain relief.  This will make you drowsy - do not drive within 4  hours of taking this medication.
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
## 2015-06-19 NOTE — ED Notes (Signed)
Patient reports left knee pain, seen here for same recently, patient states pain is getting worse. Did not have x-ray on recent visit, only given tramadol.

## 2015-06-22 ENCOUNTER — Ambulatory Visit: Payer: Medicaid Other | Admitting: Orthopedic Surgery

## 2015-06-27 ENCOUNTER — Encounter: Payer: Self-pay | Admitting: Orthopedic Surgery

## 2015-06-27 ENCOUNTER — Telehealth: Payer: Self-pay | Admitting: Orthopedic Surgery

## 2015-06-27 ENCOUNTER — Ambulatory Visit (INDEPENDENT_AMBULATORY_CARE_PROVIDER_SITE_OTHER): Payer: Medicaid Other | Admitting: Orthopedic Surgery

## 2015-06-27 ENCOUNTER — Other Ambulatory Visit: Payer: Self-pay | Admitting: *Deleted

## 2015-06-27 VITALS — BP 124/60 | Ht 64.0 in | Wt 252.0 lb

## 2015-06-27 DIAGNOSIS — M1711 Unilateral primary osteoarthritis, right knee: Secondary | ICD-10-CM

## 2015-06-27 DIAGNOSIS — M129 Arthropathy, unspecified: Secondary | ICD-10-CM

## 2015-06-27 DIAGNOSIS — M1732 Unilateral post-traumatic osteoarthritis, left knee: Secondary | ICD-10-CM

## 2015-06-27 NOTE — Telephone Encounter (Signed)
Routing to Dr Harrison 

## 2015-06-27 NOTE — Patient Instructions (Signed)
Joint Injection  Care After  Refer to this sheet in the next few days. These instructions provide you with information on caring for yourself after you have had a joint injection. Your caregiver also may give you more specific instructions. Your treatment has been planned according to current medical practices, but problems sometimes occur. Call your caregiver if you have any problems or questions after your procedure.  After any type of joint injection, it is not uncommon to experience:  · Soreness, swelling, or bruising around the injection site.  · Mild numbness, tingling, or weakness around the injection site caused by the numbing medicine used before or with the injection.  It also is possible to experience the following effects associated with the specific agent after injection:  · Iodine-based contrast agents:  ¨ Allergic reaction (itching, hives, widespread redness, and swelling beyond the injection site).  · Corticosteroids (These effects are rare.):  ¨ Allergic reaction.  ¨ Increased blood sugar levels (If you have diabetes and you notice that your blood sugar levels have increased, notify your caregiver).  ¨ Increased blood pressure levels.  ¨ Mood swings.  · Hyaluronic acid in the use of viscosupplementation.  ¨ Temporary heat or redness.  ¨ Temporary rash and itching.  ¨ Increased fluid accumulation in the injected joint.  These effects all should resolve within a day after your procedure.   HOME CARE INSTRUCTIONS  · Limit yourself to light activity the day of your procedure. Avoid lifting heavy objects, bending, stooping, or twisting.  · Take prescription or over-the-counter pain medication as directed by your caregiver.  · You may apply ice to your injection site to reduce pain and swelling the day of your procedure. Ice may be applied 03-04 times:  ¨ Put ice in a plastic bag.  ¨ Place a towel between your skin and the bag.  ¨ Leave the ice on for no longer than 15-20 minutes each time.  SEEK  IMMEDIATE MEDICAL CARE IF:   · Pain and swelling get worse rather than better or extend beyond the injection site.  · Numbness does not go away.  · Blood or fluid continues to leak from the injection site.  · You have chest pain.  · You have swelling of your face or tongue.  · You have trouble breathing or you become dizzy.  · You develop a fever, chills, or severe tenderness at the injection site that last longer than 1 day.  MAKE SURE YOU:  · Understand these instructions.  · Watch your condition.  · Get help right away if you are not doing well or if you get worse.  Document Released: 06/27/2011 Document Revised: 01/06/2012 Document Reviewed: 06/27/2011  [employer]® Patient Information ©2015 [employer]. This information is not intended to replace advice given to you by your health care provider. Make sure you discuss any questions you have with your health care provider.

## 2015-06-27 NOTE — Telephone Encounter (Signed)
yes

## 2015-06-27 NOTE — Progress Notes (Signed)
Chief Complaint  Patient presents with  . Follow-up    3 month follow up bilateral knees, repeat injections    Procedure note left knee injection verbal consent was obtained to inject left knee joint  Timeout was completed to confirm the site of injection  The medications used were 40 mg of Depo-Medrol and 1% lidocaine 3 cc  Anesthesia was provided by ethyl chloride and the skin was prepped with alcohol.  After cleaning the skin with alcohol a 20-gauge needle was used to inject the left knee joint. There were no complications. A sterile bandage was applied.   Procedure note right knee injection verbal consent was obtained to inject right knee joint  Timeout was completed to confirm the site of injection  The medications used were 40 mg of Depo-Medrol and 1% lidocaine 3 cc  Anesthesia was provided by ethyl chloride and the skin was prepped with alcohol.  After cleaning the skin with alcohol a 20-gauge needle was used to inject the right knee joint. There were no complications. A sterile bandage was applied.

## 2015-06-27 NOTE — Telephone Encounter (Signed)
Patient is asking for a Rx for Prednisone, please advise?

## 2015-06-28 NOTE — Telephone Encounter (Signed)
PREDNISONE IS LISTED AS AN ALLERGY IN CHART AND NO PREVIOUS SCRIPTS

## 2015-06-28 NOTE — Telephone Encounter (Signed)
SPOKE WITH PATIENT AND SHE UNDERSTANDS PREDNISONE IS AN ALLERGY, STATES SHE IS ALSO UNABLE TO TAKE IBUPROFEN

## 2015-08-10 ENCOUNTER — Ambulatory Visit (HOSPITAL_COMMUNITY): Payer: Medicaid Other | Attending: Family Medicine | Admitting: Physical Therapy

## 2015-08-10 DIAGNOSIS — M542 Cervicalgia: Secondary | ICD-10-CM | POA: Diagnosis present

## 2015-08-10 DIAGNOSIS — M25612 Stiffness of left shoulder, not elsewhere classified: Secondary | ICD-10-CM | POA: Insufficient documentation

## 2015-08-10 DIAGNOSIS — M436 Torticollis: Secondary | ICD-10-CM | POA: Insufficient documentation

## 2015-08-10 DIAGNOSIS — M6281 Muscle weakness (generalized): Secondary | ICD-10-CM | POA: Diagnosis present

## 2015-08-10 DIAGNOSIS — M25611 Stiffness of right shoulder, not elsewhere classified: Secondary | ICD-10-CM | POA: Diagnosis present

## 2015-08-10 DIAGNOSIS — R293 Abnormal posture: Secondary | ICD-10-CM | POA: Diagnosis present

## 2015-08-10 DIAGNOSIS — M6289 Other specified disorders of muscle: Secondary | ICD-10-CM

## 2015-08-10 DIAGNOSIS — G729 Myopathy, unspecified: Secondary | ICD-10-CM | POA: Diagnosis present
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
## 2015-08-24 ENCOUNTER — Ambulatory Visit (HOSPITAL_COMMUNITY): Payer: Medicaid Other | Admitting: Physical Therapy

## 2015-08-24 DIAGNOSIS — M6281 Muscle weakness (generalized): Secondary | ICD-10-CM

## 2015-08-24 DIAGNOSIS — R293 Abnormal posture: Secondary | ICD-10-CM

## 2015-08-24 DIAGNOSIS — M542 Cervicalgia: Secondary | ICD-10-CM

## 2015-08-24 DIAGNOSIS — M25611 Stiffness of right shoulder, not elsewhere classified: Secondary | ICD-10-CM

## 2015-08-24 DIAGNOSIS — M25612 Stiffness of left shoulder, not elsewhere classified: Secondary | ICD-10-CM

## 2015-08-24 DIAGNOSIS — M436 Torticollis: Secondary | ICD-10-CM

## 2015-08-24 DIAGNOSIS — M6289 Other specified disorders of muscle: Secondary | ICD-10-CM
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
## 2015-08-29 ENCOUNTER — Ambulatory Visit (HOSPITAL_COMMUNITY): Payer: Medicaid Other | Attending: Family Medicine

## 2015-08-29 DIAGNOSIS — M6289 Other specified disorders of muscle: Secondary | ICD-10-CM

## 2015-08-29 DIAGNOSIS — G729 Myopathy, unspecified: Secondary | ICD-10-CM | POA: Insufficient documentation

## 2015-08-29 DIAGNOSIS — M6281 Muscle weakness (generalized): Secondary | ICD-10-CM | POA: Diagnosis present

## 2015-08-29 DIAGNOSIS — M25611 Stiffness of right shoulder, not elsewhere classified: Secondary | ICD-10-CM | POA: Insufficient documentation

## 2015-08-29 DIAGNOSIS — M542 Cervicalgia: Secondary | ICD-10-CM

## 2015-08-29 DIAGNOSIS — M25612 Stiffness of left shoulder, not elsewhere classified: Secondary | ICD-10-CM | POA: Diagnosis present

## 2015-08-29 DIAGNOSIS — R293 Abnormal posture: Secondary | ICD-10-CM | POA: Insufficient documentation

## 2015-08-29 DIAGNOSIS — M436 Torticollis: Secondary | ICD-10-CM | POA: Insufficient documentation
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
## 2015-08-31 ENCOUNTER — Encounter (HOSPITAL_COMMUNITY): Payer: Medicaid Other

## 2015-09-05 ENCOUNTER — Encounter (HOSPITAL_COMMUNITY): Payer: Medicaid Other | Admitting: Physical Therapy

## 2015-09-07 ENCOUNTER — Ambulatory Visit (HOSPITAL_COMMUNITY): Payer: Medicaid Other | Admitting: Physical Therapy

## 2015-09-07 DIAGNOSIS — M542 Cervicalgia: Secondary | ICD-10-CM | POA: Diagnosis not present

## 2015-09-07 DIAGNOSIS — M6289 Other specified disorders of muscle: Secondary | ICD-10-CM

## 2015-09-07 DIAGNOSIS — M436 Torticollis: Secondary | ICD-10-CM

## 2015-09-07 DIAGNOSIS — M25611 Stiffness of right shoulder, not elsewhere classified: Secondary | ICD-10-CM

## 2015-09-07 DIAGNOSIS — M6281 Muscle weakness (generalized): Secondary | ICD-10-CM

## 2015-09-07 DIAGNOSIS — M25612 Stiffness of left shoulder, not elsewhere classified: Secondary | ICD-10-CM

## 2015-09-07 DIAGNOSIS — R293 Abnormal posture: Secondary | ICD-10-CM
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
## 2015-09-12 ENCOUNTER — Ambulatory Visit (HOSPITAL_COMMUNITY): Payer: Medicaid Other | Admitting: Physical Therapy

## 2015-09-12 DIAGNOSIS — M436 Torticollis: Secondary | ICD-10-CM

## 2015-09-12 DIAGNOSIS — M6289 Other specified disorders of muscle: Secondary | ICD-10-CM

## 2015-09-12 DIAGNOSIS — M25612 Stiffness of left shoulder, not elsewhere classified: Secondary | ICD-10-CM

## 2015-09-12 DIAGNOSIS — R293 Abnormal posture: Secondary | ICD-10-CM

## 2015-09-12 DIAGNOSIS — M6281 Muscle weakness (generalized): Secondary | ICD-10-CM

## 2015-09-12 DIAGNOSIS — M542 Cervicalgia: Secondary | ICD-10-CM | POA: Diagnosis not present

## 2015-09-12 DIAGNOSIS — M25611 Stiffness of right shoulder, not elsewhere classified: Secondary | ICD-10-CM
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
## 2015-09-14 ENCOUNTER — Ambulatory Visit (HOSPITAL_COMMUNITY): Payer: Medicaid Other | Admitting: Physical Therapy

## 2015-09-14 DIAGNOSIS — R293 Abnormal posture: Secondary | ICD-10-CM

## 2015-09-14 DIAGNOSIS — M6289 Other specified disorders of muscle: Secondary | ICD-10-CM

## 2015-09-14 DIAGNOSIS — M25611 Stiffness of right shoulder, not elsewhere classified: Secondary | ICD-10-CM

## 2015-09-14 DIAGNOSIS — M25612 Stiffness of left shoulder, not elsewhere classified: Secondary | ICD-10-CM

## 2015-09-14 DIAGNOSIS — M436 Torticollis: Secondary | ICD-10-CM

## 2015-09-14 DIAGNOSIS — M6281 Muscle weakness (generalized): Secondary | ICD-10-CM

## 2015-09-14 DIAGNOSIS — M542 Cervicalgia: Secondary | ICD-10-CM
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
## 2015-09-19 ENCOUNTER — Ambulatory Visit (HOSPITAL_COMMUNITY): Payer: Medicaid Other

## 2015-09-19 DIAGNOSIS — M25612 Stiffness of left shoulder, not elsewhere classified: Secondary | ICD-10-CM

## 2015-09-19 DIAGNOSIS — M25611 Stiffness of right shoulder, not elsewhere classified: Secondary | ICD-10-CM

## 2015-09-19 DIAGNOSIS — M6281 Muscle weakness (generalized): Secondary | ICD-10-CM

## 2015-09-19 DIAGNOSIS — M6289 Other specified disorders of muscle: Secondary | ICD-10-CM

## 2015-09-19 DIAGNOSIS — M542 Cervicalgia: Secondary | ICD-10-CM

## 2015-09-19 DIAGNOSIS — M436 Torticollis: Secondary | ICD-10-CM
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
## 2015-09-20 ENCOUNTER — Ambulatory Visit (HOSPITAL_COMMUNITY): Payer: Medicaid Other | Admitting: Physical Therapy

## 2015-09-20 DIAGNOSIS — M6281 Muscle weakness (generalized): Secondary | ICD-10-CM

## 2015-09-20 DIAGNOSIS — R293 Abnormal posture: Secondary | ICD-10-CM

## 2015-09-20 DIAGNOSIS — M25612 Stiffness of left shoulder, not elsewhere classified: Secondary | ICD-10-CM

## 2015-09-20 DIAGNOSIS — M542 Cervicalgia: Secondary | ICD-10-CM

## 2015-09-20 DIAGNOSIS — M6289 Other specified disorders of muscle: Secondary | ICD-10-CM

## 2015-09-20 DIAGNOSIS — M25611 Stiffness of right shoulder, not elsewhere classified: Secondary | ICD-10-CM

## 2015-09-20 DIAGNOSIS — M436 Torticollis: Secondary | ICD-10-CM
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
## 2015-09-26 ENCOUNTER — Ambulatory Visit (HOSPITAL_COMMUNITY): Payer: Medicaid Other | Admitting: Physical Therapy

## 2015-09-26 DIAGNOSIS — M6289 Other specified disorders of muscle: Secondary | ICD-10-CM

## 2015-09-26 DIAGNOSIS — M542 Cervicalgia: Secondary | ICD-10-CM | POA: Diagnosis not present

## 2015-09-26 DIAGNOSIS — M25612 Stiffness of left shoulder, not elsewhere classified: Secondary | ICD-10-CM

## 2015-09-26 DIAGNOSIS — M6281 Muscle weakness (generalized): Secondary | ICD-10-CM

## 2015-09-26 DIAGNOSIS — M25611 Stiffness of right shoulder, not elsewhere classified: Secondary | ICD-10-CM

## 2015-09-26 DIAGNOSIS — M436 Torticollis: Secondary | ICD-10-CM

## 2015-09-26 DIAGNOSIS — R293 Abnormal posture: Secondary | ICD-10-CM
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
## 2015-09-28 ENCOUNTER — Ambulatory Visit (INDEPENDENT_AMBULATORY_CARE_PROVIDER_SITE_OTHER): Payer: Medicaid Other | Admitting: Orthopedic Surgery

## 2015-09-28 ENCOUNTER — Encounter (HOSPITAL_COMMUNITY): Payer: Medicaid Other | Admitting: Physical Therapy

## 2015-09-28 VITALS — BP 115/58 | Ht 64.0 in | Wt 247.0 lb

## 2015-09-28 DIAGNOSIS — M129 Arthropathy, unspecified: Secondary | ICD-10-CM

## 2015-09-28 DIAGNOSIS — M1732 Unilateral post-traumatic osteoarthritis, left knee: Secondary | ICD-10-CM | POA: Diagnosis not present

## 2015-09-28 DIAGNOSIS — M1711 Unilateral primary osteoarthritis, right knee: Secondary | ICD-10-CM

## 2015-09-28 DIAGNOSIS — M1712 Unilateral primary osteoarthritis, left knee: Secondary | ICD-10-CM

## 2015-09-28 NOTE — Progress Notes (Signed)
Routine follow-up  Bilateral knee pain 3 month injections requested  Procedure note left knee injection verbal consent was obtained to inject left knee joint  Timeout was completed to confirm the site of injection  The medications used were 40 mg of Depo-Medrol and 1% lidocaine 3 cc  Anesthesia was provided by ethyl chloride and the skin was prepped with alcohol.  After cleaning the skin with alcohol a 20-gauge needle was used to inject the left knee joint. There were no complications. A sterile bandage was applied.   Procedure note right knee injection verbal consent was obtained to inject right knee joint  Timeout was completed to confirm the site of injection  The medications used were 40 mg of Depo-Medrol and 1% lidocaine 3 cc  Anesthesia was provided by ethyl chloride and the skin was prepped with alcohol.  After cleaning the skin with alcohol a 20-gauge needle was used to inject the right knee joint. There were no complications. A sterile bandage was applied.  Return 3 months repeat injections as needed

## 2015-10-03 ENCOUNTER — Ambulatory Visit (HOSPITAL_COMMUNITY): Payer: Medicaid Other | Attending: Family Medicine

## 2015-10-03 DIAGNOSIS — M25611 Stiffness of right shoulder, not elsewhere classified: Secondary | ICD-10-CM | POA: Diagnosis present

## 2015-10-03 DIAGNOSIS — M542 Cervicalgia: Secondary | ICD-10-CM | POA: Diagnosis not present

## 2015-10-03 DIAGNOSIS — M6281 Muscle weakness (generalized): Secondary | ICD-10-CM | POA: Diagnosis present

## 2015-10-03 DIAGNOSIS — M436 Torticollis: Secondary | ICD-10-CM | POA: Insufficient documentation

## 2015-10-03 DIAGNOSIS — R293 Abnormal posture: Secondary | ICD-10-CM | POA: Insufficient documentation

## 2015-10-03 DIAGNOSIS — M6289 Other specified disorders of muscle: Secondary | ICD-10-CM

## 2015-10-03 DIAGNOSIS — G729 Myopathy, unspecified: Secondary | ICD-10-CM | POA: Insufficient documentation

## 2015-10-03 DIAGNOSIS — M25612 Stiffness of left shoulder, not elsewhere classified: Secondary | ICD-10-CM | POA: Diagnosis present
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
## 2015-11-06 ENCOUNTER — Ambulatory Visit: Payer: Medicaid Other | Admitting: Sports Medicine

## 2015-11-13 ENCOUNTER — Ambulatory Visit (INDEPENDENT_AMBULATORY_CARE_PROVIDER_SITE_OTHER): Payer: Medicaid Other | Admitting: Sports Medicine

## 2015-11-13 ENCOUNTER — Ambulatory Visit (INDEPENDENT_AMBULATORY_CARE_PROVIDER_SITE_OTHER): Payer: Medicaid Other

## 2015-11-13 ENCOUNTER — Encounter: Payer: Self-pay | Admitting: Sports Medicine

## 2015-11-13 DIAGNOSIS — M898X9 Other specified disorders of bone, unspecified site: Secondary | ICD-10-CM

## 2015-11-13 DIAGNOSIS — M7750 Other enthesopathy of unspecified foot: Secondary | ICD-10-CM | POA: Diagnosis not present

## 2015-11-13 DIAGNOSIS — B353 Tinea pedis: Secondary | ICD-10-CM

## 2015-11-13 DIAGNOSIS — M79671 Pain in right foot: Secondary | ICD-10-CM

## 2015-11-13 DIAGNOSIS — M779 Enthesopathy, unspecified: Secondary | ICD-10-CM

## 2015-11-13 MED ORDER — TRIAMCINOLONE ACETONIDE 10 MG/ML IJ SUSP: 10.0000 mg | Freq: Once | INTRAMUSCULAR | Status: DC

## 2015-11-13 MED ORDER — OXYCODONE-ACETAMINOPHEN 5-325 MG PO TABS: 1.0000 | ORAL_TABLET | Freq: Three times a day (TID) | ORAL | Status: DC | PRN

## 2015-11-13 MED ORDER — NAFTIFINE HCL 1 % EX CREA: TOPICAL_CREAM | Freq: Every day | CUTANEOUS | Status: DC

## 2015-11-13 NOTE — Patient Instructions (Signed)
Pre-Operative Instructions  Congratulations, you have decided to take an important step to improving your quality of life.  You can be assured that the doctors of [facility] will be with you every step of the way.  1. Plan to be at the surgery center/hospital at least 1 (one) hour prior to your scheduled time unless otherwise directed by the surgical center/hospital staff.  You must have a responsible adult accompany you, remain during the surgery and drive you home.  Make sure you have directions to the surgical center/hospital and know how to get there on time. 2. For hospital based surgery you will need to obtain a history and physical form from your family physician within 1 month prior to the date of surgery- we will give you a form for you primary physician.  3. We make every effort to accommodate the date you request for surgery.  There are however, times where surgery dates or times have to be moved.  We will contact you as soon as possible if a change in schedule is required.   4. No Aspirin/Ibuprofen for one week before surgery.  If you are on aspirin, any non-steroidal anti-inflammatory medications (Mobic, Aleve, Ibuprofen) you should stop taking it 7 days prior to your surgery.  You make take Tylenol  For pain prior to surgery.  5. Medications- If you are taking daily heart and blood pressure medications, seizure, reflux, allergy, asthma, anxiety, pain or diabetes medications, make sure the surgery center/hospital is aware before the day of surgery so they may notify you which medications to take or avoid the day of surgery. 6. No food or drink after midnight the night before surgery unless directed otherwise by surgical center/hospital staff. 7. No alcoholic beverages 24 hours prior to surgery.  No smoking 24 hours prior to or 24 hours after surgery. 8. Wear loose pants or shorts- loose enough to fit over bandages, boots, and casts. 9. No slip on shoes, sneakers are best. 10. Bring  your boot with you to the surgery center/hospital.  Also bring crutches or a walker if your physician has prescribed it for you.  If you do not have this equipment, it will be provided for you after surgery. 11. If you have not been contracted by the surgery center/hospital by the day before your surgery, call to confirm the date and time of your surgery. 12. Leave-time from work may vary depending on the type of surgery you have.  Appropriate arrangements should be made prior to surgery with your employer. 13. Prescriptions will be provided immediately following surgery by your doctor.  Have these filled as soon as possible after surgery and take the medication as directed. 14. Remove nail polish on the operative foot. 15. Wash the night before surgery.  The night before surgery wash the foot and leg well with the antibacterial soap provided and water paying special attention to beneath the toenails and in between the toes.  Rinse thoroughly with water and dry well with a towel.  Perform this wash unless told not to do so by your physician.  Enclosed: 1 Ice pack (please put in freezer the night before surgery)   1 Hibiclens skin cleaner   Pre-op Instructions  If you have any questions regarding the instructions, do not hesitate to call our office.  [place]: [address] [place], [place] [address] [phone]  [place]: [address] [place], [place] [address] [phone]  [place]: [address]  [place], [place] [address] [phone]  Dr. Richard   Tuchman DPM, Dr. Norman Regal DPM Dr. Richard Sikora DPM, Dr. M. Todd Hyatt DPM, Dr. Kathryn Egerton DPM 

## 2015-11-13 NOTE — Progress Notes (Signed)
Patient ID: Rachel [name], female   DOB: [birth year], 60 y.o.   MRN: [id number]   Subjective: Rachel [name] is a 60 y.o. female patient who presents to office for evaluation of Right foot pain. Patient states that she has a itchy rash that extends into the bottoms of the toes. Patient also admits to pain over the bumps at 5th met and top of foot on right aggrivated with shoes. Patient denies any other pedal complaints.  Patient Active Problem List   Diagnosis Date Noted  . Obesity 05/22/2015  . Mild protein-calorie malnutrition ([place]) 12/14/2013  . Dehydration 12/12/2013  . Colitis, acute 12/12/2013  . Hypokalemia 12/12/2013  . C. difficile colitis 12/11/2013  . Abdominal pain 11/07/2013  . Colitis 11/07/2013  . Acute colitis 11/07/2013  . Arthritis of knee, right 09/02/2013  . Osteoarthritis of left knee 04/13/2013  . OA (osteoarthritis) of knee 01/16/2012  . Acquired trigger finger 11/21/2011  . ARTHRITIS, LEFT KNEE 07/12/2010  . NECK PAIN 05/31/2010  . JOINT EFFUSION, LEFT KNEE 04/05/2010  . RUPTURE ROTATOR CUFF 05/03/2009  . THYROID STIMULATING HORMONE, ABNORMAL 04/19/2009  . IMPINGEMENT SYNDROME 03/29/2009  . NUMBNESS, ARM 03/29/2009  . SKIN RASH 03/08/2009  . HIP, ARTHRITIS, DEGEN./OSTEO 09/26/2008  . ALLERGIC RHINITIS 08/11/2008  . HIP PAIN, RIGHT 07/19/2008  . SYMPTOMATIC MENOPAUSAL/FEMALE CLIMACTERIC STATES 03/17/2008  . SHOULDER PAIN 01/25/2008  . HYPERLIPIDEMIA 09/27/2006  . OBESITY 09/27/2006  . ANXIETY 09/27/2006  . DEPRESSION 09/27/2006  . HYPERTENSION 09/27/2006  . OSTEOARTHRITIS 09/27/2006  . MEDIAL MENISCUS TEAR, LEFT 09/27/2006   Current Outpatient Prescriptions on File Prior to Visit  Medication Sig Dispense Refill  . ALPRAZolam (XANAX) 1 MG tablet Take 1 mg by mouth 3 (three) times daily as needed for sleep or anxiety.     Marland Kitchen amLODipine (NORVASC) 10 MG tablet Take 10 mg by mouth every morning.     . citalopram (CELEXA) 40 MG tablet Take 40 mg  by mouth every morning.    . cloNIDine (CATAPRES) 0.1 MG tablet Take 0.1 mg by mouth every evening.     . hydrochlorothiazide (HYDRODIURIL) 25 MG tablet Take 25 mg by mouth daily.    Marland Kitchen HYDROcodone-acetaminophen (NORCO/VICODIN) 5-325 MG per tablet Take 1 tablet by mouth every 4 (four) hours as needed. 20 tablet 0  . naproxen sodium (ALEVE) 220 MG tablet Take 220-440 mg by mouth daily as needed (for pain).    . pravastatin (PRAVACHOL) 20 MG tablet Take 20 mg by mouth every evening.     Marland Kitchen tiZANidine (ZANAFLEX) 4 MG tablet Take 1 tablet (4 mg total) by mouth every 6 (six) hours as needed for muscle spasms. 60 tablet 2  . traMADol (ULTRAM) 50 MG tablet Take 1 tablet (50 mg total) by mouth every 6 (six) hours as needed. 15 tablet 0  . trazodone (DESYREL) 300 MG tablet Take 300 mg by mouth at bedtime.    . vitamin C (ASCORBIC ACID) 500 MG tablet Take 500 mg by mouth daily.    . [DISCONTINUED] famotidine (PEPCID) 20 MG tablet Take 20 mg by mouth 2 (two) times daily.     No current facility-administered medications on file prior to visit.   Allergies  Allergen Reactions  . Lisinopril Cough  . Penicillins Itching  . Povidone-Iodine Itching  . Prednisone Nausea And Vomiting  . Betadine [Povidone Iodine] Rash   Objective:  General: Alert and oriented x3 in no acute distress  Dermatology: No open lesions bilateral lower extremities, no webspace macerations,  no ecchymosis bilateral, all nails x 10 are well manicured. There is scaly skin in mocassin distrubution at plantar forefoot that extends into sulcus of toes resembling tinea on right foot. Surgical scars well healed bilateral.   Vascular: Dorsalis Pedis and Posterior Tibial pedal pulses 1/4, Capillary Fill Time 3 seconds,(+) scant pedal hair growth bilateral, no edema bilateral lower extremities, Temperature gradient within normal limits.  Neurology: Johney Maine sensation intact via light touch bilateral.   Musculoskeletal: Mild tenderness with  palpation at 5th met head and dorsal midfoot with mild soft tissue swelling consistent with capsulitis on Right, No pain with calf compression bilateral. Ankle and Pedal joint range of motion is within normal limits. Strength within normal limits in all groups bilateral.   Xrays  Right Foot   Impression: Mild decreased osseous mineralization, bone exostosis at lateral 5th met head and dorsal TMJ, No other acute pathology.        Assessment and Plan: Problem List Items Addressed This Visit    None    Visit Diagnoses    Right foot pain    -  Primary    Relevant Medications    oxyCODONE-acetaminophen (PERCOCET) 5-325 MG tablet    Other Relevant Orders    DG Foot 2 Views Right    Tinea pedis of right foot        Relevant Medications    naftifine (NAFTIN) 1 % cream    Other Relevant Orders    DG Foot 2 Views Right    Bony exostosis        5th met head and dorsal midfoot, Right    Relevant Medications    oxyCODONE-acetaminophen (PERCOCET) 5-325 MG tablet    triamcinolone acetonide (KENALOG) 10 MG/ML injection 10 mg    Other Relevant Orders    DG Foot 2 Views Right    Capsulitis        Right foot    Relevant Medications    oxyCODONE-acetaminophen (PERCOCET) 5-325 MG tablet    triamcinolone acetonide (KENALOG) 10 MG/ML injection 10 mg       -Complete examination performed -Xrays reviewed -Discussed treatement options -Rx Naftin for tinea -Patient opt for surgical management for painful boney areas on right foot. Consent obtained for removal of bone/exostosis Right 5th metatarsal and dorsal midfoot. Pre and Post op course explained. Surgical booking slip submitted and provided patient with Surgical packet and info for [facility]. Patient to call to schedule surgery.  -Dispensed surgical shoe to use post op -Rx Percocet for 5 days to help with right foot pain. Also after verbal consent injected 1.5cc mixture of lidocaine, marcaine, dexmethasone, kenalog 10 at Right 5th MTPJ and dorsal  midfoot; Post injection care discussed. -Patient to return to office after surgery or sooner if condition worsens.  Landis Martins, DPM

## 2015-11-14 ENCOUNTER — Telehealth: Payer: Self-pay | Admitting: *Deleted

## 2015-11-14 NOTE — Telephone Encounter (Signed)
"  I'd like to schedule my surgery.  They told me to call today and speak with you."  When would you like to schedule?  Dr. Cannon Kettle does surgery on Mondays.  "Whatever date is available."  She can do it on February 6th.  "That date will be fine."  Go ahead and register with the surgical center.  You can call or do it online.  Remember not to eat or drink anything after midnight.  Surgical will call the Friday before with the arrival time.  "Okay, thank you."

## 2015-12-04 ENCOUNTER — Encounter: Payer: Self-pay | Admitting: Sports Medicine

## 2015-12-04 DIAGNOSIS — M2570 Osteophyte, unspecified joint: Secondary | ICD-10-CM | POA: Diagnosis not present

## 2015-12-05 ENCOUNTER — Telehealth: Payer: Self-pay | Admitting: Sports Medicine

## 2015-12-05 NOTE — Telephone Encounter (Signed)
Post op phone call made to patient. Patient did not answer. Left voicemail with call back phone #.

## 2015-12-11 ENCOUNTER — Ambulatory Visit (INDEPENDENT_AMBULATORY_CARE_PROVIDER_SITE_OTHER): Payer: Medicaid Other

## 2015-12-11 ENCOUNTER — Ambulatory Visit (INDEPENDENT_AMBULATORY_CARE_PROVIDER_SITE_OTHER): Payer: Medicaid Other | Admitting: Sports Medicine

## 2015-12-11 ENCOUNTER — Encounter: Payer: Self-pay | Admitting: Sports Medicine

## 2015-12-11 DIAGNOSIS — M7750 Other enthesopathy of unspecified foot: Secondary | ICD-10-CM | POA: Diagnosis not present

## 2015-12-11 DIAGNOSIS — M79671 Pain in right foot: Secondary | ICD-10-CM

## 2015-12-11 DIAGNOSIS — Z9889 Other specified postprocedural states: Secondary | ICD-10-CM

## 2015-12-11 MED ORDER — OXYCODONE-ACETAMINOPHEN 10-325 MG PO TABS: 1.0000 | ORAL_TABLET | ORAL | Status: DC | PRN

## 2015-12-11 NOTE — Progress Notes (Signed)
Patient ID: Rachel [name], female   DOB: 02-Aug-1956, 60 y.o.   MRN: [id number] Subjective: Rachel [name] is a 60 y.o. female patient seen today in office for POV #1 (DOS 12-04-15), S/P Partial ostectomy 5th met head and dorsal midfoot, right foot. Patient admits to pain at surgical site, denies calf pain, denies headache, chest pain, shortness of breath, nausea, vomiting, fever, or chills. 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 No other issues noted.   Patient Active Problem List   Diagnosis Date Noted  . Obesity 05/22/2015  . Mild protein-calorie malnutrition ([place]) 12/14/2013  . Dehydration 12/12/2013  . Colitis, acute 12/12/2013  . Hypokalemia 12/12/2013  . C. difficile colitis 12/11/2013  . Abdominal pain 11/07/2013  . Colitis 11/07/2013  . Acute colitis 11/07/2013  . Arthritis of knee, right 09/02/2013  . Osteoarthritis of left knee 04/13/2013  . OA (osteoarthritis) of knee 01/16/2012  . Acquired trigger finger 11/21/2011  . ARTHRITIS, LEFT KNEE 07/12/2010  . NECK PAIN 05/31/2010  . JOINT EFFUSION, LEFT KNEE 04/05/2010  . RUPTURE ROTATOR CUFF 05/03/2009  . THYROID STIMULATING HORMONE, ABNORMAL 04/19/2009  . IMPINGEMENT SYNDROME 03/29/2009  . NUMBNESS, ARM 03/29/2009  . SKIN RASH 03/08/2009  . HIP, ARTHRITIS, DEGEN./OSTEO 09/26/2008  . ALLERGIC RHINITIS 08/11/2008  . HIP PAIN, RIGHT 07/19/2008  . SYMPTOMATIC MENOPAUSAL/FEMALE CLIMACTERIC STATES 03/17/2008  . SHOULDER PAIN 01/25/2008  . HYPERLIPIDEMIA 09/27/2006  . OBESITY 09/27/2006  . ANXIETY 09/27/2006  . DEPRESSION 09/27/2006  . HYPERTENSION 09/27/2006  . OSTEOARTHRITIS 09/27/2006  . MEDIAL MENISCUS TEAR, LEFT 09/27/2006   Current Outpatient Prescriptions on File Prior to Visit  Medication Sig Dispense Refill  . ALPRAZolam (XANAX) 1 MG tablet Take 1 mg by mouth 3 (three) times daily as needed for sleep or anxiety.     Marland Kitchen amLODipine (NORVASC) 10 MG tablet Take 10 mg by mouth  every morning.     . citalopram (CELEXA) 40 MG tablet Take 40 mg by mouth every morning.    . cloNIDine (CATAPRES) 0.1 MG tablet Take 0.1 mg by mouth every evening.     . hydrochlorothiazide (HYDRODIURIL) 25 MG tablet Take 25 mg by mouth daily.    Marland Kitchen HYDROcodone-acetaminophen (NORCO/VICODIN) 5-325 MG per tablet Take 1 tablet by mouth every 4 (four) hours as needed. 20 tablet 0  . naftifine (NAFTIN) 1 % cream Apply topically daily. 30 g 0  . naproxen sodium (ALEVE) 220 MG tablet Take 220-440 mg by mouth daily as needed (for pain).    . pravastatin (PRAVACHOL) 20 MG tablet Take 20 mg by mouth every evening.     Marland Kitchen tiZANidine (ZANAFLEX) 4 MG tablet Take 1 tablet (4 mg total) by mouth every 6 (six) hours as needed for muscle spasms. 60 tablet 2  . traMADol (ULTRAM) 50 MG tablet Take 1 tablet (50 mg total) by mouth every 6 (six) hours as needed. 15 tablet 0  . trazodone (DESYREL) 300 MG tablet Take 300 mg by mouth at bedtime.    . vitamin C (ASCORBIC ACID) 500 MG tablet Take 500 mg by mouth daily.    . [DISCONTINUED] famotidine (PEPCID) 20 MG tablet Take 20 mg by mouth 2 (two) times daily.     Current Facility-Administered Medications on File Prior to Visit  Medication Dose Route Frequency Provider Last Rate Last Dose  . triamcinolone acetonide (KENALOG) 10 MG/ML injection 10 mg  10 mg Other Once Landis Martins, DPM       Allergies  Allergen Reactions  . Lisinopril Cough  . Penicillins Itching  . Povidone-Iodine Itching  . Prednisone Nausea And Vomiting  . Betadine [Povidone Iodine] Rash   Objective: General: No acute distress, AAOx3  Right foot: Sutures intact with no gapping or dehiscence at surgical sites, moderate swelling to right foot with ecchymosis to lateral foot and heel, no erythema, no warmth, no drainage, no signs of infection noted, Capillary fill time <3 seconds in all digits, gross sensation present via light touch to right foot. No pain or crepitation with range of motion right  foot.  No pain with calf compression.   Post Op Xray, Right foot: Ostectomy sites appear minimized free of recurrent exostosis. No other acute pathology. Soft tissue swelling within normal limits for post op status.   Assessment and Plan:  Problem List Items Addressed This Visit    None    Visit Diagnoses    Right foot pain    -  Primary    Relevant Medications    oxyCODONE-acetaminophen (PERCOCET) 10-325 MG tablet    Other Relevant Orders    DG Foot Complete Right    Status post right foot surgery        Partial ostectomy 5th met head and dorsal midfoot, 12-04-15, healing well    Relevant Medications    oxyCODONE-acetaminophen (PERCOCET) 10-325 MG tablet        -Patient seen and evaluated -Xrays reviewed  -Applied dry sterile dressing to surgical site right foot secured with ACE wrap and stockinet  -Advised patient to make sure to keep dressings clean, dry, and intact to right surgical sites, removing the ACE as needed  -Advised patient to continue with post-op shoe on right foot   -Advised patient to limit activity to necessity  -Advised patient to ice and elevate as necessary  -Refilled pain medication at today's encounter  -Will plan for suture removal at next office visit. In the meantime, patient to call office if any issues or problems arise.   Landis Martins, DPM

## 2015-12-18 ENCOUNTER — Ambulatory Visit (INDEPENDENT_AMBULATORY_CARE_PROVIDER_SITE_OTHER): Payer: Medicaid Other | Admitting: Sports Medicine

## 2015-12-18 ENCOUNTER — Encounter: Payer: Self-pay | Admitting: Sports Medicine

## 2015-12-18 DIAGNOSIS — B353 Tinea pedis: Secondary | ICD-10-CM

## 2015-12-18 DIAGNOSIS — M79671 Pain in right foot: Secondary | ICD-10-CM

## 2015-12-18 DIAGNOSIS — Z9889 Other specified postprocedural states: Secondary | ICD-10-CM

## 2015-12-18 MED ORDER — OXYCODONE-ACETAMINOPHEN 10-325 MG PO TABS
1.0000 | ORAL_TABLET | ORAL | Status: DC | PRN
Start: 2015-12-18 — End: 2016-01-01

## 2015-12-18 NOTE — Progress Notes (Signed)
Patient ID: Rachel [name], female   DOB: Apr 07, 1956, 60 y.o.   MRN: [id number]  Subjective: Rachel [name] is a 60 y.o. female patient seen today in office for POV #2 (DOS 12-04-15), S/P Partial ostectomy 5th met head and dorsal midfoot, right foot. Patient admits to pain at surgical site that is getting better but still sore on top, denies calf pain, denies headache, chest pain, shortness of breath, nausea, vomiting, fever, or chills. No other issues noted.   Patient Active Problem List   Diagnosis Date Noted  . Obesity 05/22/2015  . Mild protein-calorie malnutrition ([place]) 12/14/2013  . Dehydration 12/12/2013  . Colitis, acute 12/12/2013  . Hypokalemia 12/12/2013  . C. difficile colitis 12/11/2013  . Abdominal pain 11/07/2013  . Colitis 11/07/2013  . Acute colitis 11/07/2013  . Arthritis of knee, right 09/02/2013  . Osteoarthritis of left knee 04/13/2013  . OA (osteoarthritis) of knee 01/16/2012  . Acquired trigger finger 11/21/2011  . ARTHRITIS, LEFT KNEE 07/12/2010  . NECK PAIN 05/31/2010  . JOINT EFFUSION, LEFT KNEE 04/05/2010  . RUPTURE ROTATOR CUFF 05/03/2009  . THYROID STIMULATING HORMONE, ABNORMAL 04/19/2009  . IMPINGEMENT SYNDROME 03/29/2009  . NUMBNESS, ARM 03/29/2009  . SKIN RASH 03/08/2009  . HIP, ARTHRITIS, DEGEN./OSTEO 09/26/2008  . ALLERGIC RHINITIS 08/11/2008  . HIP PAIN, RIGHT 07/19/2008  . SYMPTOMATIC MENOPAUSAL/FEMALE CLIMACTERIC STATES 03/17/2008  . SHOULDER PAIN 01/25/2008  . HYPERLIPIDEMIA 09/27/2006  . OBESITY 09/27/2006  . ANXIETY 09/27/2006  . DEPRESSION 09/27/2006  . HYPERTENSION 09/27/2006  . OSTEOARTHRITIS 09/27/2006  . MEDIAL MENISCUS TEAR, LEFT 09/27/2006   Current Outpatient Prescriptions on File Prior to Visit  Medication Sig Dispense Refill  . ALPRAZolam (XANAX) 1 MG tablet Take 1 mg by mouth 3 (three) times daily as needed for sleep or anxiety.     Marland Kitchen amLODipine (NORVASC) 10 MG tablet Take 10 mg by mouth every morning.     .  citalopram (CELEXA) 40 MG tablet Take 40 mg by mouth every morning.    . cloNIDine (CATAPRES) 0.1 MG tablet Take 0.1 mg by mouth every evening.     . hydrochlorothiazide (HYDRODIURIL) 25 MG tablet Take 25 mg by mouth daily.    Marland Kitchen HYDROcodone-acetaminophen (NORCO) 5-325 MG tablet     . HYDROcodone-acetaminophen (NORCO/VICODIN) 5-325 MG per tablet Take 1 tablet by mouth every 4 (four) hours as needed. 20 tablet 0  . naftifine (NAFTIN) 1 % cream Apply topically daily. 30 g 0  . naproxen sodium (ALEVE) 220 MG tablet Take 220-440 mg by mouth daily as needed (for pain).    Marland Kitchen oxyCODONE-acetaminophen (PERCOCET) 10-325 MG tablet Take 1 tablet by mouth every 4 (four) hours as needed for pain. 30 tablet 0  . pravastatin (PRAVACHOL) 20 MG tablet Take 20 mg by mouth every evening.     Marland Kitchen tiZANidine (ZANAFLEX) 4 MG tablet Take 1 tablet (4 mg total) by mouth every 6 (six) hours as needed for muscle spasms. 60 tablet 2  . traMADol (ULTRAM) 50 MG tablet Take 1 tablet (50 mg total) by mouth every 6 (six) hours as needed. 15 tablet 0  . trazodone (DESYREL) 300 MG tablet Take 300 mg by mouth at bedtime.    . vitamin C (ASCORBIC ACID) 500 MG tablet Take 500 mg by mouth daily.    . [DISCONTINUED] famotidine (PEPCID) 20 MG tablet Take 20 mg by mouth 2 (two) times daily.     Current Facility-Administered Medications on File Prior to Visit  Medication Dose Route Frequency Provider  Last Rate Last Dose  . triamcinolone acetonide (KENALOG) 10 MG/ML injection 10 mg  10 mg Other Once Landis Martins, DPM       Allergies  Allergen Reactions  . Lisinopril Cough  . Penicillins Itching  . Povidone-Iodine Itching  . Prednisone Nausea And Vomiting  . Betadine [Povidone Iodine] Rash   Objective: General: No acute distress, AAOx3  Right foot: Sutures intact with no gapping or dehiscence at surgical sites, moderate swelling to right foot with ecchymosis to lateral foot and heel, no erythema, no warmth, no drainage, no signs of  infection noted, there is mild dry scaly skin at interspaces on right, Capillary fill time <3 seconds in all digits, gross sensation present via light touch to right foot. No pain or crepitation with range of motion right foot.  No pain with calf compression.    Assessment and Plan:  Problem List Items Addressed This Visit    None    Visit Diagnoses    Right foot pain    -  Primary    Relevant Medications    oxyCODONE-acetaminophen (PERCOCET) 10-325 MG tablet    Status post right foot surgery        Partial ostectomy 5th met head and dorsal midfoot, 12-04-15, healing well    Relevant Medications    oxyCODONE-acetaminophen (PERCOCET) 10-325 MG tablet    Tinea pedis of right foot          -Patient seen and evaluated -Sutures removed and Applied dry sterile dressing to surgical site right foot secured with ACE wrap and stockinet  -Advised patient that she may now shower as normal and use ACE for compression removing as needed  -Advised patient to continue with post-op shoe on right foot with slow transition to normal shoe as swelling allows  -Advised patient to limit activity to necessity  -Advised patient to ice and elevate as necessary  -Refilled pain medication at today's encounter  -Awaiting pre-auth for Naftin for interdigital tinea in the meantime applied castinelli's paint to all interspaces -Patient to return in 2 weeks for post op care. In the meantime, patient to call office if any issues or problems arise.   Landis Martins, DPM

## 2015-12-28 ENCOUNTER — Ambulatory Visit: Payer: Medicaid Other | Admitting: Orthopedic Surgery

## 2016-01-01 ENCOUNTER — Ambulatory Visit (INDEPENDENT_AMBULATORY_CARE_PROVIDER_SITE_OTHER): Payer: Medicaid Other | Admitting: Sports Medicine

## 2016-01-01 ENCOUNTER — Ambulatory Visit (INDEPENDENT_AMBULATORY_CARE_PROVIDER_SITE_OTHER): Payer: Medicaid Other

## 2016-01-01 ENCOUNTER — Encounter: Payer: Self-pay | Admitting: Sports Medicine

## 2016-01-01 DIAGNOSIS — B353 Tinea pedis: Secondary | ICD-10-CM

## 2016-01-01 DIAGNOSIS — M79671 Pain in right foot: Secondary | ICD-10-CM | POA: Diagnosis not present

## 2016-01-01 DIAGNOSIS — Z9889 Other specified postprocedural states: Secondary | ICD-10-CM

## 2016-01-01 MED ORDER — OXYCODONE-ACETAMINOPHEN 10-325 MG PO TABS: 1.0000 | ORAL_TABLET | ORAL | Status: DC | PRN

## 2016-01-01 NOTE — Progress Notes (Signed)
Patient ID: Rachel [name], female   DOB: [birth year], 60 y.o.   MRN: [id number]  Subjective: Rachel [name] is a 60 y.o. female patient seen today in office for POV #3 (DOS 12-04-15), S/P Partial ostectomy 5th met head and dorsal midfoot, right foot. Patient admits to pain at surgical site that is getting better but still sore on top, unable to wear normal shoe, denies calf pain, denies headache, chest pain, shortness of breath, nausea, vomiting, fever, or chills. Admits to popping when she walks at her ankle. No other issues noted.   Patient Active Problem List   Diagnosis Date Noted  . Obesity 05/22/2015  . Mild protein-calorie malnutrition ([place]) 12/14/2013  . Dehydration 12/12/2013  . Colitis, acute 12/12/2013  . Hypokalemia 12/12/2013  . C. difficile colitis 12/11/2013  . Abdominal pain 11/07/2013  . Colitis 11/07/2013  . Acute colitis 11/07/2013  . Arthritis of knee, right 09/02/2013  . Osteoarthritis of left knee 04/13/2013  . OA (osteoarthritis) of knee 01/16/2012  . Acquired trigger finger 11/21/2011  . ARTHRITIS, LEFT KNEE 07/12/2010  . NECK PAIN 05/31/2010  . JOINT EFFUSION, LEFT KNEE 04/05/2010  . RUPTURE ROTATOR CUFF 05/03/2009  . THYROID STIMULATING HORMONE, ABNORMAL 04/19/2009  . IMPINGEMENT SYNDROME 03/29/2009  . NUMBNESS, ARM 03/29/2009  . SKIN RASH 03/08/2009  . HIP, ARTHRITIS, DEGEN./OSTEO 09/26/2008  . ALLERGIC RHINITIS 08/11/2008  . HIP PAIN, RIGHT 07/19/2008  . SYMPTOMATIC MENOPAUSAL/FEMALE CLIMACTERIC STATES 03/17/2008  . SHOULDER PAIN 01/25/2008  . HYPERLIPIDEMIA 09/27/2006  . OBESITY 09/27/2006  . ANXIETY 09/27/2006  . DEPRESSION 09/27/2006  . HYPERTENSION 09/27/2006  . OSTEOARTHRITIS 09/27/2006  . MEDIAL MENISCUS TEAR, LEFT 09/27/2006   Current Outpatient Prescriptions on File Prior to Visit  Medication Sig Dispense Refill  . ALPRAZolam (XANAX) 1 MG tablet Take 1 mg by mouth 3 (three) times daily as needed for sleep or anxiety.     Marland Kitchen  amLODipine (NORVASC) 10 MG tablet Take 10 mg by mouth every morning.     . citalopram (CELEXA) 40 MG tablet Take 40 mg by mouth every morning.    . cloNIDine (CATAPRES) 0.1 MG tablet Take 0.1 mg by mouth every evening.     . hydrochlorothiazide (HYDRODIURIL) 25 MG tablet Take 25 mg by mouth daily.    Marland Kitchen HYDROcodone-acetaminophen (NORCO) 5-325 MG tablet     . HYDROcodone-acetaminophen (NORCO/VICODIN) 5-325 MG per tablet Take 1 tablet by mouth every 4 (four) hours as needed. 20 tablet 0  . naftifine (NAFTIN) 1 % cream Apply topically daily. 30 g 0  . naproxen sodium (ALEVE) 220 MG tablet Take 220-440 mg by mouth daily as needed (for pain).    . pravastatin (PRAVACHOL) 20 MG tablet Take 20 mg by mouth every evening.     Marland Kitchen tiZANidine (ZANAFLEX) 4 MG tablet Take 1 tablet (4 mg total) by mouth every 6 (six) hours as needed for muscle spasms. 60 tablet 2  . traMADol (ULTRAM) 50 MG tablet Take 1 tablet (50 mg total) by mouth every 6 (six) hours as needed. 15 tablet 0  . trazodone (DESYREL) 300 MG tablet Take 300 mg by mouth at bedtime.    . vitamin C (ASCORBIC ACID) 500 MG tablet Take 500 mg by mouth daily.    . [DISCONTINUED] famotidine (PEPCID) 20 MG tablet Take 20 mg by mouth 2 (two) times daily.     Current Facility-Administered Medications on File Prior to Visit  Medication Dose Route Frequency Provider Last Rate Last Dose  . triamcinolone acetonide (KENALOG)  10 MG/ML injection 10 mg  10 mg Other Once Landis Martins, DPM       Allergies  Allergen Reactions  . Lisinopril Cough  . Penicillins Itching  . Povidone-Iodine Itching  . Prednisone Nausea And Vomiting  . Betadine [Povidone Iodine] Rash   Objective: General: No acute distress, AAOx3  Right foot: Incisions healing well with no gapping or dehiscence at surgical sites, moderate swelling to right foot, ecchymosis to lateral foot and heel is now resolved, no erythema, no warmth, no drainage, no signs of infection noted, there is mild dry  scaly skin at interspaces on right, Capillary fill time <3 seconds in all digits, gross sensation present via light touch to right foot. No pain or crepitation with range of motion right foot.  No pain with calf compression.   Xrays, Right foot: Significant arthritic changes diffusely, Ostectomy sites without recurrence at midfoot and 5th met head, no acute fracture/dislocation, soft tissues within normal limits for post op status.    Assessment and Plan:  Problem List Items Addressed This Visit    None    Visit Diagnoses    Right foot pain    -  Primary    Relevant Medications    oxyCODONE-acetaminophen (PERCOCET) 10-325 MG tablet    Other Relevant Orders    DG Foot Complete Right    Status post right foot surgery        Partial ostectomy 5th met head and dorsal midfoot, 12-04-15, healing well    Relevant Medications    oxyCODONE-acetaminophen (PERCOCET) 10-325 MG tablet    Tinea pedis of right foot        Improving      -Patient seen and evaluated -Xrays reviewed  -Continue with ACE for compression and edema control removing as needed  -Advised patient to continue with post-op shoe on right foot with slow transition to normal shoe as swelling allows  -Advised patient to limit activity to necessity  -Advised patient to ice and elevate as necessary  -Refilled pain medication at today's encounter  -Rx PT -Awaiting pre-auth for Naftin for interdigital tinea in the meantime applied castinelli's paint to all interspaces -Patient to return in 2 weeks for post op care. In the meantime, patient to call office if any issues or problems arise.   Landis Martins, DPM

## 2016-01-08 ENCOUNTER — Ambulatory Visit (INDEPENDENT_AMBULATORY_CARE_PROVIDER_SITE_OTHER): Payer: Medicaid Other | Admitting: Orthopedic Surgery

## 2016-01-08 VITALS — BP 111/46 | Ht 64.0 in | Wt 245.0 lb

## 2016-01-08 DIAGNOSIS — M1732 Unilateral post-traumatic osteoarthritis, left knee: Secondary | ICD-10-CM

## 2016-01-08 DIAGNOSIS — M129 Arthropathy, unspecified: Secondary | ICD-10-CM

## 2016-01-08 DIAGNOSIS — M1711 Unilateral primary osteoarthritis, right knee: Secondary | ICD-10-CM

## 2016-01-08 NOTE — Progress Notes (Signed)
Chief Complaint  Patient presents with  . Follow-up    FOLLOW UP + REPEAT BILATERAL KNEE INJECTIONS    Procedure note left knee injection verbal consent was obtained to inject left knee joint  Timeout was completed to confirm the site of injection  The medications used were 40 mg of Depo-Medrol and 1% lidocaine 3 cc  Anesthesia was provided by ethyl chloride and the skin was prepped with alcohol.  After cleaning the skin with alcohol a 20-gauge needle was used to inject the left knee joint. There were no complications. A sterile bandage was applied.   Procedure note right knee injection verbal consent was obtained to inject right knee joint  Timeout was completed to confirm the site of injection  The medications used were 40 mg of Depo-Medrol and 1% lidocaine 3 cc  Anesthesia was provided by ethyl chloride and the skin was prepped with alcohol.  After cleaning the skin with alcohol a 20-gauge needle was used to inject the right knee joint. There were no complications. A sterile bandage was applied.

## 2016-01-08 NOTE — Progress Notes (Signed)
Patient ID: Rachel [name], female   DOB: February 04, 1956, 60 y.o.   MRN: [id number]   Dr Cannon Kettle performed a right foot removal of bone/exostosis at 5th metatarsal head and dorsal midfoot on 12/04/15 at [facility]

## 2016-01-09 ENCOUNTER — Ambulatory Visit: Payer: Medicaid Other | Admitting: Orthopedic Surgery

## 2016-01-11 ENCOUNTER — Telehealth: Payer: Self-pay | Admitting: *Deleted

## 2016-01-11 NOTE — Telephone Encounter (Addendum)
[place] [phone] states pt gets Percocet from Dr. Cannon Kettle, and other pain medications from Dr. Berdine Addison, please advise.  01/12/2016-DrCannon Kettle states will discuss pt's medications with Dr. Berdine Addison.  I informed [employer].

## 2016-01-11 NOTE — Telephone Encounter (Signed)
Patient gets Percocet for her post op pain. I can definitely talk or work with Dr. Berdine Addison to discuss the pain meds that are being given to see if we can change or discontinue any medications that are not necessary.  -Dr. Cannon Kettle

## 2016-01-15 ENCOUNTER — Ambulatory Visit (INDEPENDENT_AMBULATORY_CARE_PROVIDER_SITE_OTHER): Payer: Medicaid Other | Admitting: Sports Medicine

## 2016-01-15 ENCOUNTER — Encounter: Payer: Self-pay | Admitting: Sports Medicine

## 2016-01-15 DIAGNOSIS — Z9889 Other specified postprocedural states: Secondary | ICD-10-CM

## 2016-01-15 DIAGNOSIS — B353 Tinea pedis: Secondary | ICD-10-CM

## 2016-01-15 DIAGNOSIS — M79671 Pain in right foot: Secondary | ICD-10-CM

## 2016-01-15 MED ORDER — OXYCODONE-ACETAMINOPHEN 10-325 MG PO TABS: 1.0000 | ORAL_TABLET | ORAL | Status: DC | PRN

## 2016-01-15 NOTE — Progress Notes (Signed)
Patient ID: Rachel [name], female   DOB: 04/30/1956, 60 y.o.   MRN: [id number] Subjective: Rachel [name] is a 60 y.o. female patient seen today in office for POV #4 (DOS 12-04-15), S/P Partial ostectomy 5th met head and dorsal midfoot, right foot. Patient admits to pain at surgical site that is getting better but still sore on top, unable to wear normal shoe, denies calf pain, denies headache, chest pain, shortness of breath, nausea, vomiting, fever, or chills. No other issues noted.   Patient Active Problem List   Diagnosis Date Noted  . Obesity 05/22/2015  . Mild protein-calorie malnutrition ([place]) 12/14/2013  . Dehydration 12/12/2013  . Colitis, acute 12/12/2013  . Hypokalemia 12/12/2013  . C. difficile colitis 12/11/2013  . Abdominal pain 11/07/2013  . Colitis 11/07/2013  . Acute colitis 11/07/2013  . Arthritis of knee, right 09/02/2013  . Osteoarthritis of left knee 04/13/2013  . OA (osteoarthritis) of knee 01/16/2012  . Acquired trigger finger 11/21/2011  . ARTHRITIS, LEFT KNEE 07/12/2010  . NECK PAIN 05/31/2010  . JOINT EFFUSION, LEFT KNEE 04/05/2010  . RUPTURE ROTATOR CUFF 05/03/2009  . THYROID STIMULATING HORMONE, ABNORMAL 04/19/2009  . IMPINGEMENT SYNDROME 03/29/2009  . NUMBNESS, ARM 03/29/2009  . SKIN RASH 03/08/2009  . HIP, ARTHRITIS, DEGEN./OSTEO 09/26/2008  . ALLERGIC RHINITIS 08/11/2008  . HIP PAIN, RIGHT 07/19/2008  . SYMPTOMATIC MENOPAUSAL/FEMALE CLIMACTERIC STATES 03/17/2008  . SHOULDER PAIN 01/25/2008  . HYPERLIPIDEMIA 09/27/2006  . OBESITY 09/27/2006  . ANXIETY 09/27/2006  . DEPRESSION 09/27/2006  . HYPERTENSION 09/27/2006  . OSTEOARTHRITIS 09/27/2006  . MEDIAL MENISCUS TEAR, LEFT 09/27/2006   Current Outpatient Prescriptions on File Prior to Visit  Medication Sig Dispense Refill  . ALPRAZolam (XANAX) 1 MG tablet Take 1 mg by mouth 3 (three) times daily as needed for sleep or anxiety.     Marland Kitchen amLODipine (NORVASC) 10 MG tablet Take 10 mg by mouth  every morning.     . citalopram (CELEXA) 40 MG tablet Take 40 mg by mouth every morning.    . cloNIDine (CATAPRES) 0.1 MG tablet Take 0.1 mg by mouth every evening.     . docusate sodium (COLACE) 100 MG capsule Take 100 mg by mouth 2 (two) times daily.    . hydrochlorothiazide (HYDRODIURIL) 25 MG tablet Take 25 mg by mouth daily.    Marland Kitchen HYDROcodone-acetaminophen (NORCO) 5-325 MG tablet     . HYDROcodone-acetaminophen (NORCO/VICODIN) 5-325 MG per tablet Take 1 tablet by mouth every 4 (four) hours as needed. 20 tablet 0  . naftifine (NAFTIN) 1 % cream Apply topically daily. 30 g 0  . naproxen sodium (ALEVE) 220 MG tablet Take 220-440 mg by mouth daily as needed (for pain).    Marland Kitchen oxyCODONE-acetaminophen (PERCOCET) 10-325 MG tablet Take 1 tablet by mouth every 4 (four) hours as needed for pain. 30 tablet 0  . pravastatin (PRAVACHOL) 20 MG tablet Take 20 mg by mouth every evening.     . promethazine (PHENERGAN) 25 MG tablet Take 25 mg by mouth every 6 (six) hours as needed for nausea or vomiting.    Marland Kitchen tiZANidine (ZANAFLEX) 4 MG tablet Take 1 tablet (4 mg total) by mouth every 6 (six) hours as needed for muscle spasms. 60 tablet 2  . traMADol (ULTRAM) 50 MG tablet Take 1 tablet (50 mg total) by mouth every 6 (six) hours as needed. 15 tablet 0  . trazodone (DESYREL) 300 MG tablet Take 300 mg by mouth at bedtime.    . vitamin C (ASCORBIC  ACID) 500 MG tablet Take 500 mg by mouth daily.    . [DISCONTINUED] famotidine (PEPCID) 20 MG tablet Take 20 mg by mouth 2 (two) times daily.     Current Facility-Administered Medications on File Prior to Visit  Medication Dose Route Frequency Provider Last Rate Last Dose  . triamcinolone acetonide (KENALOG) 10 MG/ML injection 10 mg  10 mg Other Once Landis Martins, DPM       Allergies  Allergen Reactions  . Lisinopril Cough  . Penicillins Itching  . Povidone-Iodine Itching  . Prednisone Nausea And Vomiting  . Betadine [Povidone Iodine] Rash   Objective: General:  No acute distress, AAOx3 Cane assisted gait Right foot: Incisions healed at surgical sites, mild swelling to right foot, no ecchymosis, no erythema, no warmth, no drainage, no signs of infection noted, there is mild dry scaly skin at interspaces on right, Capillary fill time <3 seconds in all digits, gross sensation present via light touch to right foot. No pain or crepitation with range of motion right foot.  No pain with calf compression.    Assessment and Plan:  Problem List Items Addressed This Visit    None    Visit Diagnoses    Status post right foot surgery    -  Primary    Relevant Medications    oxyCODONE-acetaminophen (PERCOCET) 10-325 MG tablet    Tinea pedis of right foot        Right foot pain        Relevant Medications    oxyCODONE-acetaminophen (PERCOCET) 10-325 MG tablet      -Patient seen and evaluated -Continue with ACE for compression and edema control removing as needed  -Advised patient to continue with post-op shoe on right foot with slow transition to normal shoe as swelling allows  -Advised patient to limit activity to necessity  -Advised patient to ice and elevate as necessary  -Cont with Percocet as needed for pain; Refilled at today's encounter; States that she does not take the [place] that was given to her by Dr. Berdine Addison the percocet medication helps; Advised patient that she can no longer get medication from me and Dr. Berdine Addison. Dr. Cathey Endow rx must be discontinued; patient expressed understanding -Cont with PT; Patient to start on tomorrow -Awaiting pre-auth for Naftin for interdigital tinea in the meantime applied castinelli's paint to all interspaces -Patient to return in 4 weeks for post op care. In the meantime, patient to call office if any issues or problems arise.   Landis Martins, DPM

## 2016-01-16 ENCOUNTER — Ambulatory Visit: Payer: Medicaid Other | Attending: Family Medicine | Admitting: Physical Therapy

## 2016-01-16 DIAGNOSIS — M25571 Pain in right ankle and joints of right foot: Secondary | ICD-10-CM | POA: Insufficient documentation

## 2016-01-16 DIAGNOSIS — R262 Difficulty in walking, not elsewhere classified: Secondary | ICD-10-CM

## 2016-01-16 DIAGNOSIS — M25671 Stiffness of right ankle, not elsewhere classified: Secondary | ICD-10-CM | POA: Diagnosis present

## 2016-01-16 DIAGNOSIS — R600 Localized edema: Secondary | ICD-10-CM

## 2016-01-16 NOTE — Patient Instructions (Signed)
ROM: Plantar / Dorsiflexion    With left leg relaxed, gently flex and extend ankle. Move through full range of motion. Avoid pain. Repeat ___10-20_ times per set. Do _1-2___ sets per session. Do __2__ sessions per day.  http://orth.exer.us/35   Copyright  VHI. All rights reserved.  ROM: Inversion / Eversion    With left leg relaxed, gently turn ankle and foot in and out. Move through full range of motion. Avoid pain. Repeat ____10-20 times per set. Do __1-2__ sets per session. Do __2 sessions per day.  http://orth.exer.us/37   Copyright  VHI. All rights reserved.     Ankle Circles    Slowly rotate right foot and ankle clockwise then counterclockwise. Gradually increase range of motion. Avoid pain. Circle __10-20__ times each direction per set. Do ___2_ sets per session. Do __2__ sessions per day.  http://orth.exer.us/31   Copyright  VHI. All rights reserved.
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
## 2016-02-05 ENCOUNTER — Ambulatory Visit: Payer: Medicaid Other | Attending: Family Medicine

## 2016-02-05 DIAGNOSIS — R262 Difficulty in walking, not elsewhere classified: Secondary | ICD-10-CM | POA: Insufficient documentation

## 2016-02-05 DIAGNOSIS — R600 Localized edema: Secondary | ICD-10-CM | POA: Diagnosis present

## 2016-02-05 DIAGNOSIS — M25571 Pain in right ankle and joints of right foot: Secondary | ICD-10-CM | POA: Diagnosis present

## 2016-02-05 NOTE — Patient Instructions (Signed)
SEATED Gastroc / Heel Cord Stretch - Seated With Towel   Sit on floor, towel around ball of foot. Gently pull foot in toward body, stretching heel cord and calf. Hold for _30__ seconds. Repeat on involved leg. Repeat __3_ times. Do _3__ times per day.  SEATED Soleus Stretch: ANKLE: Dorsiflexion - Sitting   Sitting, place strap around foot. Pull foot toward body, keeping heel on floor. Keep foot straight. Hold _30__ seconds. _3__ reps per set, __3_ sets per day, _7__ days per week     Dorsiflexion: Self-Mobilization (Sitting)   Feet flat, other foot forward, slide left foot back until gentle stretch is felt. Keep entire foot on floor. Hold _30___ seconds. Relax. Repeat __3__ times per set. Do __3__ sessions per day.  http://orth.exer.us/82    ROM: Inversion / Eversion   With left leg relaxed, gently turn ankle and foot in and out. Move through full range of motion. Avoid pain. Repeat __20__ times per set. Do __2__ sets per session. Do _3___ sessions per day.  http://orth.exer.us/36   Copyright  VHI. All rights reserved.  ROM: Plantar / Dorsiflexion   With left leg relaxed, gently flex and extend ankle. Move through full range of motion. Avoid pain. Repeat ____ times per set. Do ____ sets per session. Do ____ sessions per day.  http://orth.exer.us/34   Copyright  VHI. All rights reserved.  Ankle Alphabet   Using left ankle and foot only, trace the letters of the alphabet. Perform A to Z. Repeat ____ times per set. Do ____ sets per session. Do ____ sessions per day.  http://orth.exer.us/16   Copyright  VHI. All rights reserved.  Ankle Circles   Slowly rotate right foot and ankle clockwise then counterclockwise. Gradually increase range of motion. Avoid pain. Circle ____ times each direction per set. Do ____ sets per session. Do ____ sessions per day.  http://orth.exer.us/30   Copyright  VHI. All rights reserved.
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
## 2016-02-06 DIAGNOSIS — M170 Bilateral primary osteoarthritis of knee: Secondary | ICD-10-CM | POA: Insufficient documentation

## 2016-02-12 ENCOUNTER — Encounter: Payer: Medicaid Other | Admitting: Physical Therapy

## 2016-02-19 ENCOUNTER — Encounter: Payer: Self-pay | Admitting: Sports Medicine

## 2016-02-19 ENCOUNTER — Ambulatory Visit (INDEPENDENT_AMBULATORY_CARE_PROVIDER_SITE_OTHER): Payer: Medicaid Other | Admitting: Sports Medicine

## 2016-02-19 DIAGNOSIS — M778 Other enthesopathies, not elsewhere classified: Secondary | ICD-10-CM

## 2016-02-19 DIAGNOSIS — Z9889 Other specified postprocedural states: Secondary | ICD-10-CM

## 2016-02-19 DIAGNOSIS — M779 Enthesopathy, unspecified: Secondary | ICD-10-CM

## 2016-02-19 DIAGNOSIS — M7750 Other enthesopathy of unspecified foot: Secondary | ICD-10-CM | POA: Diagnosis not present

## 2016-02-19 DIAGNOSIS — M79671 Pain in right foot: Secondary | ICD-10-CM

## 2016-02-19 MED ORDER — TRIAMCINOLONE ACETONIDE 10 MG/ML IJ SUSP: 10.0000 mg | Freq: Once | INTRAMUSCULAR | Status: DC

## 2016-02-19 MED ORDER — OXYCODONE-ACETAMINOPHEN 10-325 MG PO TABS: 1.0000 | ORAL_TABLET | ORAL | Status: DC | PRN

## 2016-02-19 NOTE — Progress Notes (Signed)
Patient ID: Rachel [name], female   DOB: 07/12/1956, 60 y.o.   MRN: [id number]   Subjective: Rachel [name] is a 60 y.o. female patient seen today in office for POV #5 (DOS 12-04-15), S/P Partial ostectomy 5th met head and dorsal midfoot, right foot. Patient admits to pain at surgical site that is getting better but still sore on top, hurts with normal shoe, denies calf pain, denies headache, chest pain, shortness of breath, nausea, vomiting, fever, or chills. Patient is in PT and is making functional progress. No other issues noted.   Patient Active Problem List   Diagnosis Date Noted  . Obesity 05/22/2015  . Mild protein-calorie malnutrition ([facility]) 12/14/2013  . Dehydration 12/12/2013  . Colitis, acute 12/12/2013  . Hypokalemia 12/12/2013  . C. difficile colitis 12/11/2013  . Abdominal pain 11/07/2013  . Colitis 11/07/2013  . Acute colitis 11/07/2013  . Arthralgia of hip 09/30/2013  . Degenerative arthritis of hip 09/30/2013  . Arthritis of knee, right 09/02/2013  . Osteoarthritis of left knee 04/13/2013  . OA (osteoarthritis) of knee 01/16/2012  . Acquired trigger finger 11/21/2011  . ARTHRITIS, LEFT KNEE 07/12/2010  . NECK PAIN 05/31/2010  . JOINT EFFUSION, LEFT KNEE 04/05/2010  . RUPTURE ROTATOR CUFF 05/03/2009  . THYROID STIMULATING HORMONE, ABNORMAL 04/19/2009  . IMPINGEMENT SYNDROME 03/29/2009  . NUMBNESS, ARM 03/29/2009  . SKIN RASH 03/08/2009  . HIP, ARTHRITIS, DEGEN./OSTEO 09/26/2008  . ALLERGIC RHINITIS 08/11/2008  . HIP PAIN, RIGHT 07/19/2008  . SYMPTOMATIC MENOPAUSAL/FEMALE CLIMACTERIC STATES 03/17/2008  . SHOULDER PAIN 01/25/2008  . HYPERLIPIDEMIA 09/27/2006  . OBESITY 09/27/2006  . ANXIETY 09/27/2006  . DEPRESSION 09/27/2006  . HYPERTENSION 09/27/2006  . OSTEOARTHRITIS 09/27/2006  . MEDIAL MENISCUS TEAR, LEFT 09/27/2006   Current Outpatient Prescriptions on File Prior to Visit  Medication Sig Dispense Refill  . ALPRAZolam (XANAX) 1 MG tablet Take 1  mg by mouth 3 (three) times daily as needed for sleep or anxiety.     . amLODipine (NORVASC) 10 MG tablet Take 10 mg by mouth every morning.     . citalopram (CELEXA) 40 MG tablet Take 40 mg by mouth every morning.    . cloNIDine (CATAPRES) 0.1 MG tablet Take 0.1 mg by mouth every evening.     . docusate sodium (COLACE) 100 MG capsule Take 100 mg by mouth 2 (two) times daily.    . hydrochlorothiazide (HYDRODIURIL) 25 MG tablet Take 25 mg by mouth daily.    . HYDROcodone-acetaminophen (NORCO) 5-325 MG tablet Reported on 01/16/2016    . HYDROcodone-acetaminophen (NORCO/VICODIN) 5-325 MG per tablet Take 1 tablet by mouth every 4 (four) hours as needed. (Patient not taking: Reported on 01/16/2016) 20 tablet 0  . naftifine (NAFTIN) 1 % cream Apply topically daily. (Patient not taking: Reported on 01/16/2016) 30 g 0  . naproxen sodium (ALEVE) 220 MG tablet Take 220-440 mg by mouth daily as needed (for pain). Reported on 01/16/2016    . pravastatin (PRAVACHOL) 20 MG tablet Take 20 mg by mouth every evening.     . promethazine (PHENERGAN) 25 MG tablet Take 25 mg by mouth every 6 (six) hours as needed for nausea or vomiting.    . tiZANidine (ZANAFLEX) 4 MG tablet Take 1 tablet (4 mg total) by mouth every 6 (six) hours as needed for muscle spasms. 60 tablet 2  . traMADol (ULTRAM) 50 MG tablet Take 1 tablet (50 mg total) by mouth every 6 (six) hours as needed. 15 tablet 0  . trazodone (DESYREL)   300 MG tablet Take 300 mg by mouth at bedtime.    . vitamin C (ASCORBIC ACID) 500 MG tablet Take 500 mg by mouth daily.    . [DISCONTINUED] famotidine (PEPCID) 20 MG tablet Take 20 mg by mouth 2 (two) times daily.     Current Facility-Administered Medications on File Prior to Visit  Medication Dose Route Frequency Provider Last Rate Last Dose  . triamcinolone acetonide (KENALOG) 10 MG/ML injection 10 mg  10 mg Other Once Landis Martins, DPM       Allergies  Allergen Reactions  . Lisinopril Cough  . Penicillins  Itching  . Povidone-Iodine Itching  . Prednisone Nausea And Vomiting  . Betadine [Povidone Iodine] Rash   Objective: General: No acute distress, AAOx3 Cane assisted gait Right foot: Incisions healed at surgical sites, decreased swelling to right foot, no ecchymosis, no erythema, no warmth, no drainage, no signs of infection noted, there is improved dry scaly skin at interspaces on right, Capillary fill time <3 seconds in all digits, gross sensation present via light touch to right foot. No pain or crepitation with range of motion right foot however there is pain with palpation to dorsal midfoot on right.  No pain with calf compression.    Assessment and Plan:  Problem List Items Addressed This Visit    None    Visit Diagnoses    Right foot pain    -  Primary    Relevant Medications    oxyCODONE-acetaminophen (PERCOCET) 10-325 MG tablet    triamcinolone acetonide (KENALOG) 10 MG/ML injection 10 mg    Status post right foot surgery        Partial ostectomy 5th met head and dorsal midfoot, right 12-04-15    Relevant Medications    oxyCODONE-acetaminophen (PERCOCET) 10-325 MG tablet    triamcinolone acetonide (KENALOG) 10 MG/ML injection 10 mg    Capsulitis of foot, right        Relevant Medications    oxyCODONE-acetaminophen (PERCOCET) 10-325 MG tablet    triamcinolone acetonide (KENALOG) 10 MG/ML injection 10 mg       -Patient seen and evaluated After oral consent and aseptic prep, injected a mixture containing 1 ml of 2%  plain lidocaine, 1 ml 0.5% plain marcaine, 0.5 ml of kenalog 10 and 0.5 ml of dexamethasone phosphate into right dorsal midfoot at point of max tenderness without complication. Post-injection care discussed with patient.  -Advised patient to ice and elevate as necessary  -Cont with Percocet as needed for pain; Refilled at today's encounter -Continue with good supportive shoes that do not rub or irritate the dorsum of the right foot  -Cont with PT until completed   -Awaiting pre-auth for Naftin for interdigital tinea in the meantime applied castinelli's paint to all interspaces -Patient to return in 6 weeks for post op care. In the meantime, patient to call office if any issues or problems arise.   Landis Martins, DPM

## 2016-02-20 ENCOUNTER — Ambulatory Visit: Payer: Medicaid Other | Admitting: Physical Therapy

## 2016-02-20 DIAGNOSIS — M25571 Pain in right ankle and joints of right foot: Secondary | ICD-10-CM

## 2016-02-20 DIAGNOSIS — R600 Localized edema: Secondary | ICD-10-CM

## 2016-02-20 DIAGNOSIS — R262 Difficulty in walking, not elsewhere classified: Secondary | ICD-10-CM

## 2016-02-20 NOTE — Patient Instructions (Signed)
Gastroc / Heel Cord Stretch - Seated With Towel   Sit on floor, towel around ball of foot. Gently pull foot in toward body, stretching heel cord and calf. Hold for _30 __ seconds. Repeat on involved leg. Repeat 3-5___ times. Do _2__ times per day.   Forefoot Evertors   Place right foot flat on towel, knee pointed forward. Use forefoot and toes to push towel out to side. Do not allow heel or knee to move. Hold _5___ seconds. Repeat ___10_ times. Do ___1-2_ sessions per day. CAUTION: Repetitions should be slow and controlled.  Forefoot Invertors   Place right foot flat on towel, knee pointed forward. Use forefoot and toes to pull towel in toward center. Do not allow heel or knee to move. Hold __5__ seconds. Repeat ___10_ times. Do ____1-2 sessions per day. CAUTION: Repetitions should be slow and controlled.   TOES: Towel Bunching   With involved straight toes on towel, bend toes bunching up towel __10-20_ times. Do __2_ times per day.  Copyright  VHI. All rights reserved.    Heel Raise: Bilateral (Standing)  HOLD ON FOR BALANCE    Rise on balls of feet. Repeat __10__ times per set. Do _1-2___ sets per session. Do __1-2__ sessions per day.  http://orth.exer.us/39   Copyright  VHI. All rights reserved.
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
## 2016-02-27 ENCOUNTER — Ambulatory Visit: Payer: Medicaid Other | Admitting: Physical Therapy

## 2016-02-29 ENCOUNTER — Ambulatory Visit (INDEPENDENT_AMBULATORY_CARE_PROVIDER_SITE_OTHER): Payer: Medicaid Other | Admitting: Otolaryngology

## 2016-03-01 ENCOUNTER — Emergency Department (HOSPITAL_COMMUNITY)
Admission: EM | Admit: 2016-03-01 | Discharge: 2016-03-01 | Disposition: A | Discharge status: Home / Self Care | Payer: Medicaid Other | Attending: Emergency Medicine | Admitting: Emergency Medicine

## 2016-03-01 ENCOUNTER — Emergency Department (HOSPITAL_COMMUNITY): Payer: Medicaid Other

## 2016-03-01 ENCOUNTER — Encounter (HOSPITAL_COMMUNITY): Payer: Self-pay | Admitting: Emergency Medicine

## 2016-03-01 DIAGNOSIS — F329 Major depressive disorder, single episode, unspecified: Secondary | ICD-10-CM | POA: Diagnosis not present

## 2016-03-01 DIAGNOSIS — R1084 Generalized abdominal pain: Secondary | ICD-10-CM | POA: Diagnosis present

## 2016-03-01 DIAGNOSIS — E785 Hyperlipidemia, unspecified: Secondary | ICD-10-CM | POA: Diagnosis not present

## 2016-03-01 DIAGNOSIS — I10 Essential (primary) hypertension: Secondary | ICD-10-CM | POA: Insufficient documentation

## 2016-03-01 DIAGNOSIS — E669 Obesity, unspecified: Secondary | ICD-10-CM | POA: Diagnosis not present

## 2016-03-01 DIAGNOSIS — M1990 Unspecified osteoarthritis, unspecified site: Secondary | ICD-10-CM | POA: Insufficient documentation

## 2016-03-01 DIAGNOSIS — R197 Diarrhea, unspecified: Secondary | ICD-10-CM | POA: Insufficient documentation

## 2016-03-01 LAB — URINALYSIS, ROUTINE W REFLEX MICROSCOPIC
BILIRUBIN URINE: NEGATIVE
GLUCOSE, UA: NEGATIVE mg/dL
Hgb urine dipstick: NEGATIVE
KETONES UR: NEGATIVE mg/dL
NITRITE: NEGATIVE
PH: 5.5 (ref 5.0–8.0)
Protein, ur: NEGATIVE mg/dL
Specific Gravity, Urine: 1.02 (ref 1.005–1.030)

## 2016-03-01 LAB — CBC
HEMATOCRIT: 36.2 % (ref 36.0–46.0)
Hemoglobin: 12 g/dL (ref 12.0–15.0)
MCH: 30.2 pg (ref 26.0–34.0)
MCHC: 33.1 g/dL (ref 30.0–36.0)
MCV: 91.2 fL (ref 78.0–100.0)
Platelets: 234 10*3/uL (ref 150–400)
RBC: 3.97 MIL/uL (ref 3.87–5.11)
RDW: 13.1 % (ref 11.5–15.5)
WBC: 6.4 10*3/uL (ref 4.0–10.5)

## 2016-03-01 LAB — COMPREHENSIVE METABOLIC PANEL
ALBUMIN: 3.7 g/dL (ref 3.5–5.0)
ALT: 13 U/L — AB (ref 14–54)
AST: 15 U/L (ref 15–41)
Alkaline Phosphatase: 58 U/L (ref 38–126)
Anion gap: 7 (ref 5–15)
BILIRUBIN TOTAL: 0.5 mg/dL (ref 0.3–1.2)
BUN: 15 mg/dL (ref 6–20)
CO2: 25 mmol/L (ref 22–32)
CREATININE: 0.92 mg/dL (ref 0.44–1.00)
Calcium: 8.9 mg/dL (ref 8.9–10.3)
Chloride: 106 mmol/L (ref 101–111)
GFR calc Af Amer: >60 mL/min (ref >60)
GFR calc non Af Amer: >60 mL/min (ref >60)
GLUCOSE: 87 mg/dL (ref 65–99)
POTASSIUM: 3.6 mmol/L (ref 3.5–5.1)
Sodium: 138 mmol/L (ref 135–145)
TOTAL PROTEIN: 7.4 g/dL (ref 6.5–8.1)

## 2016-03-01 LAB — URINE MICROSCOPIC-ADD ON: RBC / HPF: NONE SEEN RBC/hpf (ref 0–5)

## 2016-03-01 LAB — LIPASE, BLOOD: LIPASE: 34 U/L (ref 11–51)

## 2016-03-01 MED ORDER — MORPHINE SULFATE (PF) 4 MG/ML IV SOLN
4.0000 mg | Freq: Once | INTRAVENOUS | Status: AC
  Administered 2016-03-01: 4 mg via INTRAVENOUS
  Filled 2016-03-01: qty 1

## 2016-03-01 MED ORDER — ONDANSETRON HCL 8 MG PO TABS: 8.0000 mg | ORAL_TABLET | ORAL | Status: DC | PRN

## 2016-03-01 MED ORDER — IOPAMIDOL (ISOVUE-300) INJECTION 61%
100.0000 mL | Freq: Once | INTRAVENOUS | Status: AC | PRN
  Administered 2016-03-01: 100 mL via INTRAVENOUS

## 2016-03-01 MED ORDER — SODIUM CHLORIDE 0.9 % IV BOLUS (SEPSIS)
1000.0000 mL | Freq: Once | INTRAVENOUS | Status: AC
  Administered 2016-03-01: 1000 mL via INTRAVENOUS

## 2016-03-01 MED ORDER — OXYCODONE-ACETAMINOPHEN 5-325 MG PO TABS: 1.0000 | ORAL_TABLET | ORAL | Status: DC | PRN

## 2016-03-01 MED ORDER — DIATRIZOATE MEGLUMINE & SODIUM 66-10 % PO SOLN
ORAL | Status: DC
Start: 2016-03-01 — End: 2016-03-02
  Filled 2016-03-01: qty 30

## 2016-03-01 MED ORDER — ONDANSETRON HCL 4 MG/2ML IJ SOLN
4.0000 mg | Freq: Once | INTRAMUSCULAR | Status: AC
  Administered 2016-03-01: 4 mg via INTRAVENOUS
  Filled 2016-03-01: qty 2

## 2016-03-01 NOTE — ED Notes (Signed)
Pt reports generalized abdominal pain x2 days. Pt reports emesis x1. Denies diarrhea,fever.
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
## 2016-03-01 NOTE — Discharge Instructions (Signed)
CT scan showed no acute findings. Medication for pain and nausea. Follow-up your primary care doctor. °

## 2016-03-28 ENCOUNTER — Ambulatory Visit (INDEPENDENT_AMBULATORY_CARE_PROVIDER_SITE_OTHER): Payer: Medicaid Other | Admitting: Otolaryngology

## 2016-03-28 DIAGNOSIS — H9312 Tinnitus, left ear: Secondary | ICD-10-CM

## 2016-03-28 DIAGNOSIS — H6982 Other specified disorders of Eustachian tube, left ear: Secondary | ICD-10-CM

## 2016-03-28 DIAGNOSIS — H903 Sensorineural hearing loss, bilateral: Secondary | ICD-10-CM | POA: Diagnosis not present

## 2016-03-28 DIAGNOSIS — R040 Epistaxis: Secondary | ICD-10-CM | POA: Diagnosis not present

## 2016-04-01 ENCOUNTER — Ambulatory Visit: Payer: Medicaid Other | Admitting: Sports Medicine

## 2016-04-08 ENCOUNTER — Ambulatory Visit: Payer: Medicaid Other | Admitting: Sports Medicine

## 2016-04-08 ENCOUNTER — Encounter: Payer: Self-pay | Admitting: Orthopedic Surgery

## 2016-04-08 ENCOUNTER — Ambulatory Visit (INDEPENDENT_AMBULATORY_CARE_PROVIDER_SITE_OTHER): Payer: Medicaid Other | Admitting: Orthopedic Surgery

## 2016-04-08 VITALS — BP 117/55 | Ht 64.0 in | Wt 240.0 lb

## 2016-04-08 DIAGNOSIS — M1732 Unilateral post-traumatic osteoarthritis, left knee: Secondary | ICD-10-CM | POA: Diagnosis not present

## 2016-04-08 DIAGNOSIS — M129 Arthropathy, unspecified: Secondary | ICD-10-CM

## 2016-04-08 DIAGNOSIS — M1711 Unilateral primary osteoarthritis, right knee: Secondary | ICD-10-CM

## 2016-04-08 NOTE — Patient Instructions (Signed)

## 2016-04-08 NOTE — Progress Notes (Signed)
  Procedure note left knee injection verbal consent was obtained to inject left knee joint  Timeout was completed to confirm the site of injection  The medications used were 40 mg of Depo-Medrol and 1% lidocaine 3 cc  Anesthesia was provided by ethyl chloride and the skin was prepped with alcohol.  After cleaning the skin with alcohol a 20-gauge needle was used to inject the left knee joint. There were no complications. A sterile bandage was applied.   Procedure note right knee injection verbal consent was obtained to inject right knee joint  Timeout was completed to confirm the site of injection  The medications used were 40 mg of Depo-Medrol and 1% lidocaine 3 cc  Anesthesia was provided by ethyl chloride and the skin was prepped with alcohol.  After cleaning the skin with alcohol a 20-gauge needle was used to inject the right knee joint. There were no complications. A sterile bandage was applied.   

## 2016-04-16 ENCOUNTER — Ambulatory Visit (INDEPENDENT_AMBULATORY_CARE_PROVIDER_SITE_OTHER): Payer: Medicaid Other | Admitting: Sports Medicine

## 2016-04-16 ENCOUNTER — Encounter: Payer: Self-pay | Admitting: Sports Medicine

## 2016-04-16 DIAGNOSIS — M2570 Osteophyte, unspecified joint: Secondary | ICD-10-CM

## 2016-04-16 DIAGNOSIS — M79671 Pain in right foot: Secondary | ICD-10-CM | POA: Diagnosis not present

## 2016-04-16 DIAGNOSIS — M25571 Pain in right ankle and joints of right foot: Secondary | ICD-10-CM

## 2016-04-16 DIAGNOSIS — M25471 Effusion, right ankle: Secondary | ICD-10-CM

## 2016-04-16 DIAGNOSIS — Z9889 Other specified postprocedural states: Secondary | ICD-10-CM

## 2016-04-16 MED ORDER — HYDROCODONE-ACETAMINOPHEN 5-325 MG PO TABS: 1.0000 | ORAL_TABLET | ORAL | Status: DC | PRN

## 2016-04-16 NOTE — Progress Notes (Signed)
Patient ID: Rachel [name], female   DOB: 11-28-1955, 60 y.o.   MRN: [id number]  Subjective: Rachel [name] is a 60 y.o. female patient seen today in office for POV #6 (DOS 12-04-15), S/P Partial ostectomy 5th met head and dorsal midfoot, right foot. Patient admits to pain at surgical site that is getting better but still sore on top, hurts with normal shoe has been using crocs with relief. Reports that her ankle pain that is more bothersome, denies calf pain, denies headache, chest pain, shortness of breath, nausea, vomiting, fever, or chills. Patient finished PT 3 sessions. No other issues noted.   Patient Active Problem List   Diagnosis Date Noted  . Obesity 05/22/2015  . Mild protein-calorie malnutrition ([place]) 12/14/2013  . Dehydration 12/12/2013  . Colitis, acute 12/12/2013  . Hypokalemia 12/12/2013  . C. difficile colitis 12/11/2013  . Abdominal pain 11/07/2013  . Colitis 11/07/2013  . Acute colitis 11/07/2013  . Arthralgia of hip 09/30/2013  . Degenerative arthritis of hip 09/30/2013  . Arthritis of knee, right 09/02/2013  . Osteoarthritis of left knee 04/13/2013  . OA (osteoarthritis) of knee 01/16/2012  . Acquired trigger finger 11/21/2011  . ARTHRITIS, LEFT KNEE 07/12/2010  . NECK PAIN 05/31/2010  . JOINT EFFUSION, LEFT KNEE 04/05/2010  . RUPTURE ROTATOR CUFF 05/03/2009  . THYROID STIMULATING HORMONE, ABNORMAL 04/19/2009  . IMPINGEMENT SYNDROME 03/29/2009  . NUMBNESS, ARM 03/29/2009  . SKIN RASH 03/08/2009  . HIP, ARTHRITIS, DEGEN./OSTEO 09/26/2008  . ALLERGIC RHINITIS 08/11/2008  . HIP PAIN, RIGHT 07/19/2008  . SYMPTOMATIC MENOPAUSAL/FEMALE CLIMACTERIC STATES 03/17/2008  . SHOULDER PAIN 01/25/2008  . HYPERLIPIDEMIA 09/27/2006  . OBESITY 09/27/2006  . ANXIETY 09/27/2006  . DEPRESSION 09/27/2006  . HYPERTENSION 09/27/2006  . OSTEOARTHRITIS 09/27/2006  . MEDIAL MENISCUS TEAR, LEFT 09/27/2006   Current Outpatient Prescriptions on File Prior to Visit   Medication Sig Dispense Refill  . ALPRAZolam (XANAX) 1 MG tablet Take 1 mg by mouth 3 (three) times daily as needed for sleep or anxiety.     Marland Kitchen amLODipine (NORVASC) 10 MG tablet Take 10 mg by mouth every morning.     . citalopram (CELEXA) 40 MG tablet Take 40 mg by mouth every morning.    . cloNIDine (CATAPRES) 0.1 MG tablet Take 0.1 mg by mouth every evening.     . docusate sodium (COLACE) 100 MG capsule Take 100 mg by mouth 2 (two) times daily.    . hydrochlorothiazide (HYDRODIURIL) 25 MG tablet Take 25 mg by mouth daily.    . naftifine (NAFTIN) 1 % cream Apply topically daily. 30 g 0  . naproxen sodium (ALEVE) 220 MG tablet Take 220-440 mg by mouth daily as needed (for pain). Reported on 01/16/2016    . ondansetron (ZOFRAN) 8 MG tablet Take 1 tablet (8 mg total) by mouth every 4 (four) hours as needed. 8 tablet 0  . pravastatin (PRAVACHOL) 20 MG tablet Take 20 mg by mouth every evening.     . promethazine (PHENERGAN) 25 MG tablet Take 25 mg by mouth every 6 (six) hours as needed for nausea or vomiting.    Marland Kitchen tiZANidine (ZANAFLEX) 4 MG tablet Take 1 tablet (4 mg total) by mouth every 6 (six) hours as needed for muscle spasms. 60 tablet 2  . traMADol (ULTRAM) 50 MG tablet Take 1 tablet (50 mg total) by mouth every 6 (six) hours as needed. 15 tablet 0  . trazodone (DESYREL) 300 MG tablet Take 300 mg by mouth at bedtime.    Marland Kitchen  vitamin C (ASCORBIC ACID) 500 MG tablet Take 500 mg by mouth daily.    . [DISCONTINUED] famotidine (PEPCID) 20 MG tablet Take 20 mg by mouth 2 (two) times daily.     Current Facility-Administered Medications on File Prior to Visit  Medication Dose Route Frequency Provider Last Rate Last Dose  . triamcinolone acetonide (KENALOG) 10 MG/ML injection 10 mg  10 mg Other Once [employer], DPM      . triamcinolone acetonide (KENALOG) 10 MG/ML injection 10 mg  10 mg Other Once Landis Martins, DPM       Allergies  Allergen Reactions  . Lisinopril Cough  . Penicillins Itching   . Povidone-Iodine Itching  . Prednisone Nausea And Vomiting  . Betadine [Povidone Iodine] Rash   Objective: General: No acute distress, AAOx3 Cane assisted gait Right foot: Incisions well healed at surgical sites, decreased swelling to right foot, no ecchymosis, no erythema, no warmth, no drainage, no signs of infection noted, there is pain at right lateral ankle and mild swelling, no signs of infection, improved dry scaly skin at interspaces on right, Capillary fill time <3 seconds in all digits, gross sensation present via light touch to right foot. No pain or crepitation with range of motion right foot however there is pain with palpation to dorsal midfoot and lateral ankle on right.  No pain with calf compression.    Assessment and Plan:  Problem List Items Addressed This Visit    None    Visit Diagnoses    Status post right foot surgery    -  Primary    Relevant Medications    HYDROcodone-acetaminophen (NORCO/VICODIN) 5-325 MG tablet    Right foot pain        Relevant Medications    HYDROcodone-acetaminophen (NORCO/VICODIN) 5-325 MG tablet    Ankle pain, right        Relevant Medications    HYDROcodone-acetaminophen (NORCO/VICODIN) 5-325 MG tablet    Swelling of ankle, right          -Patient seen and evaluated -Applied unna boot to right foot and ankle; keep clean, dry, and intact for 5 days as instructed -Advised patient to ice and elevate as necessary  -Cont with Norco as needed for pain; Refilled at today's encounter -Continue with good supportive shoes that do not rub or irritate the dorsum of the right foot  -Patient to return in 4-6 weeks for post op care. In the meantime, patient to call office if any issues or problems arise. May send patient to [facility] for bracing for right ankle pain next visit.   Landis Martins, DPM

## 2016-04-22 ENCOUNTER — Ambulatory Visit: Payer: Medicaid Other | Admitting: Sports Medicine

## 2016-05-02 ENCOUNTER — Other Ambulatory Visit: Payer: Medicaid Other | Admitting: Adult Health

## 2016-05-09 ENCOUNTER — Other Ambulatory Visit: Payer: Medicaid Other | Admitting: Adult Health

## 2016-05-14 ENCOUNTER — Ambulatory Visit: Payer: Medicaid Other | Admitting: Sports Medicine

## 2016-05-27 ENCOUNTER — Encounter: Payer: Self-pay | Admitting: Adult Health

## 2016-05-27 ENCOUNTER — Other Ambulatory Visit (HOSPITAL_COMMUNITY)
Admission: RE | Admit: 2016-05-27 | Discharge: 2016-05-27 | Disposition: A | Discharge status: Home / Self Care | Payer: Medicaid Other | Source: Ambulatory Visit | Attending: Adult Health | Admitting: Adult Health

## 2016-05-27 ENCOUNTER — Ambulatory Visit (INDEPENDENT_AMBULATORY_CARE_PROVIDER_SITE_OTHER): Payer: Medicaid Other | Admitting: Adult Health

## 2016-05-27 VITALS — BP 140/60 | HR 60 | Ht 60.75 in | Wt 242.0 lb

## 2016-05-27 DIAGNOSIS — K5903 Drug induced constipation: Secondary | ICD-10-CM

## 2016-05-27 DIAGNOSIS — Z1211 Encounter for screening for malignant neoplasm of colon: Secondary | ICD-10-CM | POA: Diagnosis not present

## 2016-05-27 DIAGNOSIS — Z0000 Encounter for general adult medical examination without abnormal findings: Secondary | ICD-10-CM

## 2016-05-27 DIAGNOSIS — Z1151 Encounter for screening for human papillomavirus (HPV): Secondary | ICD-10-CM | POA: Diagnosis present

## 2016-05-27 DIAGNOSIS — Z01419 Encounter for gynecological examination (general) (routine) without abnormal findings: Secondary | ICD-10-CM | POA: Insufficient documentation

## 2016-05-27 DIAGNOSIS — Z124 Encounter for screening for malignant neoplasm of cervix: Secondary | ICD-10-CM

## 2016-05-27 DIAGNOSIS — T402X5A Adverse effect of other opioids, initial encounter: Secondary | ICD-10-CM | POA: Insufficient documentation

## 2016-05-27 HISTORY — DX: Drug induced constipation: K59.03

## 2016-05-27 HISTORY — DX: Adverse effect of other opioids, initial encounter: T40.2X5A

## 2016-05-27 LAB — HEMOCCULT GUIAC POC 1CARD (OFFICE): Fecal Occult Blood, POC: NEGATIVE

## 2016-05-27 MED ORDER — LUBIPROSTONE 24 MCG PO CAPS: 24.0000 ug | ORAL_CAPSULE | Freq: Two times a day (BID) | ORAL | 1 refills | Status: DC

## 2016-05-27 NOTE — Patient Instructions (Signed)
Try amitiza  Increase water Physical in 1 year, pap in 3 if normal Mammogram yearly Colonoscopy per [facility] per PCP

## 2016-05-27 NOTE — Progress Notes (Signed)
Patient ID: Rachel [name], female   DOB: 1956/01/22, 60 y.o.   MRN: [id number] History of Present Illness: Rachel [name] is a 60 year old black female in for a well woman gyn exam and pap.She complains of constipation. She has pain meds for her chronic pain conditions, and is using cane today. PCP is Dr Berdine Addison.   Current Medications, Allergies, Past Medical History, Past Surgical History, Family History and Social History were reviewed in [employer] record.     Review of Systems: Patient denies any headaches, hearing loss, fatigue, blurred vision, shortness of breath, chest pain, abdominal pain, problems with urination, or intercourse.Not having sex. No mood swings. Has chronic pain in body and joints.   Physical Exam:BP 140/60 (BP Location: Left Arm, Patient Position: Sitting, Cuff Size: Large)   Pulse 60   Ht 5' 0.75" (1.543 m)   Wt 242 lb (109.8 kg)   BMI 46.10 kg/m  General:  Well developed, well nourished, no acute distress Skin:  Warm and dry Neck:  Midline trachea, normal thyroid, good ROM, no lymphadenopathy,no carotid bruits heard Lungs; Clear to auscultation bilaterally Breast:  No dominant palpable mass, retraction, or nipple discharge Cardiovascular: Regular rate and rhythm Abdomen:  Soft, non tender, no hepatosplenomegaly Pelvic:  External genitalia is normal in appearance, no lesions.  The vagina is normal in appearance. Urethra has no lesions or masses. The cervix is bulbous.  Uterus is felt to be normal size, shape, and contour.  No adnexal masses or tenderness noted.Bladder is non tender, no masses felt. Rectal: Good sphincter tone, no polyps, or hemorrhoids felt.  Hemoccult negative. Extremities/musculoskeletal:  No swelling or varicosities noted, no clubbing or cyanosis Psych:  No mood changes, alert and cooperative,seems happy   Impression: Well woman gyn exam and pap Constipation,porbably OIC  Plan: Rx amitiza 24 mcg #60 take 1 bid with  1 refill Increase water Physical in 1 year, pap in 3 if normal Mammogram yearly Colonoscopy per GI Labs with PCP Let Dr Berdine Addison or me know if wants more refills on amitiza if it works

## 2016-05-28 LAB — CYTOLOGY - PAP

## 2016-06-03 ENCOUNTER — Other Ambulatory Visit (HOSPITAL_COMMUNITY): Payer: Self-pay | Admitting: Family Medicine

## 2016-06-03 DIAGNOSIS — Z1231 Encounter for screening mammogram for malignant neoplasm of breast: Secondary | ICD-10-CM

## 2016-06-11 ENCOUNTER — Encounter: Payer: Self-pay | Admitting: Sports Medicine

## 2016-06-11 ENCOUNTER — Ambulatory Visit (INDEPENDENT_AMBULATORY_CARE_PROVIDER_SITE_OTHER): Payer: Medicaid Other | Admitting: Sports Medicine

## 2016-06-11 DIAGNOSIS — Z9889 Other specified postprocedural states: Secondary | ICD-10-CM

## 2016-06-11 DIAGNOSIS — M216X1 Other acquired deformities of right foot: Secondary | ICD-10-CM | POA: Diagnosis not present

## 2016-06-11 DIAGNOSIS — M779 Enthesopathy, unspecified: Secondary | ICD-10-CM

## 2016-06-11 DIAGNOSIS — M722 Plantar fascial fibromatosis: Secondary | ICD-10-CM

## 2016-06-11 DIAGNOSIS — M79671 Pain in right foot: Secondary | ICD-10-CM

## 2016-06-11 DIAGNOSIS — M25571 Pain in right ankle and joints of right foot: Secondary | ICD-10-CM

## 2016-06-11 MED ORDER — TRIAMCINOLONE ACETONIDE 10 MG/ML IJ SUSP: 10.0000 mg | Freq: Once | INTRAMUSCULAR | Status: DC

## 2016-06-11 NOTE — Progress Notes (Signed)
Patient ID: Rachel [name], female   DOB: September 04, 1956, 60 y.o.   MRN: [id number]  Subjective: Rachel [name] is a 60 y.o. female patient seen today in office for POV #7 (DOS 12-04-15), S/P Partial ostectomy 5th met head and dorsal midfoot, right foot. Patient states that she has no foot pain. Reports that her ankle pain that is continuing to bother her and sometimes feels like her ankle is going to roll, admits only minimal relief from unna boot, denies calf pain, denies headache, chest pain, shortness of breath, nausea, vomiting, fever, or chills. No other issues noted.   Patient Active Problem List   Diagnosis Date Noted  . Constipation due to opioid therapy 05/27/2016  . Obesity 05/22/2015  . Mild protein-calorie malnutrition ([place]) 12/14/2013  . Dehydration 12/12/2013  . Colitis, acute 12/12/2013  . Hypokalemia 12/12/2013  . C. difficile colitis 12/11/2013  . Abdominal pain 11/07/2013  . Colitis 11/07/2013  . Acute colitis 11/07/2013  . Arthralgia of hip 09/30/2013  . Degenerative arthritis of hip 09/30/2013  . Arthritis of knee, right 09/02/2013  . Osteoarthritis of left knee 04/13/2013  . OA (osteoarthritis) of knee 01/16/2012  . Acquired trigger finger 11/21/2011  . ARTHRITIS, LEFT KNEE 07/12/2010  . NECK PAIN 05/31/2010  . JOINT EFFUSION, LEFT KNEE 04/05/2010  . RUPTURE ROTATOR CUFF 05/03/2009  . THYROID STIMULATING HORMONE, ABNORMAL 04/19/2009  . IMPINGEMENT SYNDROME 03/29/2009  . NUMBNESS, ARM 03/29/2009  . SKIN RASH 03/08/2009  . HIP, ARTHRITIS, DEGEN./OSTEO 09/26/2008  . ALLERGIC RHINITIS 08/11/2008  . HIP PAIN, RIGHT 07/19/2008  . SYMPTOMATIC MENOPAUSAL/FEMALE CLIMACTERIC STATES 03/17/2008  . SHOULDER PAIN 01/25/2008  . HYPERLIPIDEMIA 09/27/2006  . OBESITY 09/27/2006  . ANXIETY 09/27/2006  . DEPRESSION 09/27/2006  . HYPERTENSION 09/27/2006  . OSTEOARTHRITIS 09/27/2006  . MEDIAL MENISCUS TEAR, LEFT 09/27/2006   Current Outpatient Prescriptions on File  Prior to Visit  Medication Sig Dispense Refill  . ALPRAZolam (XANAX) 1 MG tablet Take 1 mg by mouth 3 (three) times daily as needed for sleep or anxiety.     Marland Kitchen amLODipine (NORVASC) 10 MG tablet Take 10 mg by mouth every morning.     . citalopram (CELEXA) 40 MG tablet Take 40 mg by mouth every morning.    . cloNIDine (CATAPRES) 0.1 MG tablet Take 0.1 mg by mouth every evening.     . docusate sodium (COLACE) 100 MG capsule Take 100 mg by mouth 2 (two) times daily.    Marland Kitchen HYDROcodone-acetaminophen (NORCO) 10-325 MG tablet Take 1 tablet by mouth every 6 (six) hours as needed.    . lubiprostone (AMITIZA) 24 MCG capsule Take 1 capsule (24 mcg total) by mouth 2 (two) times daily with a meal. 60 capsule 1  . ondansetron (ZOFRAN) 8 MG tablet Take 1 tablet (8 mg total) by mouth every 4 (four) hours as needed. 8 tablet 0  . pravastatin (PRAVACHOL) 20 MG tablet Take 20 mg by mouth every evening.     . promethazine (PHENERGAN) 25 MG tablet Take 25 mg by mouth every 6 (six) hours as needed for nausea or vomiting.    Marland Kitchen tiZANidine (ZANAFLEX) 4 MG tablet Take 1 tablet (4 mg total) by mouth every 6 (six) hours as needed for muscle spasms. 60 tablet 2  . traMADol (ULTRAM) 50 MG tablet Take 1 tablet (50 mg total) by mouth every 6 (six) hours as needed. 15 tablet 0  . trazodone (DESYREL) 300 MG tablet Take 300 mg by mouth at bedtime.    Marland Kitchen  vitamin C (ASCORBIC ACID) 500 MG tablet Take 500 mg by mouth daily.    . [DISCONTINUED] famotidine (PEPCID) 20 MG tablet Take 20 mg by mouth 2 (two) times daily.     Current Facility-Administered Medications on File Prior to Visit  Medication Dose Route Frequency Provider Last Rate Last Dose  . triamcinolone acetonide (KENALOG) 10 MG/ML injection 10 mg  10 mg Other Once [employer], DPM      . triamcinolone acetonide (KENALOG) 10 MG/ML injection 10 mg  10 mg Other Once Landis Martins, DPM       Allergies  Allergen Reactions  . Lisinopril Cough  . Penicillins Itching  .  Povidone-Iodine Itching  . Prednisone Nausea And Vomiting  . Betadine [Povidone Iodine] Rash   Objective: General: No acute distress, AAOx3 Cane assisted gait Right foot: Incisions well healed at surgical sites, decreased swelling to right foot, no ecchymosis, no erythema, no warmth, no drainage, no signs of infection noted, there is decreased pain at right lateral ankle, + increased new pain at posterior tibial tendon on right with equinus foot type, no signs of infection, improved dry scaly skin at interspaces on right, Capillary fill time <3 seconds in all digits, gross sensation present via light touch to right foot. No pain or crepitation with range of motion right foot however there is pain with palpation to medial>lateral ankle on right.  No pain with calf compression.   Assessment and Plan:  Problem List Items Addressed This Visit    None    Visit Diagnoses    Status post right foot surgery    -  Primary   well healed   Right foot pain       resolved   Ankle pain, right       Relevant Medications   triamcinolone acetonide (KENALOG) 10 MG/ML injection 10 mg   Tendonitis       Relevant Medications   triamcinolone acetonide (KENALOG) 10 MG/ML injection 10 mg   Acquired equinus deformity of right foot         -Patient seen and evaluated -After oral consent and aseptic prep, injected a mixture containing 1 ml of 2%  plain lidocaine, 1 ml 0.5% plain marcaine, 0.5 ml of kenalog 10 and 0.5 ml of dexamethasone phosphate into to medial ankle at Posterior tibial tendon without complication. Post-injection care discussed with patient. -Recommend icing as instructed  -Continue with good supportive shoes  -Rx [facility] for AFO -Patient to return after AFO is received for follow up eval. In the meantime, patient to call office if any issues or problems arise.  Landis Martins, DPM

## 2016-06-12 ENCOUNTER — Ambulatory Visit (HOSPITAL_COMMUNITY)
Admission: RE | Admit: 2016-06-12 | Discharge: 2016-06-12 | Disposition: A | Discharge status: Home / Self Care | Payer: Medicaid Other | Source: Ambulatory Visit | Attending: Family Medicine | Admitting: Family Medicine

## 2016-06-12 DIAGNOSIS — Z1231 Encounter for screening mammogram for malignant neoplasm of breast: Secondary | ICD-10-CM | POA: Insufficient documentation

## 2016-07-08 ENCOUNTER — Ambulatory Visit (INDEPENDENT_AMBULATORY_CARE_PROVIDER_SITE_OTHER): Payer: Medicaid Other | Admitting: Orthopedic Surgery

## 2016-07-08 ENCOUNTER — Encounter: Payer: Self-pay | Admitting: Orthopedic Surgery

## 2016-07-08 VITALS — BP 115/48 | HR 86 | Ht 61.5 in | Wt 239.0 lb

## 2016-07-08 DIAGNOSIS — M1732 Unilateral post-traumatic osteoarthritis, left knee: Secondary | ICD-10-CM | POA: Diagnosis not present

## 2016-07-08 DIAGNOSIS — M1711 Unilateral primary osteoarthritis, right knee: Secondary | ICD-10-CM

## 2016-07-08 NOTE — Progress Notes (Signed)
Chief Complaint  Patient presents with  . Follow-up    BILATERAL KNEE PAIN, REQUESTS INJECTIONS     Procedure note left knee injection verbal consent was obtained to inject left knee joint  Timeout was completed to confirm the site of injection  The medications used were 40 mg of Depo-Medrol and 1% lidocaine 3 cc  Anesthesia was provided by ethyl chloride and the skin was prepped with alcohol.  After cleaning the skin with alcohol a 20-gauge needle was used to inject the left knee joint. There were no complications. A sterile bandage was applied.   Procedure note right knee injection verbal consent was obtained to inject right knee joint  Timeout was completed to confirm the site of injection  The medications used were 40 mg of Depo-Medrol and 1% lidocaine 3 cc  Anesthesia was provided by ethyl chloride and the skin was prepped with alcohol.  After cleaning the skin with alcohol a 20-gauge needle was used to inject the right knee joint. There were no complications. A sterile bandage was applied.   

## 2016-07-08 NOTE — Patient Instructions (Signed)

## 2016-07-16 ENCOUNTER — Ambulatory Visit (INDEPENDENT_AMBULATORY_CARE_PROVIDER_SITE_OTHER): Payer: Medicaid Other | Admitting: Sports Medicine

## 2016-07-16 ENCOUNTER — Encounter: Payer: Self-pay | Admitting: Sports Medicine

## 2016-07-16 DIAGNOSIS — M722 Plantar fascial fibromatosis: Secondary | ICD-10-CM | POA: Diagnosis not present

## 2016-07-16 DIAGNOSIS — M25571 Pain in right ankle and joints of right foot: Secondary | ICD-10-CM

## 2016-07-16 DIAGNOSIS — M779 Enthesopathy, unspecified: Secondary | ICD-10-CM

## 2016-07-16 MED ORDER — TRIAMCINOLONE ACETONIDE 40 MG/ML IJ SUSP
20.0000 mg | Freq: Once | INTRAMUSCULAR | Status: DC
Start: 2016-07-16 — End: 2017-04-25

## 2016-07-16 NOTE — Progress Notes (Signed)
Patient ID: ANAUM SGROI, female   DOB: [birth year], 60 y.o.   MRN: [id number]  Subjective: Rachel [name] is a 60 y.o. female patient seen today in office for follow up evaluation of her right ankle pain. Reports that she still has pain with walking at the right ankle. States the injection helped but still has some pain. Has been wearing brace since 06-26-16 from [facility] but is not sure if she's lacing it correctly. Patient denies any other pedal complaints.    Patient Active Problem List   Diagnosis Date Noted  . Constipation due to opioid therapy 05/27/2016  . Obesity 05/22/2015  . Mild protein-calorie malnutrition ([place]) 12/14/2013  . Dehydration 12/12/2013  . Colitis, acute 12/12/2013  . Hypokalemia 12/12/2013  . C. difficile colitis 12/11/2013  . Abdominal pain 11/07/2013  . Colitis 11/07/2013  . Acute colitis 11/07/2013  . Arthralgia of hip 09/30/2013  . Degenerative arthritis of hip 09/30/2013  . Arthritis of knee, right 09/02/2013  . Osteoarthritis of left knee 04/13/2013  . OA (osteoarthritis) of knee 01/16/2012  . Acquired trigger finger 11/21/2011  . ARTHRITIS, LEFT KNEE 07/12/2010  . NECK PAIN 05/31/2010  . JOINT EFFUSION, LEFT KNEE 04/05/2010  . RUPTURE ROTATOR CUFF 05/03/2009  . THYROID STIMULATING HORMONE, ABNORMAL 04/19/2009  . IMPINGEMENT SYNDROME 03/29/2009  . NUMBNESS, ARM 03/29/2009  . SKIN RASH 03/08/2009  . HIP, ARTHRITIS, DEGEN./OSTEO 09/26/2008  . ALLERGIC RHINITIS 08/11/2008  . HIP PAIN, RIGHT 07/19/2008  . SYMPTOMATIC MENOPAUSAL/FEMALE CLIMACTERIC STATES 03/17/2008  . SHOULDER PAIN 01/25/2008  . HYPERLIPIDEMIA 09/27/2006  . OBESITY 09/27/2006  . ANXIETY 09/27/2006  . DEPRESSION 09/27/2006  . HYPERTENSION 09/27/2006  . OSTEOARTHRITIS 09/27/2006  . MEDIAL MENISCUS TEAR, LEFT 09/27/2006   Current Outpatient Prescriptions on File Prior to Visit  Medication Sig Dispense Refill  . ALPRAZolam (XANAX) 1 MG tablet Take 1 mg by mouth 3  (three) times daily as needed for sleep or anxiety.     Marland Kitchen amLODipine (NORVASC) 10 MG tablet Take 10 mg by mouth every morning.     . citalopram (CELEXA) 40 MG tablet Take 40 mg by mouth every morning.    . cloNIDine (CATAPRES) 0.1 MG tablet Take 0.1 mg by mouth every evening.     . docusate sodium (COLACE) 100 MG capsule Take 100 mg by mouth 2 (two) times daily.    Marland Kitchen HYDROcodone-acetaminophen (NORCO) 10-325 MG tablet Take 1 tablet by mouth every 6 (six) hours as needed.    . lubiprostone (AMITIZA) 24 MCG capsule Take 1 capsule (24 mcg total) by mouth 2 (two) times daily with a meal. 60 capsule 1  . ondansetron (ZOFRAN) 8 MG tablet Take 1 tablet (8 mg total) by mouth every 4 (four) hours as needed. 8 tablet 0  . pravastatin (PRAVACHOL) 20 MG tablet Take 20 mg by mouth every evening.     . promethazine (PHENERGAN) 25 MG tablet Take 25 mg by mouth every 6 (six) hours as needed for nausea or vomiting.    Marland Kitchen tiZANidine (ZANAFLEX) 4 MG tablet Take 1 tablet (4 mg total) by mouth every 6 (six) hours as needed for muscle spasms. 60 tablet 2  . traMADol (ULTRAM) 50 MG tablet Take 1 tablet (50 mg total) by mouth every 6 (six) hours as needed. 15 tablet 0  . trazodone (DESYREL) 300 MG tablet Take 300 mg by mouth at bedtime.    . vitamin C (ASCORBIC ACID) 500 MG tablet Take 500 mg by mouth daily.    . [  DISCONTINUED] famotidine (PEPCID) 20 MG tablet Take 20 mg by mouth 2 (two) times daily.     Current Facility-Administered Medications on File Prior to Visit  Medication Dose Route Frequency Provider Last Rate Last Dose  . triamcinolone acetonide (KENALOG) 10 MG/ML injection 10 mg  10 mg Other Once [employer], DPM      . triamcinolone acetonide (KENALOG) 10 MG/ML injection 10 mg  10 mg Other Once [employer], DPM      . triamcinolone acetonide (KENALOG) 10 MG/ML injection 10 mg  10 mg Other Once Landis Martins, DPM       Allergies  Allergen Reactions  . Lisinopril Cough  . Penicillins Itching  .  Povidone-Iodine Itching  . Prednisone Nausea And Vomiting  . Betadine [Povidone Iodine] Rash   Objective: General: No acute distress, AAOx3 Cane assisted gait Right foot: Incisions well healed at surgical sites, no ecchymosis, no erythema, no warmth, no drainage, no signs of infection noted, there is decreased pain at right lateral ankle, + pain at posterior tibial tendon on right with equinus foot type, no signs of infection, improved dry scaly skin at interspaces on right however there is scaly skin at lateral foot on right suggestive of tinea, Capillary fill time <3 seconds in all digits, gross sensation present via light touch to right foot. No pain or crepitation with range of motion right foot however there is pain with palpation to medial>lateral ankle on right at PT tendon.  No pain with calf compression.   Assessment and Plan:  Problem List Items Addressed This Visit    None    Visit Diagnoses    Ankle pain, right    -  Primary   Relevant Medications   triamcinolone acetonide (KENALOG-40) injection 20 mg (Start on 07/16/2016  9:15 AM)   Tendonitis       Relevant Medications   triamcinolone acetonide (KENALOG-40) injection 20 mg (Start on 07/16/2016  9:15 AM)     -Patient seen and evaluated -After oral consent and aseptic prep, injected a mixture containing 1 ml of 2% plain lidocaine, 1 ml 0.5% plain marcaine, 0.5 ml of kenalog 40 and 0.5 ml of dexamethasone phosphate into to medial ankle at Posterior tibial tendon on right without complication. Post-injection care discussed with patient. -Recommend icing as instructed  -Continue with good supportive shoes  -Continue with lace up brace/ AFO -Patient to return as needed for follow up eval. In the meantime, patient to call office if any issues or problems arise. May consider MRI if no improvement.   Landis Martins, DPM

## 2016-07-16 NOTE — Patient Instructions (Signed)
Lamisil AT cream

## 2016-10-07 ENCOUNTER — Ambulatory Visit (INDEPENDENT_AMBULATORY_CARE_PROVIDER_SITE_OTHER): Payer: Medicaid Other | Admitting: Orthopedic Surgery

## 2016-10-07 ENCOUNTER — Encounter: Payer: Self-pay | Admitting: Orthopedic Surgery

## 2016-10-07 DIAGNOSIS — M1711 Unilateral primary osteoarthritis, right knee: Secondary | ICD-10-CM

## 2016-10-07 DIAGNOSIS — M62838 Other muscle spasm: Secondary | ICD-10-CM

## 2016-10-07 DIAGNOSIS — M1732 Unilateral post-traumatic osteoarthritis, left knee: Secondary | ICD-10-CM

## 2016-10-07 MED ORDER — TIZANIDINE HCL 4 MG PO TABS: 4.0000 mg | ORAL_TABLET | Freq: Four times a day (QID) | ORAL | 2 refills | Status: DC | PRN

## 2016-10-07 NOTE — Progress Notes (Signed)
  Procedure note left knee injection verbal consent was obtained to inject left knee joint  Timeout was completed to confirm the site of injection  The medications used were 40 mg of Depo-Medrol and 1% lidocaine 3 cc  Anesthesia was provided by ethyl chloride and the skin was prepped with alcohol.  After cleaning the skin with alcohol a 20-gauge needle was used to inject the left knee joint. There were no complications. A sterile bandage was applied.  Procedure note right knee injection verbal consent was obtained to inject right knee joint  Timeout was completed to confirm the site of injection  The medications used were 40 mg of Depo-Medrol and 1% lidocaine 3 cc  Anesthesia was provided by ethyl chloride and the skin was prepped with alcohol.  After cleaning the skin with alcohol a 20-gauge needle was used to inject the right knee joint. There were no complications. A sterile bandage was applied.  

## 2016-10-30 ENCOUNTER — Telehealth: Payer: Self-pay | Admitting: Orthopedic Surgery

## 2016-10-30 NOTE — Telephone Encounter (Signed)
Patient called and requested a refill on Prednisone be called into [place].

## 2016-10-30 NOTE — Telephone Encounter (Signed)
ROUTING TO DR HARRISON FOR APPROVAL 

## 2016-11-01 NOTE — Telephone Encounter (Signed)
Upon further review of chart, Dr Aline Brochure has never prescribed Prednisone for this patient

## 2016-11-01 NOTE — Telephone Encounter (Signed)
ok 

## 2017-01-06 ENCOUNTER — Ambulatory Visit: Payer: Medicaid Other | Admitting: Orthopedic Surgery

## 2017-01-11 ENCOUNTER — Emergency Department (HOSPITAL_COMMUNITY)
Admission: EM | Admit: 2017-01-11 | Discharge: 2017-01-11 | Disposition: A | Discharge status: Home / Self Care | Payer: Medicaid Other | Attending: Emergency Medicine | Admitting: Emergency Medicine

## 2017-01-11 ENCOUNTER — Encounter (HOSPITAL_COMMUNITY): Payer: Self-pay | Admitting: *Deleted

## 2017-01-11 ENCOUNTER — Emergency Department (HOSPITAL_COMMUNITY): Payer: Medicaid Other

## 2017-01-11 DIAGNOSIS — R350 Frequency of micturition: Secondary | ICD-10-CM | POA: Insufficient documentation

## 2017-01-11 DIAGNOSIS — R1011 Right upper quadrant pain: Secondary | ICD-10-CM | POA: Diagnosis present

## 2017-01-11 DIAGNOSIS — I10 Essential (primary) hypertension: Secondary | ICD-10-CM | POA: Diagnosis not present

## 2017-01-11 DIAGNOSIS — Z79899 Other long term (current) drug therapy: Secondary | ICD-10-CM | POA: Insufficient documentation

## 2017-01-11 DIAGNOSIS — R109 Unspecified abdominal pain: Secondary | ICD-10-CM

## 2017-01-11 DIAGNOSIS — K5900 Constipation, unspecified: Secondary | ICD-10-CM | POA: Diagnosis not present

## 2017-01-11 DIAGNOSIS — M791 Myalgia: Secondary | ICD-10-CM | POA: Insufficient documentation

## 2017-01-11 DIAGNOSIS — R630 Anorexia: Secondary | ICD-10-CM | POA: Diagnosis not present

## 2017-01-11 LAB — CBC WITH DIFFERENTIAL/PLATELET
Basophils Absolute: 0 10*3/uL (ref 0.0–0.1)
Basophils Relative: 0 %
Eosinophils Absolute: 0.3 10*3/uL (ref 0.0–0.7)
Eosinophils Relative: 4 %
HCT: 36 % (ref 36.0–46.0)
HEMOGLOBIN: 12.5 g/dL (ref 12.0–15.0)
Lymphocytes Relative: 32 %
Lymphs Abs: 2.7 10*3/uL (ref 0.7–4.0)
MCH: 31.1 pg (ref 26.0–34.0)
MCHC: 34.7 g/dL (ref 30.0–36.0)
MCV: 89.6 fL (ref 78.0–100.0)
MONOS PCT: 11 %
Monocytes Absolute: 0.9 10*3/uL (ref 0.1–1.0)
NEUTROS PCT: 53 %
Neutro Abs: 4.4 10*3/uL (ref 1.7–7.7)
Platelets: 256 10*3/uL (ref 150–400)
RBC: 4.02 MIL/uL (ref 3.87–5.11)
RDW: 12.8 % (ref 11.5–15.5)
WBC: 8.3 10*3/uL (ref 4.0–10.5)

## 2017-01-11 LAB — LIPASE, BLOOD: Lipase: 41 U/L (ref 11–51)

## 2017-01-11 LAB — COMPREHENSIVE METABOLIC PANEL
ALK PHOS: 60 U/L (ref 38–126)
ALT: 12 U/L — ABNORMAL LOW (ref 14–54)
ANION GAP: 9 (ref 5–15)
AST: 21 U/L (ref 15–41)
Albumin: 3.6 g/dL (ref 3.5–5.0)
BUN: 23 mg/dL — ABNORMAL HIGH (ref 6–20)
CALCIUM: 9.1 mg/dL (ref 8.9–10.3)
CO2: 23 mmol/L (ref 22–32)
Chloride: 103 mmol/L (ref 101–111)
Creatinine, Ser: 1.13 mg/dL — ABNORMAL HIGH (ref 0.44–1.00)
GFR calc Af Amer: 59 mL/min — ABNORMAL LOW (ref >60)
GFR, EST NON AFRICAN AMERICAN: 51 mL/min — AB (ref >60)
GLUCOSE: 110 mg/dL — AB (ref 65–99)
Potassium: 3.9 mmol/L (ref 3.5–5.1)
Sodium: 135 mmol/L (ref 135–145)
TOTAL PROTEIN: 7 g/dL (ref 6.5–8.1)
Total Bilirubin: 0.4 mg/dL (ref 0.3–1.2)

## 2017-01-11 LAB — URINALYSIS, ROUTINE W REFLEX MICROSCOPIC
BILIRUBIN URINE: NEGATIVE
Glucose, UA: NEGATIVE mg/dL
Hgb urine dipstick: NEGATIVE
Ketones, ur: 5 mg/dL — AB
NITRITE: NEGATIVE
PH: 5 (ref 5.0–8.0)
Protein, ur: 30 mg/dL — AB
SPECIFIC GRAVITY, URINE: 1.028 (ref 1.005–1.030)

## 2017-01-11 MED ORDER — HYDROMORPHONE HCL 1 MG/ML IJ SOLN
1.0000 mg | Freq: Once | INTRAMUSCULAR | Status: AC
  Administered 2017-01-11: 1 mg via INTRAVENOUS
  Filled 2017-01-11: qty 1

## 2017-01-11 MED ORDER — KETOROLAC TROMETHAMINE 30 MG/ML IJ SOLN
30.0000 mg | Freq: Once | INTRAMUSCULAR | Status: AC
  Administered 2017-01-11: 30 mg via INTRAVENOUS
  Filled 2017-01-11: qty 1

## 2017-01-11 MED ORDER — METHOCARBAMOL 500 MG PO TABS: 1000.0000 mg | ORAL_TABLET | Freq: Three times a day (TID) | ORAL | 0 refills | Status: DC | PRN

## 2017-01-11 MED ORDER — TRAMADOL HCL 50 MG PO TABS
50.0000 mg | ORAL_TABLET | Freq: Four times a day (QID) | ORAL | 0 refills | Status: DC | PRN
Start: 2017-01-11 — End: 2018-01-20

## 2017-01-11 MED ORDER — CIPROFLOXACIN HCL 250 MG PO TABS: 250.0000 mg | ORAL_TABLET | Freq: Two times a day (BID) | ORAL | 0 refills | Status: DC

## 2017-01-11 NOTE — ED Triage Notes (Signed)
Pt states that she woke up with right hip pain this am, denies any injury, has hx of right hip replacement in 2010, positive pedal pulse noted,
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
## 2017-01-14 LAB — URINE CULTURE

## 2017-01-15 ENCOUNTER — Telehealth: Payer: Self-pay | Admitting: Emergency Medicine

## 2017-01-15 NOTE — Telephone Encounter (Signed)
Post ED Visit - Positive Culture Follow-up  Culture report reviewed by antimicrobial stewardship pharmacist:  []  Elenor Quinones, Pharm.D. []  Heide Guile, Pharm.D., BCPS AQ-ID []  Parks Neptune, Pharm.D., BCPS []  Alycia Rossetti, Pharm.D., BCPS []  Seibert, Pharm.D., BCPS, AAHIVP []  Legrand Como, Pharm.D., BCPS, AAHIVP []  Salome Arnt, PharmD, BCPS []  Dimitri Ped, PharmD, BCPS []  Vincenza Hews, PharmD, BCPS  Positive urine culture Treated with cirpfloxacin, organism sensitive to the same and no further patient follow-up is required at this time.  Hazle Nordmann 01/15/2017, 12:21 PM

## 2017-01-28 ENCOUNTER — Encounter: Payer: Self-pay | Admitting: Orthopedic Surgery

## 2017-01-28 ENCOUNTER — Ambulatory Visit (INDEPENDENT_AMBULATORY_CARE_PROVIDER_SITE_OTHER): Payer: Medicaid Other | Admitting: Orthopedic Surgery

## 2017-01-28 DIAGNOSIS — M1711 Unilateral primary osteoarthritis, right knee: Secondary | ICD-10-CM

## 2017-01-28 DIAGNOSIS — M1732 Unilateral post-traumatic osteoarthritis, left knee: Secondary | ICD-10-CM | POA: Diagnosis not present

## 2017-01-28 MED ORDER — PREDNISONE 10 MG (48) PO TBPK: ORAL_TABLET | Freq: Every day | ORAL | 0 refills | Status: DC

## 2017-01-28 NOTE — Progress Notes (Signed)
Chief Complaint  Patient presents with  . Follow-up    bilateral knee injections     Procedure note left knee injection verbal consent was obtained to inject left knee joint  Timeout was completed to confirm the site of injection  The medications used were 40 mg of Depo-Medrol and 1% lidocaine 3 cc  Anesthesia was provided by ethyl chloride and the skin was prepped with alcohol.  After cleaning the skin with alcohol a 20-gauge needle was used to inject the left knee joint. There were no complications. A sterile bandage was applied.   Procedure note right knee injection verbal consent was obtained to inject right knee joint  Timeout was completed to confirm the site of injection  The medications used were 40 mg of Depo-Medrol and 1% lidocaine 3 cc  Anesthesia was provided by ethyl chloride and the skin was prepped with alcohol.  After cleaning the skin with alcohol a 20-gauge needle was used to inject the right knee joint. There were no complications. A sterile bandage was applied.

## 2017-04-23 ENCOUNTER — Emergency Department (HOSPITAL_COMMUNITY): Payer: Medicaid Other

## 2017-04-23 ENCOUNTER — Inpatient Hospital Stay (HOSPITAL_COMMUNITY)
Admission: EM | Admit: 2017-04-23 | Discharge: 2017-04-25 | DRG: 203 | Disposition: A | Discharge status: Home / Self Care | Payer: Medicaid Other | Attending: Internal Medicine | Admitting: Internal Medicine

## 2017-04-23 ENCOUNTER — Encounter (HOSPITAL_COMMUNITY): Payer: Self-pay | Admitting: Emergency Medicine

## 2017-04-23 DIAGNOSIS — R0602 Shortness of breath: Secondary | ICD-10-CM

## 2017-04-23 DIAGNOSIS — I10 Essential (primary) hypertension: Secondary | ICD-10-CM | POA: Diagnosis present

## 2017-04-23 DIAGNOSIS — E785 Hyperlipidemia, unspecified: Secondary | ICD-10-CM | POA: Diagnosis present

## 2017-04-23 DIAGNOSIS — Z880 Allergy status to penicillin: Secondary | ICD-10-CM

## 2017-04-23 DIAGNOSIS — M1712 Unilateral primary osteoarthritis, left knee: Secondary | ICD-10-CM | POA: Diagnosis present

## 2017-04-23 DIAGNOSIS — J209 Acute bronchitis, unspecified: Principal | ICD-10-CM | POA: Diagnosis present

## 2017-04-23 DIAGNOSIS — E876 Hypokalemia: Secondary | ICD-10-CM

## 2017-04-23 DIAGNOSIS — Z888 Allergy status to other drugs, medicaments and biological substances status: Secondary | ICD-10-CM

## 2017-04-23 DIAGNOSIS — Z79899 Other long term (current) drug therapy: Secondary | ICD-10-CM

## 2017-04-23 DIAGNOSIS — J40 Bronchitis, not specified as acute or chronic: Secondary | ICD-10-CM

## 2017-04-23 LAB — COMPREHENSIVE METABOLIC PANEL
ALT: 17 U/L (ref 14–54)
AST: 23 U/L (ref 15–41)
Albumin: 4.1 g/dL (ref 3.5–5.0)
Alkaline Phosphatase: 61 U/L (ref 38–126)
Anion gap: 13 (ref 5–15)
BUN: 20 mg/dL (ref 6–20)
CHLORIDE: 103 mmol/L (ref 101–111)
CO2: 22 mmol/L (ref 22–32)
CREATININE: 1 mg/dL (ref 0.44–1.00)
Calcium: 9.7 mg/dL (ref 8.9–10.3)
GFR, EST NON AFRICAN AMERICAN: 59 mL/min — AB (ref >60)
Glucose, Bld: 123 mg/dL — ABNORMAL HIGH (ref 65–99)
POTASSIUM: 2.7 mmol/L — AB (ref 3.5–5.1)
SODIUM: 138 mmol/L (ref 135–145)
Total Bilirubin: 0.6 mg/dL (ref 0.3–1.2)
Total Protein: 7.8 g/dL (ref 6.5–8.1)

## 2017-04-23 LAB — CBC WITH DIFFERENTIAL/PLATELET
Basophils Absolute: 0 10*3/uL (ref 0.0–0.1)
Basophils Relative: 0 %
EOS ABS: 0.2 10*3/uL (ref 0.0–0.7)
Eosinophils Relative: 2 %
HCT: 39.6 % (ref 36.0–46.0)
HEMOGLOBIN: 13.7 g/dL (ref 12.0–15.0)
LYMPHS ABS: 3.2 10*3/uL (ref 0.7–4.0)
Lymphocytes Relative: 32 %
MCH: 31.1 pg (ref 26.0–34.0)
MCHC: 34.6 g/dL (ref 30.0–36.0)
MCV: 89.8 fL (ref 78.0–100.0)
MONOS PCT: 7 %
Monocytes Absolute: 0.8 10*3/uL (ref 0.1–1.0)
NEUTROS PCT: 59 %
Neutro Abs: 5.9 10*3/uL (ref 1.7–7.7)
Platelets: 288 10*3/uL (ref 150–400)
RBC: 4.41 MIL/uL (ref 3.87–5.11)
RDW: 13.1 % (ref 11.5–15.5)
WBC: 10.1 10*3/uL (ref 4.0–10.5)

## 2017-04-23 LAB — D-DIMER, QUANTITATIVE: D-Dimer, Quant: 3.22 ug/mL-FEU — ABNORMAL HIGH (ref 0.00–0.50)

## 2017-04-23 MED ORDER — POTASSIUM CHLORIDE 10 MEQ/100ML IV SOLN
10.0000 meq | Freq: Once | INTRAVENOUS | Status: AC
  Administered 2017-04-24: 10 meq via INTRAVENOUS
  Filled 2017-04-23: qty 100

## 2017-04-23 MED ORDER — METHYLPREDNISOLONE SODIUM SUCC 125 MG IJ SOLR
125.0000 mg | Freq: Once | INTRAMUSCULAR | Status: AC
  Administered 2017-04-23: 125 mg via INTRAVENOUS
  Filled 2017-04-23: qty 2

## 2017-04-23 MED ORDER — SODIUM CHLORIDE 0.9 % IV SOLN
INTRAVENOUS | Status: DC
  Administered 2017-04-23: 22:00:00 via INTRAVENOUS

## 2017-04-23 MED ORDER — POTASSIUM CHLORIDE 10 MEQ/100ML IV SOLN
10.0000 meq | Freq: Once | INTRAVENOUS | Status: AC
  Administered 2017-04-23: 10 meq via INTRAVENOUS
  Filled 2017-04-23: qty 100

## 2017-04-23 MED ORDER — ALBUTEROL SULFATE (2.5 MG/3ML) 0.083% IN NEBU
5.0000 mg | INHALATION_SOLUTION | Freq: Once | RESPIRATORY_TRACT | Status: AC
  Administered 2017-04-23: 5 mg via RESPIRATORY_TRACT
  Filled 2017-04-23: qty 6

## 2017-04-23 MED ORDER — SODIUM CHLORIDE 0.9 % IV BOLUS (SEPSIS)
1000.0000 mL | Freq: Once | INTRAVENOUS | Status: AC
  Administered 2017-04-23: 1000 mL via INTRAVENOUS

## 2017-04-23 MED ORDER — IOPAMIDOL (ISOVUE-370) INJECTION 76%
100.0000 mL | Freq: Once | INTRAVENOUS | Status: AC | PRN
  Administered 2017-04-23: 100 mL via INTRAVENOUS

## 2017-04-23 MED ORDER — ONDANSETRON HCL 4 MG/2ML IJ SOLN
4.0000 mg | Freq: Once | INTRAMUSCULAR | Status: AC
  Administered 2017-04-23: 4 mg via INTRAVENOUS
  Filled 2017-04-23: qty 2

## 2017-04-23 MED ORDER — IPRATROPIUM-ALBUTEROL 0.5-2.5 (3) MG/3ML IN SOLN
3.0000 mL | Freq: Once | RESPIRATORY_TRACT | Status: AC
  Administered 2017-04-23: 3 mL via RESPIRATORY_TRACT
  Filled 2017-04-23: qty 3

## 2017-04-23 NOTE — ED Notes (Signed)
Pt says shortness of breath increased starting last night, noticed hives this morning.
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
## 2017-04-23 NOTE — ED Triage Notes (Signed)
Pt c/o sob for the past three week, worse over the past two days

## 2017-04-23 NOTE — ED Notes (Signed)
ED Provider at bedside. 

## 2017-04-23 NOTE — ED Notes (Signed)
Patient transported to X-ray 

## 2017-04-23 NOTE — ED Notes (Signed)
Patient transported to CT 

## 2017-04-24 ENCOUNTER — Observation Stay (HOSPITAL_BASED_OUTPATIENT_CLINIC_OR_DEPARTMENT_OTHER): Payer: Medicaid Other

## 2017-04-24 DIAGNOSIS — E876 Hypokalemia: Secondary | ICD-10-CM

## 2017-04-24 DIAGNOSIS — M1712 Unilateral primary osteoarthritis, left knee: Secondary | ICD-10-CM | POA: Diagnosis present

## 2017-04-24 DIAGNOSIS — J9601 Acute respiratory failure with hypoxia: Secondary | ICD-10-CM

## 2017-04-24 DIAGNOSIS — R0602 Shortness of breath: Secondary | ICD-10-CM

## 2017-04-24 DIAGNOSIS — Z888 Allergy status to other drugs, medicaments and biological substances status: Secondary | ICD-10-CM | POA: Diagnosis not present

## 2017-04-24 DIAGNOSIS — J209 Acute bronchitis, unspecified: Secondary | ICD-10-CM | POA: Diagnosis present

## 2017-04-24 DIAGNOSIS — Z880 Allergy status to penicillin: Secondary | ICD-10-CM | POA: Diagnosis not present

## 2017-04-24 DIAGNOSIS — Z79899 Other long term (current) drug therapy: Secondary | ICD-10-CM | POA: Diagnosis not present

## 2017-04-24 DIAGNOSIS — I10 Essential (primary) hypertension: Secondary | ICD-10-CM | POA: Diagnosis present

## 2017-04-24 DIAGNOSIS — E785 Hyperlipidemia, unspecified: Secondary | ICD-10-CM | POA: Diagnosis present

## 2017-04-24 LAB — CBC
HCT: 35.4 % — ABNORMAL LOW (ref 36.0–46.0)
HEMOGLOBIN: 12 g/dL (ref 12.0–15.0)
MCH: 30.5 pg (ref 26.0–34.0)
MCHC: 33.9 g/dL (ref 30.0–36.0)
MCV: 90.1 fL (ref 78.0–100.0)
PLATELETS: 267 10*3/uL (ref 150–400)
RBC: 3.93 MIL/uL (ref 3.87–5.11)
RDW: 13.3 % (ref 11.5–15.5)
WBC: 6.4 10*3/uL (ref 4.0–10.5)

## 2017-04-24 LAB — COMPREHENSIVE METABOLIC PANEL
ALK PHOS: 55 U/L (ref 38–126)
ALT: 14 U/L (ref 14–54)
AST: 19 U/L (ref 15–41)
Albumin: 3.6 g/dL (ref 3.5–5.0)
Anion gap: 11 (ref 5–15)
BUN: 16 mg/dL (ref 6–20)
CHLORIDE: 107 mmol/L (ref 101–111)
CO2: 19 mmol/L — AB (ref 22–32)
CREATININE: 0.8 mg/dL (ref 0.44–1.00)
Calcium: 9.2 mg/dL (ref 8.9–10.3)
GFR calc Af Amer: >60 mL/min (ref >60)
GFR calc non Af Amer: >60 mL/min (ref >60)
GLUCOSE: 217 mg/dL — AB (ref 65–99)
Potassium: 3.3 mmol/L — ABNORMAL LOW (ref 3.5–5.1)
SODIUM: 137 mmol/L (ref 135–145)
Total Bilirubin: 0.7 mg/dL (ref 0.3–1.2)
Total Protein: 7 g/dL (ref 6.5–8.1)

## 2017-04-24 LAB — ECHOCARDIOGRAM COMPLETE
Height: 65 in
Weight: 3583.8 oz

## 2017-04-24 LAB — MRSA PCR SCREENING: MRSA by PCR: NEGATIVE

## 2017-04-24 MED ORDER — GUAIFENESIN 100 MG/5ML PO SOLN
5.0000 mL | ORAL | Status: DC | PRN
  Administered 2017-04-24 (×2): 100 mg via ORAL
  Filled 2017-04-24 (×2): qty 5

## 2017-04-24 MED ORDER — ONDANSETRON HCL 4 MG PO TABS: 4.0000 mg | ORAL_TABLET | Freq: Four times a day (QID) | ORAL | Status: DC | PRN

## 2017-04-24 MED ORDER — METHYLPREDNISOLONE SODIUM SUCC 40 MG IJ SOLR
40.0000 mg | Freq: Four times a day (QID) | INTRAMUSCULAR | Status: DC
  Administered 2017-04-24: 40 mg via INTRAVENOUS
  Filled 2017-04-24: qty 1

## 2017-04-24 MED ORDER — IPRATROPIUM-ALBUTEROL 0.5-2.5 (3) MG/3ML IN SOLN
2.5000 mg | Freq: Four times a day (QID) | RESPIRATORY_TRACT | Status: DC
  Administered 2017-04-24: 2.5 mg via RESPIRATORY_TRACT
  Filled 2017-04-24: qty 3

## 2017-04-24 MED ORDER — ALPRAZOLAM 1 MG PO TABS
1.0000 mg | ORAL_TABLET | Freq: Three times a day (TID) | ORAL | Status: DC | PRN
  Administered 2017-04-24: 1 mg via ORAL
  Filled 2017-04-24: qty 2

## 2017-04-24 MED ORDER — TRAMADOL HCL 50 MG PO TABS: 50.0000 mg | ORAL_TABLET | Freq: Four times a day (QID) | ORAL | Status: DC | PRN

## 2017-04-24 MED ORDER — PRAVASTATIN SODIUM 10 MG PO TABS
20.0000 mg | ORAL_TABLET | Freq: Every evening | ORAL | Status: DC
  Administered 2017-04-24: 20 mg via ORAL
  Filled 2017-04-24: qty 2

## 2017-04-24 MED ORDER — SODIUM CHLORIDE 0.9 % IV SOLN
INTRAVENOUS | Status: DC
  Administered 2017-04-24: 05:00:00 via INTRAVENOUS

## 2017-04-24 MED ORDER — ONDANSETRON HCL 4 MG/2ML IJ SOLN: 4.0000 mg | Freq: Four times a day (QID) | INTRAMUSCULAR | Status: DC | PRN

## 2017-04-24 MED ORDER — POTASSIUM CHLORIDE CRYS ER 20 MEQ PO TBCR
40.0000 meq | EXTENDED_RELEASE_TABLET | ORAL | Status: AC
  Administered 2017-04-24: 40 meq via ORAL
  Filled 2017-04-24 (×2): qty 2

## 2017-04-24 MED ORDER — ALBUTEROL SULFATE (2.5 MG/3ML) 0.083% IN NEBU
2.5000 mg | INHALATION_SOLUTION | RESPIRATORY_TRACT | Status: DC | PRN
  Administered 2017-04-24: 2.5 mg via RESPIRATORY_TRACT
  Filled 2017-04-24: qty 3

## 2017-04-24 MED ORDER — TIZANIDINE HCL 4 MG PO TABS
4.0000 mg | ORAL_TABLET | Freq: Four times a day (QID) | ORAL | Status: DC | PRN
  Filled 2017-04-24: qty 1

## 2017-04-24 MED ORDER — AZITHROMYCIN 250 MG PO TABS
500.0000 mg | ORAL_TABLET | Freq: Every day | ORAL | Status: DC
  Administered 2017-04-24 – 2017-04-25 (×2): 500 mg via ORAL
  Filled 2017-04-24 (×2): qty 2

## 2017-04-24 MED ORDER — IPRATROPIUM BROMIDE 0.02 % IN SOLN: 0.5000 mg | Freq: Four times a day (QID) | RESPIRATORY_TRACT | Status: DC

## 2017-04-24 MED ORDER — METHYLPREDNISOLONE SODIUM SUCC 125 MG IJ SOLR
80.0000 mg | Freq: Four times a day (QID) | INTRAMUSCULAR | Status: DC
  Administered 2017-04-24 – 2017-04-25 (×5): 80 mg via INTRAVENOUS
  Filled 2017-04-24 (×6): qty 2

## 2017-04-24 MED ORDER — ENOXAPARIN SODIUM 40 MG/0.4ML ~~LOC~~ SOLN
40.0000 mg | SUBCUTANEOUS | Status: DC
  Administered 2017-04-24 – 2017-04-25 (×2): 40 mg via SUBCUTANEOUS
  Filled 2017-04-24 (×2): qty 0.4

## 2017-04-24 MED ORDER — CITALOPRAM HYDROBROMIDE 20 MG PO TABS
40.0000 mg | ORAL_TABLET | Freq: Every morning | ORAL | Status: DC
  Administered 2017-04-24 – 2017-04-25 (×2): 40 mg via ORAL
  Filled 2017-04-24 (×2): qty 2

## 2017-04-24 MED ORDER — AMLODIPINE BESYLATE 5 MG PO TABS
10.0000 mg | ORAL_TABLET | Freq: Every morning | ORAL | Status: DC
  Administered 2017-04-24 – 2017-04-25 (×2): 10 mg via ORAL
  Filled 2017-04-24 (×2): qty 2

## 2017-04-24 MED ORDER — METHOCARBAMOL 500 MG PO TABS
1000.0000 mg | ORAL_TABLET | Freq: Three times a day (TID) | ORAL | Status: DC | PRN
Start: 2017-04-24 — End: 2017-04-25

## 2017-04-24 MED ORDER — HYDROCODONE-ACETAMINOPHEN 10-325 MG PO TABS
1.0000 | ORAL_TABLET | Freq: Four times a day (QID) | ORAL | Status: DC | PRN
  Administered 2017-04-24 (×2): 1 via ORAL
  Filled 2017-04-24 (×2): qty 1

## 2017-04-24 MED ORDER — ORAL CARE MOUTH RINSE
15.0000 mL | Freq: Two times a day (BID) | OROMUCOSAL | Status: DC
  Administered 2017-04-25: 15 mL via OROMUCOSAL

## 2017-04-24 MED ORDER — POTASSIUM CHLORIDE CRYS ER 20 MEQ PO TBCR
40.0000 meq | EXTENDED_RELEASE_TABLET | Freq: Once | ORAL | Status: AC
  Administered 2017-04-24: 40 meq via ORAL

## 2017-04-24 MED ORDER — TRAZODONE HCL 50 MG PO TABS
300.0000 mg | ORAL_TABLET | Freq: Every day | ORAL | Status: DC
  Administered 2017-04-24: 300 mg via ORAL
  Filled 2017-04-24: qty 6

## 2017-04-24 MED ORDER — DOCUSATE SODIUM 100 MG PO CAPS
100.0000 mg | ORAL_CAPSULE | Freq: Two times a day (BID) | ORAL | Status: DC
  Administered 2017-04-24 – 2017-04-25 (×3): 100 mg via ORAL
  Filled 2017-04-24 (×3): qty 1

## 2017-04-24 MED ORDER — CLONIDINE HCL 0.1 MG PO TABS
0.1000 mg | ORAL_TABLET | Freq: Every evening | ORAL | Status: DC
  Administered 2017-04-24: 0.1 mg via ORAL
  Filled 2017-04-24: qty 1

## 2017-04-24 NOTE — Progress Notes (Signed)
Pt changed over to partial re-breather mask due to part of a Q-tip stick in her nose from shich she was putting vaseline in her nose. spo2 99%

## 2017-04-24 NOTE — Care Management Note (Signed)
Case Management Note  Patient Details  Name: Rachel [name] MRN: [id number] Date of Birth: May 03, 1956  Subjective/Objective:   Adm with acute bronchitis. From home, ind PTA, uses cane. No Home health. Currently requiring oxygen, no oxygen PTA.  Has PCP, rides [facility] transportation to appointments. Has Medicaid and has no issues affording medications.           Action/Plan: Plans to return home with self care. CM following for needs. Anticipate weaning to room air.    Expected Discharge Date:      04/25/2017            Expected Discharge Plan:  Home/Self Care  In-House Referral:     Discharge planning Services  CM Consult  Post Acute Care Choice:    Choice offered to:     DME Arranged:    DME Agency:     HH Arranged:    HH Agency:     Status of Service:  In process, will continue to follow  If discussed at Long Length of Stay Meetings, dates discussed:    Additional Comments:  Laureen Frederic, Chauncey Reading, RN 04/24/2017, 3:13 PM
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
## 2017-04-24 NOTE — Progress Notes (Signed)
Patient seen and examined, database reviewed. Admitted overnight due to shortness of breath. On review of history had asthma as a child and has been suffering with a URI for the past 10 days. She had extreme shortness of breath that began yesterday. On arrival to the ED was found to be hypoxemic with room air sats of 88-90%. Chest x-ray and CT angiogram negative for respiratory infection or pulmonary embolus. Agree with nebs, will increase steroids given her continued respiratory distress and poor air movement. Will add azithromycin for 5 days for potential acute bronchitis. Will continue to follow. Anticipate discharge home in 24-48 hours.  Domingo Mend, MD [facility] Pager: [phone]

## 2017-04-24 NOTE — Progress Notes (Signed)
*  PRELIMINARY RESULTS* Echocardiogram 2D Echocardiogram has been performed.  Rachel [name] 04/24/2017, 12:57 PM

## 2017-04-25 LAB — BASIC METABOLIC PANEL
ANION GAP: 7 (ref 5–15)
BUN: 19 mg/dL (ref 6–20)
CALCIUM: 9.3 mg/dL (ref 8.9–10.3)
CO2: 23 mmol/L (ref 22–32)
Chloride: 105 mmol/L (ref 101–111)
Creatinine, Ser: 0.84 mg/dL (ref 0.44–1.00)
GLUCOSE: 225 mg/dL — AB (ref 65–99)
POTASSIUM: 4.7 mmol/L (ref 3.5–5.1)
SODIUM: 135 mmol/L (ref 135–145)

## 2017-04-25 LAB — CBC
HEMATOCRIT: 34.1 % — AB (ref 36.0–46.0)
HEMOGLOBIN: 11.2 g/dL — AB (ref 12.0–15.0)
MCH: 30.8 pg (ref 26.0–34.0)
MCHC: 32.8 g/dL (ref 30.0–36.0)
MCV: 93.7 fL (ref 78.0–100.0)
Platelets: 266 10*3/uL (ref 150–400)
RBC: 3.64 MIL/uL — ABNORMAL LOW (ref 3.87–5.11)
RDW: 13.8 % (ref 11.5–15.5)
WBC: 17.3 10*3/uL — AB (ref 4.0–10.5)

## 2017-04-25 LAB — HIV ANTIBODY (ROUTINE TESTING W REFLEX): HIV SCREEN 4TH GENERATION: NONREACTIVE

## 2017-04-25 MED ORDER — PREDNISONE 10 MG PO TABS: 10.0000 mg | ORAL_TABLET | Freq: Every day | ORAL | 0 refills | Status: DC

## 2017-04-25 MED ORDER — AZITHROMYCIN 250 MG PO TABS: ORAL_TABLET | ORAL | 0 refills | Status: AC

## 2017-04-25 NOTE — Progress Notes (Signed)
Inpatient Diabetes Program Recommendations  AACE/ADA: New Consensus Statement on Inpatient Glycemic Control (2015)  Target Ranges:  Prepandial:   less than 140 mg/dL      Peak postprandial:   less than 180 mg/dL (1-2 hours)      Critically ill patients:  140 - 180 mg/dL   Results for RAILEE, BONILLAS (MRN [id number]) as of 04/25/2017 07:38  Ref. Range 04/23/2017 22:11 04/24/2017 05:19 04/25/2017 05:42  Glucose Latest Ref Range: 65 - 99 mg/dL 123 (H) 217 (H) 225 (H)   Review of Glycemic Control  Diabetes history: No Outpatient Diabetes medications: NA Current orders for Inpatient glycemic control: None  Inpatient Diabetes Program Recommendations: Correction (SSI): While inpatient and ordered steroids, please consider ordering CBGs with Novolog correction scale ACHS.  Thanks, Barnie Alderman, RN, MSN, CDE Diabetes Coordinator Inpatient Diabetes Program [phone] (Team Pager from 8am to 5pm)

## 2017-04-25 NOTE — Discharge Summary (Signed)
Physician Discharge Summary  Rachel [name] OJJ:[id number] DOB: November 12, 1955 DOA: 04/23/2017  PCP: Iona Beard, MD  Admit date: 04/23/2017 Discharge date: 04/25/2017  Time spent: 45 minutes  Recommendations for Outpatient Follow-up:  -We'll be discharged home today. -Has been advised to follow-up with primary care provider in 2 weeks.   Discharge Diagnoses:  Active Problems:   Acute bronchitis   Discharge Condition: Stable and improved  Filed Weights   04/23/17 2005 04/24/17 0444  Weight: 86.2 kg (190 lb) 101.6 kg (223 lb 15.8 oz)    History of present illness:  As per Dr. Darrick Meigs on 6/28: Rachel [name]  is a 61 y.o. female, With history of hypertension, hyperlipidemia, osteoarthritis came to hospital with shortness of breath for past 2 days. Patient also complains of coughing up yellow phlegm. Denies hemoptysis. No fever or chills. She does not have history of asthma or COPD. No history of tobacco use. Patient does not use oxygen at home. In the ED O2 sats were found to 88-90% on room air. Patient's breathing improved after receiving Solu-Medrol and DuoNeb nebulizer in the ED.  Chest x-ray showed no acute abnormality. D-dimer was elevated 3.22, CTA negative for pulmonary embolism. She denies nausea vomiting or diarrhea. No dysuria. No abdominal pain. She denies chest pain   Hospital Course:   Acute bronchitis -She has a history of childhood asthma. -She has clinically improved, will discharge home on 5 days of azithromycin and a prednisone taper. -She currently has no oxygen requirements and is ready for discharge home  Rest of chronic medical conditions are stable  Procedures:  None   Consultations:  None  Discharge Instructions  Discharge Instructions    Diet - low sodium heart healthy    Complete by:  As directed    Increase activity slowly    Complete by:  As directed      Allergies as of 04/25/2017      Reactions   Lisinopril Cough   Penicillins Itching   Povidone-iodine Itching   Prednisone Nausea And Vomiting   Betadine [povidone Iodine] Rash      Medication List    STOP taking these medications   ciprofloxacin 250 MG tablet Commonly known as:  CIPRO   predniSONE 10 MG (48) Tbpk tablet Commonly known as:  STERAPRED UNI-PAK 48 TAB Replaced by:  predniSONE 10 MG tablet   vitamin C 500 MG tablet Commonly known as:  ASCORBIC ACID     TAKE these medications   ALPRAZolam 1 MG tablet Commonly known as:  XANAX Take 1 mg by mouth 3 (three) times daily as needed for sleep or anxiety.   amLODipine 10 MG tablet Commonly known as:  NORVASC Take 10 mg by mouth every morning.   azithromycin 250 MG tablet Commonly known as:  ZITHROMAX Z-PAK Take 2 tablets (500 mg) on  Day 1,  followed by 1 tablet (250 mg) once daily on Days 2 through 5.   citalopram 40 MG tablet Commonly known as:  CELEXA Take 40 mg by mouth every morning.   cloNIDine 0.1 MG tablet Commonly known as:  CATAPRES Take 0.1 mg by mouth every evening.   docusate sodium 100 MG capsule Commonly known as:  COLACE Take 100 mg by mouth 2 (two) times daily.   HYDROcodone-acetaminophen 10-325 MG tablet Commonly known as:  NORCO Take 1 tablet by mouth every 6 (six) hours as needed.   lubiprostone 24 MCG capsule Commonly known as:  AMITIZA Take 1 capsule (24 mcg  total) by mouth 2 (two) times daily with a meal.   methocarbamol 500 MG tablet Commonly known as:  ROBAXIN Take 2 tablets (1,000 mg total) by mouth every 8 (eight) hours as needed for muscle spasms.   ondansetron 8 MG tablet Commonly known as:  ZOFRAN Take 1 tablet (8 mg total) by mouth every 4 (four) hours as needed.   pravastatin 20 MG tablet Commonly known as:  PRAVACHOL Take 20 mg by mouth every evening.   predniSONE 10 MG tablet Commonly known as:  DELTASONE Take 1 tablet (10 mg total) by mouth daily with breakfast. Replaces:  predniSONE 10 MG (48) Tbpk tablet   promethazine  25 MG tablet Commonly known as:  PHENERGAN Take 25 mg by mouth every 6 (six) hours as needed for nausea or vomiting.   tiZANidine 4 MG tablet Commonly known as:  ZANAFLEX Take 1 tablet (4 mg total) by mouth every 6 (six) hours as needed for muscle spasms.   traMADol 50 MG tablet Commonly known as:  ULTRAM Take 1 tablet (50 mg total) by mouth every 6 (six) hours as needed. What changed:  reasons to take this      Allergies  Allergen Reactions  . Lisinopril Cough  . Penicillins Itching  . Povidone-Iodine Itching  . Prednisone Nausea And Vomiting  . Betadine [Povidone Iodine] Rash   Follow-up Information    Iona Beard, MD Follow up on 05/05/2017.   Specialty:  Family Medicine Why:  At 11:45am Contact information: [address] [place] [address] [phone]            The results of significant diagnostics from this hospitalization (including imaging, microbiology, ancillary and laboratory) are listed below for reference.    Significant Diagnostic Studies: Dg Chest 2 View  Result Date: 04/23/2017 CLINICAL DATA:  Productive cough and dyspnea for 3 weeks, worsened over the past 2 days. EXAM: CHEST  2 VIEW COMPARISON:  10/04/2014 FINDINGS: The lungs are clear. The pulmonary vasculature is normal. Heart size is normal. Hilar and mediastinal contours are unremarkable. There is no pleural effusion. IMPRESSION: No active cardiopulmonary disease. Electronically Signed   By: Andreas Newport M.D.   On: 04/23/2017 21:16   Ct Angio Chest Pe W/cm &/or Wo Cm  Result Date: 04/23/2017 CLINICAL DATA:  Shortness of breath elevated D-dimer EXAM: CT ANGIOGRAPHY CHEST WITH CONTRAST TECHNIQUE: Multidetector CT imaging of the chest was performed using the standard protocol during bolus administration of intravenous contrast. Multiplanar CT image reconstructions and MIPs were obtained to evaluate the vascular anatomy. CONTRAST:  100 mL Isovue 370 intravenous COMPARISON:  Radiograph  04/23/2017 FINDINGS: Cardiovascular: Less than optimal opacification of the pulmonary arteries to the segmental level. No definite central or main segmental filling defects are visualized to suggest embolus. Non aneurysmal aorta. Aortic atherosclerosis. Borderline cardiomegaly. No significant pericardial effusion. Mediastinum/Nodes: Mild mediastinal adenopathy. Right paratracheal lymph node measures 15 mm. Precarinal lymph node measures 9 mm. No significant hilar adenopathy. Midline trachea. No thyroid mass. Esophagus within normal limits. Small hiatal hernia. Lungs/Pleura: No pleural effusion or pneumothorax. Linear atelectasis along the right pulmonary fissure. Patchy atelectasis at the lung bases. Upper Abdomen: No acute abnormality. Musculoskeletal: Degenerative changes of the spine. No acute or suspicious bone lesion. Review of the MIP images confirms the above findings. IMPRESSION: 1. Suboptimal contrast opacification of the pulmonary arterial system, no gross central embolus is visualized. 2. Mild mediastinal adenopathy 3. Small esophageal hiatal hernia Aortic Atherosclerosis (ICD10-I70.0). Electronically Signed   By:  Donavan Foil M.D.   On: 04/23/2017 23:57    Microbiology: Recent Results (from the past 240 hour(s))  MRSA PCR Screening     Status: None   Collection Time: 04/24/17  4:19 AM  Result Value Ref Range Status   MRSA by PCR NEGATIVE NEGATIVE Final    Comment:        The GeneXpert MRSA Assay (FDA approved for NASAL specimens only), is one component of a comprehensive MRSA colonization surveillance program. It is not intended to diagnose MRSA infection nor to guide or monitor treatment for MRSA infections.      Labs: Basic Metabolic Panel:  Recent Labs Lab 04/23/17 2211 04/24/17 0519 04/25/17 0542  NA 138 137 135  K 2.7* 3.3* 4.7  CL 103 107 105  CO2 22 19* 23  GLUCOSE 123* 217* 225*  BUN 20 16 19   CREATININE 1.00 0.80 0.84  CALCIUM 9.7 9.2 9.3   Liver Function  Tests:  Recent Labs Lab 04/23/17 2211 04/24/17 0519  AST 23 19  ALT 17 14  ALKPHOS 61 55  BILITOT 0.6 0.7  PROT 7.8 7.0  ALBUMIN 4.1 3.6   No results for input(s): LIPASE, AMYLASE in the last 168 hours. No results for input(s): AMMONIA in the last 168 hours. CBC:  Recent Labs Lab 04/23/17 2211 04/24/17 0519 04/25/17 0542  WBC 10.1 6.4 17.3*  NEUTROABS 5.9  --   --   HGB 13.7 12.0 11.2*  HCT 39.6 35.4* 34.1*  MCV 89.8 90.1 93.7  PLT 288 267 266   Cardiac Enzymes: No results for input(s): CKTOTAL, CKMB, CKMBINDEX, TROPONINI in the last 168 hours. BNP: BNP (last 3 results) No results for input(s): BNP in the last 8760 hours.  ProBNP (last 3 results) No results for input(s): PROBNP in the last 8760 hours.  CBG: No results for input(s): GLUCAP in the last 168 hours.     SignedLelon Frohlich  [facility] Pager: [phone] 04/25/2017, 4:48 PM

## 2017-04-25 NOTE — Progress Notes (Signed)
Pt called to the nurses station and stated she had a Q-tip stuck up her nose around 2115.  Upon arriving in the room she stated she had been putting Vaseline in her nose due to dryness and the Q-tip broke off and she was not able to get the piece out of her nose.  She had been trying to get the Q-tip out with her own tweezers.  No distress was noted and her airway was patent.  I obtained a light and looked in the left nostril for the Q-tip to see if I could remove it and I did not see it.  I called Dr. Darrick Meigs to come to the bedside and the nursing supervisor.  Pt reported feeling the Q-tip in her left nostril and her throat.  Dr. Darrick Meigs came to the room to assess patient and he was unable to visualize the Q-tip as well.  Dr. Darrick Meigs notified the ER physician and asked him to attempt to remove the Q-tip. Dr. Darrick Meigs instructed me to have the patient attempt to blow her nose to dislodge the Q-tip.  The patient's attempts to blow her nose were not successful.   At approximately 2230 the ER physician came to the patient's room and was able to reach to Q-tip from her throat via her mouth.  No distress was noted and the patient reports she no longer feels the presence of the foreign body.  Pt was educated not to place Q-tips in her nose and that it is not safe to use Vaseline with oxygen therapy due to it's flammable nature.  She verbalized understanding and denies further complaints of distress or discomfort.

## 2017-04-25 NOTE — Progress Notes (Signed)
Discharge instructions and prescriptions given, verbalized understanding, out in stable condition via w/c with staff. 

## 2017-04-25 NOTE — Progress Notes (Signed)
SATURATION QUALIFICATIONS: (This note is used to comply with regulatory documentation for home oxygen)  Patient Saturations on Room Air at Rest = 100%  Patient Saturations on Room Air while Ambulating = 98% 

## 2017-04-29 ENCOUNTER — Ambulatory Visit (INDEPENDENT_AMBULATORY_CARE_PROVIDER_SITE_OTHER): Payer: Medicaid Other | Admitting: Orthopedic Surgery

## 2017-04-29 DIAGNOSIS — M1732 Unilateral post-traumatic osteoarthritis, left knee: Secondary | ICD-10-CM | POA: Diagnosis not present

## 2017-04-29 DIAGNOSIS — M1711 Unilateral primary osteoarthritis, right knee: Secondary | ICD-10-CM

## 2017-04-29 NOTE — Progress Notes (Signed)
Chief complaint request bilateral knee injections  The patient has received bilateral knee injections in the past she has chronic osteoarthritis of the knee. We cannot do surgery on her but injection seems to be good in controlling her symptoms   Procedure note left knee injection verbal consent was obtained to inject left knee joint  Timeout was completed to confirm the site of injection  The medications used were 40 mg of Depo-Medrol and 1% lidocaine 3 cc  Anesthesia was provided by ethyl chloride and the skin was prepped with alcohol.  After cleaning the skin with alcohol a 20-gauge needle was used to inject the left knee joint. There were no complications. A sterile bandage was applied.   Procedure note right knee injection verbal consent was obtained to inject right knee joint  Timeout was completed to confirm the site of injection  The medications used were 40 mg of Depo-Medrol and 1% lidocaine 3 cc  Anesthesia was provided by ethyl chloride and the skin was prepped with alcohol.  After cleaning the skin with alcohol a 20-gauge needle was used to inject the right knee joint. There were no complications. A sterile bandage was applied.

## 2017-04-29 NOTE — Patient Instructions (Signed)

## 2017-05-13 ENCOUNTER — Other Ambulatory Visit (HOSPITAL_COMMUNITY): Payer: Self-pay | Admitting: Family Medicine

## 2017-05-13 DIAGNOSIS — Z1231 Encounter for screening mammogram for malignant neoplasm of breast: Secondary | ICD-10-CM

## 2017-05-29 ENCOUNTER — Ambulatory Visit (INDEPENDENT_AMBULATORY_CARE_PROVIDER_SITE_OTHER): Payer: Medicaid Other | Admitting: Adult Health

## 2017-05-29 ENCOUNTER — Encounter: Payer: Self-pay | Admitting: Adult Health

## 2017-05-29 VITALS — BP 156/80 | HR 60 | Ht 61.0 in | Wt 218.0 lb

## 2017-05-29 DIAGNOSIS — Z1212 Encounter for screening for malignant neoplasm of rectum: Secondary | ICD-10-CM | POA: Diagnosis not present

## 2017-05-29 DIAGNOSIS — K5903 Drug induced constipation: Secondary | ICD-10-CM

## 2017-05-29 DIAGNOSIS — Z0000 Encounter for general adult medical examination without abnormal findings: Secondary | ICD-10-CM

## 2017-05-29 DIAGNOSIS — Z01419 Encounter for gynecological examination (general) (routine) without abnormal findings: Secondary | ICD-10-CM | POA: Insufficient documentation

## 2017-05-29 DIAGNOSIS — T402X5A Adverse effect of other opioids, initial encounter: Secondary | ICD-10-CM

## 2017-05-29 DIAGNOSIS — Z1211 Encounter for screening for malignant neoplasm of colon: Secondary | ICD-10-CM | POA: Diagnosis not present

## 2017-05-29 LAB — HEMOCCULT GUIAC POC 1CARD (OFFICE): FECAL OCCULT BLD: NEGATIVE

## 2017-05-29 MED ORDER — LUBIPROSTONE 24 MCG PO CAPS: 24.0000 ug | ORAL_CAPSULE | Freq: Two times a day (BID) | ORAL | 6 refills | Status: DC

## 2017-05-29 NOTE — Progress Notes (Signed)
Patient ID: Rachel [name], female   DOB: 09/14/1956, 62 y.o.   MRN: [id number] History of Present Illness: Rachel [name] is a 61 year old black female in for well woman gyn exam, she had normal pap with negative HPV 05/27/16.  PCP is Dr Berdine Addison and see Dr Aline Brochure about knees.   Current Medications, Allergies, Past Medical History, Past Surgical History, Family History and Social History were reviewed in [employer] record.     Review of Systems: Patient denies any headaches, hearing loss, fatigue, blurred vision, shortness of breath, chest pain, abdominal pain, problems with bowel movements, urination, or intercourse(not having sex). No mood swings.pain in knees at times.    Physical Exam:BP (!) 156/80 (BP Location: Right Arm, Patient Position: Sitting, Cuff Size: Large)   Pulse 60   Ht 5\' 1"  (1.549 m)   Wt 218 lb (98.9 kg)   BMI 41.19 kg/m She has lost 24 lbs since last physical  General:  Well developed, well nourished, no acute distress Skin:  Warm and dry Neck:  Midline trachea, normal thyroid, good ROM, no lymphadenopathy,no carotid bruits heard  Lungs; Clear to auscultation bilaterally Breast:  No dominant palpable mass, retraction, or nipple discharge Cardiovascular: Regular rate and rhythm Abdomen:  Soft, non tender, no hepatosplenomegaly Pelvic:  External genitalia is normal in appearance, no lesions.  The vagina is normal in appearance. Urethra has no lesions or masses. The cervix is bulbous.  Uterus is felt to be normal size, shape, and contour.  No adnexal masses or tenderness noted.Bladder is non tender, no masses felt. Rectal: Good sphincter tone, no polyps, or hemorrhoids felt.  Hemoccult negative. Extremities/musculoskeletal:  No swelling or varicosities noted, no clubbing or cyanosis Psych:  No mood changes, alert and cooperative,seems happy PHQ 2 score 0.Praised over weight loss, which should help those knees.   Impression: 1. Well woman exam  with routine gynecological exam   2. Screening for colorectal cancer   3. Constipation due to opioid therapy       Plan: Meds ordered this encounter  Medications  . lubiprostone (AMITIZA) 24 MCG capsule    Sig: Take 1 capsule (24 mcg total) by mouth 2 (two) times daily with a meal.    Dispense:  60 capsule    Refill:  6    Order Specific Question:   Supervising Provider    Answer:   Florian Buff [[id number]]  Physical in 1 year, pap in 2020 Mammogram yearly Labs with PCP

## 2017-06-16 ENCOUNTER — Ambulatory Visit (HOSPITAL_COMMUNITY): Payer: Medicaid Other

## 2017-07-02 ENCOUNTER — Ambulatory Visit (HOSPITAL_COMMUNITY)
Admission: RE | Admit: 2017-07-02 | Discharge: 2017-07-02 | Disposition: A | Discharge status: Home / Self Care | Payer: Medicaid Other | Source: Ambulatory Visit | Attending: Family Medicine | Admitting: Family Medicine

## 2017-07-02 DIAGNOSIS — Z1231 Encounter for screening mammogram for malignant neoplasm of breast: Secondary | ICD-10-CM | POA: Diagnosis present

## 2017-07-08 ENCOUNTER — Encounter: Payer: Self-pay | Admitting: Sports Medicine

## 2017-07-08 ENCOUNTER — Ambulatory Visit (INDEPENDENT_AMBULATORY_CARE_PROVIDER_SITE_OTHER): Payer: Self-pay | Admitting: Sports Medicine

## 2017-07-08 DIAGNOSIS — M79671 Pain in right foot: Secondary | ICD-10-CM

## 2017-07-08 DIAGNOSIS — B359 Dermatophytosis, unspecified: Secondary | ICD-10-CM

## 2017-07-08 MED ORDER — ALUMINUM CHLORIDE 20 % EX SOLN: Freq: Every day | CUTANEOUS | 5 refills | Status: DC

## 2017-07-08 MED ORDER — KETOCONAZOLE 2 % EX CREA: 1.0000 "application " | TOPICAL_CREAM | Freq: Every day | CUTANEOUS | 5 refills | Status: DC

## 2017-07-08 NOTE — Progress Notes (Signed)
Patient ID: Rachel [name], female   DOB: 18-Apr-1956, 61 y.o.   MRN: [id number]  Subjective: Rachel [name] is a 61 y.o. female patient seen today in office for evaluation of rash to right foot that has been there for 3 weeks patient states that she used over the counter cream that helps. States that it was itchy but now is better. Reports that she has no other issues like nausea/vomitting/fever or chills. Reports no travel or contact with allergies or outdoor exposure. Patient denies any other pedal complaints.    Patient Active Problem List   Diagnosis Date Noted  . Well woman exam with routine gynecological exam 05/29/2017  . Acute bronchitis 04/24/2017  . Constipation due to opioid therapy 05/27/2016  . Obesity 05/22/2015  . Mild protein-calorie malnutrition ([place]) 12/14/2013  . Dehydration 12/12/2013  . Colitis, acute 12/12/2013  . Hypokalemia 12/12/2013  . C. difficile colitis 12/11/2013  . Abdominal pain 11/07/2013  . Colitis 11/07/2013  . Acute colitis 11/07/2013  . Arthralgia of hip 09/30/2013  . Degenerative arthritis of hip 09/30/2013  . Arthritis of knee, right 09/02/2013  . Osteoarthritis of left knee 04/13/2013  . OA (osteoarthritis) of knee 01/16/2012  . Acquired trigger finger 11/21/2011  . ARTHRITIS, LEFT KNEE 07/12/2010  . NECK PAIN 05/31/2010  . JOINT EFFUSION, LEFT KNEE 04/05/2010  . RUPTURE ROTATOR CUFF 05/03/2009  . THYROID STIMULATING HORMONE, ABNORMAL 04/19/2009  . IMPINGEMENT SYNDROME 03/29/2009  . NUMBNESS, ARM 03/29/2009  . SKIN RASH 03/08/2009  . HIP, ARTHRITIS, DEGEN./OSTEO 09/26/2008  . ALLERGIC RHINITIS 08/11/2008  . HIP PAIN, RIGHT 07/19/2008  . SYMPTOMATIC MENOPAUSAL/FEMALE CLIMACTERIC STATES 03/17/2008  . SHOULDER PAIN 01/25/2008  . HYPERLIPIDEMIA 09/27/2006  . OBESITY 09/27/2006  . ANXIETY 09/27/2006  . DEPRESSION 09/27/2006  . HYPERTENSION 09/27/2006  . OSTEOARTHRITIS 09/27/2006  . MEDIAL MENISCUS TEAR, LEFT 09/27/2006    Current Outpatient Prescriptions on File Prior to Visit  Medication Sig Dispense Refill  . ALPRAZolam (XANAX) 1 MG tablet Take 1 mg by mouth 3 (three) times daily as needed for sleep or anxiety.     Marland Kitchen amLODipine (NORVASC) 10 MG tablet Take 10 mg by mouth every morning.     . citalopram (CELEXA) 40 MG tablet Take 40 mg by mouth every morning.    . cloNIDine (CATAPRES) 0.1 MG tablet Take 0.1 mg by mouth every evening.     . docusate sodium (COLACE) 100 MG capsule Take 100 mg by mouth 2 (two) times daily.    Marland Kitchen HYDROcodone-acetaminophen (NORCO) 10-325 MG tablet Take 1 tablet by mouth every 6 (six) hours as needed.    . lubiprostone (AMITIZA) 24 MCG capsule Take 1 capsule (24 mcg total) by mouth 2 (two) times daily with a meal. 60 capsule 6  . methocarbamol (ROBAXIN) 500 MG tablet Take 2 tablets (1,000 mg total) by mouth every 8 (eight) hours as needed for muscle spasms. 30 tablet 0  . ondansetron (ZOFRAN) 8 MG tablet Take 1 tablet (8 mg total) by mouth every 4 (four) hours as needed. 8 tablet 0  . pravastatin (PRAVACHOL) 20 MG tablet Take 20 mg by mouth every evening.     Marland Kitchen tiZANidine (ZANAFLEX) 4 MG tablet Take 1 tablet (4 mg total) by mouth every 6 (six) hours as needed for muscle spasms. 60 tablet 2  . traMADol (ULTRAM) 50 MG tablet Take 1 tablet (50 mg total) by mouth every 6 (six) hours as needed. 15 tablet 0  . [DISCONTINUED] famotidine (PEPCID) 20 MG tablet Take  20 mg by mouth 2 (two) times daily.     No current facility-administered medications on file prior to visit.    Allergies  Allergen Reactions  . Lisinopril Cough  . Penicillins Itching  . Povidone-Iodine Itching  . Prednisone Nausea And Vomiting  . Betadine [Povidone Iodine] Rash   Objective: General: No acute distress, AAOx3 Cane assisted gait Right foot: Incisions well healed at previous surgical sites, no ecchymosis, no erythema, no warmth, no drainage, no signs of infection noted, + scaly skin at interspaces on right  and at lateral foot on right suggestive of tinea, Capillary fill time <3 seconds in all digits, gross sensation present via light touch to right foot. No pain or crepitation with range of motion right foot. No pain with calf compression.   Assessment and Plan:  Problem List Items Addressed This Visit    None    Visit Diagnoses    Tinea    -  Primary   Relevant Medications   ketoconazole (NIZORAL) 2 % cream   Right foot pain         -Patient seen and evaluated -Rx Ketoconazole and drysol spray as instructed or betadine in between toes -Recommend good hygiene habits -Patient to return in 6 weeks for follow up eval. In the meantime, patient to call office if any issues or problems arise. Landis Martins, DPM

## 2017-07-15 ENCOUNTER — Telehealth: Payer: Self-pay | Admitting: Orthopedic Surgery

## 2017-07-15 NOTE — Telephone Encounter (Signed)
Patient has an appointment on the 8th regarding knee injections.  She said at that time she doesn't want injections in her knees but wants to have them in her shoulders.  I told her that she would have to make a different appointment for her shoulders.  She said she had seen him before and that he did surgery on her.  I told her that I really couldn't speak for the doctor but that her shoulder pain would have to be evaluated and then the doctor would make the decision as to what needed to be done on her behalf.  She understood and said she would come on the 8th.

## 2017-08-04 ENCOUNTER — Encounter: Payer: Self-pay | Admitting: Orthopedic Surgery

## 2017-08-04 ENCOUNTER — Ambulatory Visit (INDEPENDENT_AMBULATORY_CARE_PROVIDER_SITE_OTHER): Payer: Medicaid Other | Admitting: Orthopedic Surgery

## 2017-08-04 VITALS — BP 148/75 | HR 76 | Resp 16 | Ht 61.0 in | Wt 207.0 lb

## 2017-08-04 DIAGNOSIS — M25512 Pain in left shoulder: Secondary | ICD-10-CM

## 2017-08-04 DIAGNOSIS — M25511 Pain in right shoulder: Secondary | ICD-10-CM | POA: Diagnosis not present

## 2017-08-04 MED ORDER — METHYLPREDNISOLONE ACETATE 40 MG/ML IJ SUSP
40.0000 mg | Freq: Once | INTRAMUSCULAR | Status: AC
  Administered 2017-08-04 (×2): 40 mg via INTRA_ARTICULAR

## 2017-08-04 NOTE — Addendum Note (Signed)
Addended byCandice Camp on: 08/04/2017 01:51 PM   Modules accepted: Orders

## 2017-08-04 NOTE — Progress Notes (Signed)
Chief Complaint  Patient presents with  . Shoulder Pain    bilateral shoulders are painful, asking for injections, states will have left hip and knee replaced in [place] soon     Procedure note the subacromial injection shoulder left   Verbal consent was obtained to inject the  Left   Shoulder  Timeout was completed to confirm the injection site is a subacromial space of the  left  shoulder  Medication used Depo-Medrol 40 mg and lidocaine 1% 3 cc  Anesthesia was provided by ethyl chloride  The injection was performed in the left  posterior subacromial space. After pinning the skin with alcohol and anesthetized the skin with ethyl chloride the subacromial space was injected using a 20-gauge needle. There were no complications  Sterile dressing was applied.         Procedure note the subacromial injection shoulder RIGHT  Verbal consent was obtained to inject the  RIGHT   Shoulder  Timeout was completed to confirm the injection site is a subacromial space of the  RIGHT  shoulder   Medication used Depo-Medrol 40 mg and lidocaine 1% 3 cc  Anesthesia was provided by ethyl chloride  The injection was performed in the RIGHT  posterior subacromial space. After pinning the skin with alcohol and anesthetized the skin with ethyl chloride the subacromial space was injected using a 20-gauge needle. There were no complications  Sterile dressing was applied.

## 2017-08-19 ENCOUNTER — Ambulatory Visit: Payer: Medicaid Other | Admitting: Sports Medicine

## 2017-11-10 ENCOUNTER — Ambulatory Visit: Payer: Medicaid Other | Admitting: Orthopedic Surgery

## 2017-11-21 ENCOUNTER — Encounter: Payer: Self-pay | Admitting: Orthopedic Surgery

## 2017-11-21 ENCOUNTER — Ambulatory Visit: Payer: Medicaid Other | Admitting: Orthopedic Surgery

## 2017-11-21 VITALS — BP 111/66 | HR 68 | Ht 60.0 in | Wt 205.0 lb

## 2017-11-21 DIAGNOSIS — M25512 Pain in left shoulder: Secondary | ICD-10-CM

## 2017-11-21 DIAGNOSIS — M25511 Pain in right shoulder: Secondary | ICD-10-CM

## 2017-11-21 NOTE — Progress Notes (Signed)
Office visit  Chief Complaint  Patient presents with  . Shoulder Pain    bilateral     Procedure note the subacromial injection shoulder left   Verbal consent was obtained to inject the  Left   Shoulder  Timeout was completed to confirm the injection site is a subacromial space of the  left  shoulder  Medication used Depo-Medrol 40 mg and lidocaine 1% 3 cc  Anesthesia was provided by ethyl chloride  The injection was performed in the left  posterior subacromial space. After pinning the skin with alcohol and anesthetized the skin with ethyl chloride the subacromial space was injected using a 20-gauge needle. There were no complications  Sterile dressing was applied.     Procedure note the subacromial injection shoulder RIGHT  Verbal consent was obtained to inject the  RIGHT   Shoulder  Timeout was completed to confirm the injection site is a subacromial space of the  RIGHT  shoulder   Medication used Depo-Medrol 40 mg and lidocaine 1% 3 cc  Anesthesia was provided by ethyl chloride  The injection was performed in the RIGHT  posterior subacromial space. After pinning the skin with alcohol and anesthetized the skin with ethyl chloride the subacromial space was injected using a 20-gauge needle. There were no complications  Sterile dressing was applied.  Encounter Diagnosis  Name Primary?  . Pain of both shoulder joints Yes

## 2017-12-22 ENCOUNTER — Ambulatory Visit (INDEPENDENT_AMBULATORY_CARE_PROVIDER_SITE_OTHER): Payer: Medicaid Other | Admitting: Otolaryngology

## 2018-01-17 ENCOUNTER — Emergency Department (HOSPITAL_COMMUNITY)
Admission: EM | Admit: 2018-01-17 | Discharge: 2018-01-17 | Disposition: A | Discharge status: Home / Self Care | Payer: Medicaid Other | Attending: Emergency Medicine | Admitting: Emergency Medicine

## 2018-01-17 ENCOUNTER — Emergency Department (HOSPITAL_COMMUNITY): Payer: Medicaid Other

## 2018-01-17 ENCOUNTER — Encounter (HOSPITAL_COMMUNITY): Payer: Self-pay | Admitting: Emergency Medicine

## 2018-01-17 ENCOUNTER — Other Ambulatory Visit: Payer: Self-pay

## 2018-01-17 DIAGNOSIS — M25552 Pain in left hip: Secondary | ICD-10-CM | POA: Diagnosis present

## 2018-01-17 DIAGNOSIS — Z79899 Other long term (current) drug therapy: Secondary | ICD-10-CM | POA: Insufficient documentation

## 2018-01-17 DIAGNOSIS — I10 Essential (primary) hypertension: Secondary | ICD-10-CM | POA: Diagnosis not present

## 2018-01-17 DIAGNOSIS — M1652 Unilateral post-traumatic osteoarthritis, left hip: Secondary | ICD-10-CM | POA: Insufficient documentation

## 2018-01-17 MED ORDER — HYDROMORPHONE HCL 1 MG/ML IJ SOLN
1.0000 mg | Freq: Once | INTRAMUSCULAR | Status: AC
  Administered 2018-01-17: 1 mg via INTRAMUSCULAR
  Filled 2018-01-17: qty 1

## 2018-01-17 MED ORDER — ONDANSETRON 4 MG PO TBDP
4.0000 mg | ORAL_TABLET | Freq: Once | ORAL | Status: AC
  Administered 2018-01-17: 4 mg via ORAL

## 2018-01-17 MED ORDER — ONDANSETRON 4 MG PO TBDP
ORAL_TABLET | ORAL | Status: AC
  Filled 2018-01-17: qty 1

## 2018-01-17 MED ORDER — KETOROLAC TROMETHAMINE 30 MG/ML IJ SOLN
30.0000 mg | Freq: Once | INTRAMUSCULAR | Status: AC
Start: 2018-01-17 — End: 2018-01-17
  Administered 2018-01-17: 30 mg via INTRAMUSCULAR
  Filled 2018-01-17: qty 1

## 2018-01-17 MED ORDER — OXYCODONE-ACETAMINOPHEN 5-325 MG PO TABS: 1.0000 | ORAL_TABLET | ORAL | 0 refills | Status: DC | PRN

## 2018-01-17 NOTE — Discharge Instructions (Addendum)
Use the percocet for pain relief in place of your hydrocodone for the next 2 days.  Apply a heating pad for 20 minutes to your area of pain.  Do not drive within 4 hours of taking this medicine as it will make you drowsy.

## 2018-01-17 NOTE — ED Notes (Signed)
Patient transported to X-ray 

## 2018-01-17 NOTE — ED Notes (Signed)
Pt returned from xray

## 2018-01-17 NOTE — ED Triage Notes (Signed)
Patient reports Left hip pain that started last night. No known injury. Patient has remote history of fracture with rod placement in 1995. Patient reports taking Aleve this am with no relief of symptoms.
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
## 2018-01-20 ENCOUNTER — Other Ambulatory Visit: Payer: Self-pay

## 2018-01-20 ENCOUNTER — Emergency Department (HOSPITAL_COMMUNITY)
Admission: EM | Admit: 2018-01-20 | Discharge: 2018-01-20 | Disposition: A | Discharge status: Home / Self Care | Payer: Medicaid Other | Attending: Emergency Medicine | Admitting: Emergency Medicine

## 2018-01-20 ENCOUNTER — Encounter (HOSPITAL_COMMUNITY): Payer: Self-pay | Admitting: Emergency Medicine

## 2018-01-20 ENCOUNTER — Emergency Department (HOSPITAL_COMMUNITY): Payer: Medicaid Other

## 2018-01-20 DIAGNOSIS — M25552 Pain in left hip: Secondary | ICD-10-CM | POA: Diagnosis present

## 2018-01-20 DIAGNOSIS — I10 Essential (primary) hypertension: Secondary | ICD-10-CM | POA: Insufficient documentation

## 2018-01-20 DIAGNOSIS — M545 Low back pain, unspecified: Secondary | ICD-10-CM

## 2018-01-20 DIAGNOSIS — Z79899 Other long term (current) drug therapy: Secondary | ICD-10-CM | POA: Insufficient documentation

## 2018-01-20 MED ORDER — HYDROMORPHONE HCL 2 MG/ML IJ SOLN
2.0000 mg | Freq: Once | INTRAMUSCULAR | Status: AC
  Administered 2018-01-20: 2 mg via INTRAMUSCULAR
  Filled 2018-01-20: qty 1

## 2018-01-20 MED ORDER — CYCLOBENZAPRINE HCL 10 MG PO TABS: 10.0000 mg | ORAL_TABLET | Freq: Two times a day (BID) | ORAL | 0 refills | Status: DC | PRN

## 2018-01-20 MED ORDER — DIPHENHYDRAMINE HCL 25 MG PO CAPS
25.0000 mg | ORAL_CAPSULE | Freq: Once | ORAL | Status: AC
  Administered 2018-01-20: 25 mg via ORAL
  Filled 2018-01-20: qty 1

## 2018-01-20 NOTE — ED Notes (Signed)
Pt transported to CT ?
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
## 2018-01-20 NOTE — ED Notes (Signed)
Pt returned from CT °

## 2018-01-20 NOTE — ED Notes (Addendum)
Pt c/o generalized itching after IM Dilaudid injection. No SOB or rash noted. EDP notified. 25mg  Benadryl PO verbal order given.

## 2018-01-20 NOTE — ED Notes (Signed)
Pt continuously calling out to nurses station wanting to see doctor. Explained that he is in with a critical patient.

## 2018-01-20 NOTE — ED Notes (Signed)
EDP at bedside  

## 2018-01-20 NOTE — ED Notes (Signed)
Pt upset that she has been here this long and has not seen a doctor. Explained to patient that EDP has been with critical patient.

## 2018-01-20 NOTE — ED Notes (Signed)
Pt continues to press call light wanting pain medication and to see doctor.

## 2018-01-20 NOTE — Discharge Instructions (Addendum)
Symptoms today consistent with lumbar back pain.  Do not feel that this is related to your hip problem.  CT scan of the lumbar vertebrae show no evidence of any acute fractures however there is a lot of degenerative changes in that area that could explain the pain.  Recommend following up with orthopedics give Dr. Ruthe Mannan office a call.  Take your pain medicine that you have at home.  And add on the Flexeril.  Rest as much as possible.

## 2018-01-20 NOTE — ED Triage Notes (Signed)
Pt c/o continued LT hip pain without injury/fall. Pt evaluated here on Sat. with same complaint. Pt sent home with Percocet and reports no relief. Hx of surgery on LT hip.

## 2018-05-07 ENCOUNTER — Other Ambulatory Visit (HOSPITAL_COMMUNITY): Payer: Self-pay | Admitting: Family Medicine

## 2018-05-07 DIAGNOSIS — Z1231 Encounter for screening mammogram for malignant neoplasm of breast: Secondary | ICD-10-CM

## 2018-06-04 ENCOUNTER — Other Ambulatory Visit: Payer: Self-pay | Admitting: Adult Health

## 2018-06-14 ENCOUNTER — Other Ambulatory Visit: Payer: Self-pay

## 2018-06-14 ENCOUNTER — Emergency Department (HOSPITAL_COMMUNITY): Payer: Medicaid Other

## 2018-06-14 ENCOUNTER — Encounter (HOSPITAL_COMMUNITY): Payer: Self-pay | Admitting: *Deleted

## 2018-06-14 ENCOUNTER — Emergency Department (HOSPITAL_COMMUNITY)
Admission: EM | Admit: 2018-06-14 | Discharge: 2018-06-14 | Disposition: A | Discharge status: Home / Self Care | Payer: Medicaid Other | Attending: Emergency Medicine | Admitting: Emergency Medicine

## 2018-06-14 DIAGNOSIS — R51 Headache: Secondary | ICD-10-CM | POA: Insufficient documentation

## 2018-06-14 DIAGNOSIS — M7918 Myalgia, other site: Secondary | ICD-10-CM | POA: Diagnosis not present

## 2018-06-14 DIAGNOSIS — R6883 Chills (without fever): Secondary | ICD-10-CM | POA: Insufficient documentation

## 2018-06-14 DIAGNOSIS — Z7902 Long term (current) use of antithrombotics/antiplatelets: Secondary | ICD-10-CM | POA: Diagnosis not present

## 2018-06-14 DIAGNOSIS — Z79899 Other long term (current) drug therapy: Secondary | ICD-10-CM | POA: Diagnosis not present

## 2018-06-14 DIAGNOSIS — R05 Cough: Secondary | ICD-10-CM | POA: Diagnosis present

## 2018-06-14 DIAGNOSIS — R062 Wheezing: Secondary | ICD-10-CM | POA: Insufficient documentation

## 2018-06-14 DIAGNOSIS — J209 Acute bronchitis, unspecified: Secondary | ICD-10-CM

## 2018-06-14 DIAGNOSIS — J40 Bronchitis, not specified as acute or chronic: Secondary | ICD-10-CM | POA: Diagnosis not present

## 2018-06-14 MED ORDER — PREDNISONE 20 MG PO TABS: 40.0000 mg | ORAL_TABLET | Freq: Once | ORAL | Status: DC

## 2018-06-14 MED ORDER — ONDANSETRON HCL 4 MG PO TABS
4.0000 mg | ORAL_TABLET | Freq: Once | ORAL | Status: AC
  Administered 2018-06-14: 4 mg via ORAL
  Filled 2018-06-14: qty 1

## 2018-06-14 MED ORDER — DOXYCYCLINE HYCLATE 100 MG PO TABS
100.0000 mg | ORAL_TABLET | Freq: Once | ORAL | Status: AC
  Administered 2018-06-14: 100 mg via ORAL
  Filled 2018-06-14: qty 1

## 2018-06-14 MED ORDER — DOXYCYCLINE HYCLATE 100 MG PO CAPS: 100.0000 mg | ORAL_CAPSULE | Freq: Two times a day (BID) | ORAL | 0 refills | Status: DC

## 2018-06-14 MED ORDER — PREDNISONE 20 MG PO TABS
40.0000 mg | ORAL_TABLET | Freq: Once | ORAL | Status: AC
  Administered 2018-06-14: 40 mg via ORAL
  Filled 2018-06-14: qty 2

## 2018-06-14 MED ORDER — ALBUTEROL SULFATE (2.5 MG/3ML) 0.083% IN NEBU
2.5000 mg | INHALATION_SOLUTION | Freq: Once | RESPIRATORY_TRACT | Status: AC
  Administered 2018-06-14: 2.5 mg via RESPIRATORY_TRACT
  Filled 2018-06-14: qty 3

## 2018-06-14 MED ORDER — ONDANSETRON HCL 4 MG PO TABS: 4.0000 mg | ORAL_TABLET | Freq: Four times a day (QID) | ORAL | 0 refills | Status: DC

## 2018-06-14 MED ORDER — PROMETHAZINE HCL 12.5 MG PO TABS
12.5000 mg | ORAL_TABLET | Freq: Once | ORAL | Status: AC
  Administered 2018-06-14: 12.5 mg via ORAL
  Filled 2018-06-14: qty 1

## 2018-06-14 MED ORDER — DEXAMETHASONE 4 MG PO TABS: 4.0000 mg | ORAL_TABLET | Freq: Two times a day (BID) | ORAL | 0 refills | Status: DC

## 2018-06-14 MED ORDER — ALBUTEROL SULFATE HFA 108 (90 BASE) MCG/ACT IN AERS
2.0000 | INHALATION_SPRAY | Freq: Once | RESPIRATORY_TRACT | Status: AC
  Administered 2018-06-14: 2 via RESPIRATORY_TRACT
  Filled 2018-06-14: qty 6.7

## 2018-06-14 NOTE — ED Triage Notes (Signed)
Pt c/o productive cough that is yellow and brown in color, sore throat, soreness in her chest upon coughing and chills since Thursday. Pt reports she has used Robitussin and Alka Seltzer cold and flu with no relief. Denies fever.

## 2018-06-14 NOTE — Discharge Instructions (Addendum)
Your vital signs are within normal limits.  Your oxygen level is within normal limits.  Your chest x-ray shows evidence of bronchitis.  There is no evidence of pneumonia or other emergent problems.  Please use 2 puffs of albuterol every 4 hours.  Please use Decadron and doxycycline 2 times daily with a meal.  Please use Zofran every 6 hours if needed for nausea.  Please see Dr. Berdine Addison for follow-up in the office.
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
## 2018-06-17 ENCOUNTER — Other Ambulatory Visit: Payer: Medicaid Other | Admitting: Adult Health

## 2018-07-01 ENCOUNTER — Other Ambulatory Visit: Payer: Medicaid Other | Admitting: Adult Health

## 2018-07-06 ENCOUNTER — Ambulatory Visit (HOSPITAL_COMMUNITY): Payer: Medicaid Other

## 2018-07-22 ENCOUNTER — Encounter (HOSPITAL_COMMUNITY): Payer: Self-pay

## 2018-07-22 ENCOUNTER — Ambulatory Visit (HOSPITAL_COMMUNITY)
Admission: RE | Admit: 2018-07-22 | Discharge: 2018-07-22 | Disposition: A | Discharge status: Home / Self Care | Payer: Medicaid Other | Source: Ambulatory Visit | Attending: Family Medicine | Admitting: Family Medicine

## 2018-07-22 DIAGNOSIS — Z1231 Encounter for screening mammogram for malignant neoplasm of breast: Secondary | ICD-10-CM | POA: Diagnosis present

## 2018-07-23 ENCOUNTER — Ambulatory Visit (HOSPITAL_COMMUNITY): Payer: Medicaid Other

## 2018-07-25 ENCOUNTER — Other Ambulatory Visit: Payer: Self-pay

## 2018-07-25 ENCOUNTER — Encounter (HOSPITAL_COMMUNITY): Payer: Self-pay | Admitting: Emergency Medicine

## 2018-07-25 ENCOUNTER — Emergency Department (HOSPITAL_COMMUNITY): Payer: Medicaid Other

## 2018-07-25 ENCOUNTER — Emergency Department (HOSPITAL_COMMUNITY)
Admission: EM | Admit: 2018-07-25 | Discharge: 2018-07-25 | Disposition: A | Discharge status: Home / Self Care | Payer: Medicaid Other | Attending: Emergency Medicine | Admitting: Emergency Medicine

## 2018-07-25 DIAGNOSIS — I10 Essential (primary) hypertension: Secondary | ICD-10-CM | POA: Diagnosis not present

## 2018-07-25 DIAGNOSIS — M545 Low back pain, unspecified: Secondary | ICD-10-CM

## 2018-07-25 DIAGNOSIS — Z79899 Other long term (current) drug therapy: Secondary | ICD-10-CM | POA: Insufficient documentation

## 2018-07-25 DIAGNOSIS — Z96642 Presence of left artificial hip joint: Secondary | ICD-10-CM | POA: Diagnosis not present

## 2018-07-25 DIAGNOSIS — R109 Unspecified abdominal pain: Secondary | ICD-10-CM | POA: Diagnosis present

## 2018-07-25 LAB — COMPREHENSIVE METABOLIC PANEL
ALBUMIN: 3.7 g/dL (ref 3.5–5.0)
ALK PHOS: 66 U/L (ref 38–126)
ALT: 13 U/L (ref 0–44)
AST: 20 U/L (ref 15–41)
Anion gap: 4 — ABNORMAL LOW (ref 5–15)
BILIRUBIN TOTAL: 0.8 mg/dL (ref 0.3–1.2)
BUN: 16 mg/dL (ref 8–23)
CALCIUM: 9.1 mg/dL (ref 8.9–10.3)
CO2: 28 mmol/L (ref 22–32)
CREATININE: 0.66 mg/dL (ref 0.44–1.00)
Chloride: 106 mmol/L (ref 98–111)
GFR calc non Af Amer: >60 mL/min (ref >60)
GLUCOSE: 118 mg/dL — AB (ref 70–99)
Potassium: 3.4 mmol/L — ABNORMAL LOW (ref 3.5–5.1)
Sodium: 138 mmol/L (ref 135–145)
TOTAL PROTEIN: 7.3 g/dL (ref 6.5–8.1)

## 2018-07-25 LAB — URINALYSIS, ROUTINE W REFLEX MICROSCOPIC
BILIRUBIN URINE: NEGATIVE
Bacteria, UA: NONE SEEN
Glucose, UA: NEGATIVE mg/dL
HGB URINE DIPSTICK: NEGATIVE
Ketones, ur: 20 mg/dL — AB
Leukocytes, UA: NEGATIVE
NITRITE: NEGATIVE
PROTEIN: 100 mg/dL — AB
Specific Gravity, Urine: 1.019 (ref 1.005–1.030)
pH: 5 (ref 5.0–8.0)

## 2018-07-25 LAB — CBC
HEMATOCRIT: 38.1 % (ref 36.0–46.0)
HEMOGLOBIN: 12.8 g/dL (ref 12.0–15.0)
MCH: 30.7 pg (ref 26.0–34.0)
MCHC: 33.6 g/dL (ref 30.0–36.0)
MCV: 91.4 fL (ref 78.0–100.0)
Platelets: 267 10*3/uL (ref 150–400)
RBC: 4.17 MIL/uL (ref 3.87–5.11)
RDW: 12.9 % (ref 11.5–15.5)
WBC: 8.6 10*3/uL (ref 4.0–10.5)

## 2018-07-25 MED ORDER — HYDROMORPHONE HCL 1 MG/ML IJ SOLN
1.0000 mg | Freq: Once | INTRAMUSCULAR | Status: AC
Start: 2018-07-25 — End: 2018-07-25
  Administered 2018-07-25: 1 mg via INTRAVENOUS
  Filled 2018-07-25: qty 1

## 2018-07-25 MED ORDER — METHOCARBAMOL 500 MG PO TABS: 500.0000 mg | ORAL_TABLET | Freq: Three times a day (TID) | ORAL | 0 refills | Status: DC | PRN

## 2018-07-25 MED ORDER — HYDROCODONE-ACETAMINOPHEN 5-325 MG PO TABS: 1.0000 | ORAL_TABLET | Freq: Four times a day (QID) | ORAL | 0 refills | Status: DC | PRN

## 2018-07-25 MED ORDER — ONDANSETRON HCL 4 MG/2ML IJ SOLN
4.0000 mg | Freq: Once | INTRAMUSCULAR | Status: AC
  Administered 2018-07-25: 4 mg via INTRAVENOUS
  Filled 2018-07-25: qty 2

## 2018-07-25 MED ORDER — MORPHINE SULFATE (PF) 4 MG/ML IV SOLN
4.0000 mg | Freq: Once | INTRAVENOUS | Status: AC
  Administered 2018-07-25: 4 mg via INTRAVENOUS
  Filled 2018-07-25: qty 1

## 2018-07-25 MED ORDER — METHOCARBAMOL 500 MG PO TABS
1000.0000 mg | ORAL_TABLET | Freq: Once | ORAL | Status: AC
  Administered 2018-07-25: 1000 mg via ORAL
  Filled 2018-07-25: qty 2

## 2018-07-25 MED ORDER — KETOROLAC TROMETHAMINE 30 MG/ML IJ SOLN
30.0000 mg | Freq: Once | INTRAMUSCULAR | Status: AC
  Administered 2018-07-25: 30 mg via INTRAVENOUS
  Filled 2018-07-25: qty 1
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
## 2018-07-25 NOTE — ED Triage Notes (Signed)
Patient c/o left flank pain that woke her this morning. Per patient nausea and diarrhea. Denies any diarrhea, fevers, or urinary symptoms. Per patient worse with movement.

## 2018-07-25 NOTE — ED Notes (Signed)
C/o nausea when lying on back. Pt has OTC pain patches to left mid back.

## 2018-07-28 ENCOUNTER — Other Ambulatory Visit (HOSPITAL_COMMUNITY): Payer: Self-pay | Admitting: Family Medicine

## 2018-07-28 DIAGNOSIS — R928 Other abnormal and inconclusive findings on diagnostic imaging of breast: Secondary | ICD-10-CM

## 2018-08-04 ENCOUNTER — Other Ambulatory Visit: Payer: Self-pay

## 2018-08-04 ENCOUNTER — Ambulatory Visit: Payer: Medicaid Other | Admitting: Adult Health

## 2018-08-04 ENCOUNTER — Encounter: Payer: Self-pay | Admitting: Adult Health

## 2018-08-04 VITALS — BP 127/65 | HR 62 | Ht 61.0 in | Wt 215.0 lb

## 2018-08-04 DIAGNOSIS — K5903 Drug induced constipation: Secondary | ICD-10-CM | POA: Diagnosis not present

## 2018-08-04 DIAGNOSIS — Z1212 Encounter for screening for malignant neoplasm of rectum: Secondary | ICD-10-CM

## 2018-08-04 DIAGNOSIS — Z01419 Encounter for gynecological examination (general) (routine) without abnormal findings: Secondary | ICD-10-CM

## 2018-08-04 DIAGNOSIS — Z0000 Encounter for general adult medical examination without abnormal findings: Secondary | ICD-10-CM

## 2018-08-04 DIAGNOSIS — Z1211 Encounter for screening for malignant neoplasm of colon: Secondary | ICD-10-CM | POA: Insufficient documentation

## 2018-08-04 DIAGNOSIS — T402X5A Adverse effect of other opioids, initial encounter: Secondary | ICD-10-CM | POA: Diagnosis not present

## 2018-08-04 LAB — HEMOCCULT GUIAC POC 1CARD (OFFICE): Fecal Occult Blood, POC: NEGATIVE

## 2018-08-04 MED ORDER — NALOXEGOL OXALATE 25 MG PO TABS: 25.0000 mg | ORAL_TABLET | Freq: Every day | ORAL | 6 refills | Status: DC

## 2018-08-04 NOTE — Progress Notes (Signed)
Patient ID: Rachel [name], female   DOB: 10/05/1956, 62 y.o.   MRN: [id number] History of Present Illness: Mikea is a 62 year old black female, PM in for a well woman gyn exam, she had a normal pap with negative HPV 05/27/16. PCP is Dr Berdine Addison.   Current Medications, Allergies, Past Medical History, Past Surgical History, Family History and Social History were reviewed in [employer] record.     Review of Systems: Patient denies any headaches, hearing loss, fatigue, blurred vision, shortness of breath, chest pain, abdominal pain, problems with bowel movements,(amitiza not helping as much) urination, or intercourse(not having sex). No  mood swings. Has knee pain and is being seen in [place] now.     Physical Exam:BP 127/65 (BP Location: Left Arm, Patient Position: Sitting, Cuff Size: Normal)   Pulse 62   Ht 5\' 1"  (1.549 m)   Wt 215 lb (97.5 kg)   BMI 40.62 kg/m Has lost 3 lbs since last year. General:  Well developed, well nourished, no acute distress Skin:  Warm and dry Neck:  Midline trachea, normal thyroid, good ROM, no lymphadenopathy Lungs; Clear to auscultation bilaterally Breast:  No dominant palpable mass, retraction, or nipple discharge Cardiovascular: Regular rate and rhythm Abdomen:  Soft, non tender, no hepatosplenomegaly Pelvic:  External genitalia is normal in appearance, no lesions.  The vagina is pale with loss of moisture and rugae.Marland Kitchen Urethra has no lesions or masses. The cervix is smooth.  Uterus is felt to be normal size, shape, and contour.  No adnexal masses or tenderness noted.Bladder is non tender, no masses felt. Rectal: Good sphincter tone, no polyps, or hemorrhoids felt.  Hemoccult negative. Extremities/musculoskeletal:  No swelling or varicosities noted, no clubbing or cyanosis Psych:  No mood changes, alert and cooperative,seems happy PHQ 2 score  0. Examination chaperoned by Estill Bamberg Rash LPN  Impression: 1. Well woman exam with  routine gynecological exam   2. Screening for colorectal cancer   3. Constipation due to opioid therapy       Plan: Pap and physical  Get mammogram  Labs with Dr Berdine Addison Colonoscopy 2025  Meds ordered this encounter  Medications  . naloxegol oxalate (MOVANTIK) 25 MG TABS tablet    Sig: Take 1 tablet (25 mg total) by mouth daily.    Dispense:  30 tablet    Refill:  6    Order Specific Question:   Supervising Provider    Answer:   Florian Buff [[id number]]   Get flu shot this year

## 2018-08-06 ENCOUNTER — Emergency Department (HOSPITAL_COMMUNITY): Payer: Medicaid Other

## 2018-08-06 ENCOUNTER — Other Ambulatory Visit: Payer: Self-pay

## 2018-08-06 ENCOUNTER — Encounter (HOSPITAL_COMMUNITY): Payer: Self-pay

## 2018-08-06 ENCOUNTER — Emergency Department (HOSPITAL_COMMUNITY)
Admission: EM | Admit: 2018-08-06 | Discharge: 2018-08-06 | Disposition: A | Discharge status: Home / Self Care | Payer: Medicaid Other | Attending: Emergency Medicine | Admitting: Emergency Medicine

## 2018-08-06 DIAGNOSIS — S39012A Strain of muscle, fascia and tendon of lower back, initial encounter: Secondary | ICD-10-CM | POA: Diagnosis not present

## 2018-08-06 DIAGNOSIS — Y999 Unspecified external cause status: Secondary | ICD-10-CM | POA: Diagnosis not present

## 2018-08-06 DIAGNOSIS — X58XXXA Exposure to other specified factors, initial encounter: Secondary | ICD-10-CM | POA: Diagnosis not present

## 2018-08-06 DIAGNOSIS — I10 Essential (primary) hypertension: Secondary | ICD-10-CM | POA: Diagnosis not present

## 2018-08-06 DIAGNOSIS — Z79899 Other long term (current) drug therapy: Secondary | ICD-10-CM | POA: Diagnosis not present

## 2018-08-06 DIAGNOSIS — Y939 Activity, unspecified: Secondary | ICD-10-CM | POA: Diagnosis not present

## 2018-08-06 DIAGNOSIS — R1032 Left lower quadrant pain: Secondary | ICD-10-CM | POA: Diagnosis present

## 2018-08-06 DIAGNOSIS — Y929 Unspecified place or not applicable: Secondary | ICD-10-CM | POA: Diagnosis not present

## 2018-08-06 LAB — BASIC METABOLIC PANEL
Anion gap: 7 (ref 5–15)
BUN: 13 mg/dL (ref 8–23)
CALCIUM: 8.6 mg/dL — AB (ref 8.9–10.3)
CO2: 24 mmol/L (ref 22–32)
Chloride: 106 mmol/L (ref 98–111)
Creatinine, Ser: 0.75 mg/dL (ref 0.44–1.00)
GFR calc Af Amer: >60 mL/min (ref >60)
GFR calc non Af Amer: >60 mL/min (ref >60)
GLUCOSE: 101 mg/dL — AB (ref 70–99)
Potassium: 3.8 mmol/L (ref 3.5–5.1)
Sodium: 137 mmol/L (ref 135–145)

## 2018-08-06 LAB — URINALYSIS, ROUTINE W REFLEX MICROSCOPIC
BILIRUBIN URINE: NEGATIVE
Glucose, UA: NEGATIVE mg/dL
Hgb urine dipstick: NEGATIVE
KETONES UR: NEGATIVE mg/dL
Nitrite: NEGATIVE
PH: 6.5 (ref 5.0–8.0)
Protein, ur: NEGATIVE mg/dL
Specific Gravity, Urine: 1.015 (ref 1.005–1.030)

## 2018-08-06 LAB — CBC WITH DIFFERENTIAL/PLATELET
Abs Immature Granulocytes: 0.02 10*3/uL (ref 0.00–0.07)
BASOS PCT: 1 %
Basophils Absolute: 0 10*3/uL (ref 0.0–0.1)
EOS ABS: 0.2 10*3/uL (ref 0.0–0.5)
EOS PCT: 2 %
HEMATOCRIT: 35.8 % — AB (ref 36.0–46.0)
Hemoglobin: 11.8 g/dL — ABNORMAL LOW (ref 12.0–15.0)
Immature Granulocytes: 0 %
LYMPHS ABS: 1.7 10*3/uL (ref 0.7–4.0)
Lymphocytes Relative: 25 %
MCH: 30 pg (ref 26.0–34.0)
MCHC: 33 g/dL (ref 30.0–36.0)
MCV: 91.1 fL (ref 80.0–100.0)
MONOS PCT: 12 %
Monocytes Absolute: 0.8 10*3/uL (ref 0.1–1.0)
NEUTROS PCT: 60 %
Neutro Abs: 3.9 10*3/uL (ref 1.7–7.7)
PLATELETS: 273 10*3/uL (ref 150–400)
RBC: 3.93 MIL/uL (ref 3.87–5.11)
RDW: 12.7 % (ref 11.5–15.5)
WBC: 6.6 10*3/uL (ref 4.0–10.5)
nRBC: 0 % (ref 0.0–0.2)

## 2018-08-06 LAB — URINALYSIS, MICROSCOPIC (REFLEX): RBC / HPF: NONE SEEN RBC/hpf (ref 0–5)

## 2018-08-06 MED ORDER — ONDANSETRON HCL 4 MG/2ML IJ SOLN
4.0000 mg | Freq: Once | INTRAMUSCULAR | Status: AC
  Administered 2018-08-06: 4 mg via INTRAVENOUS
  Filled 2018-08-06: qty 2

## 2018-08-06 MED ORDER — SODIUM CHLORIDE 0.9 % IV BOLUS (SEPSIS)
1000.0000 mL | Freq: Once | INTRAVENOUS | Status: AC
  Administered 2018-08-06: 1000 mL via INTRAVENOUS

## 2018-08-06 MED ORDER — HYDROMORPHONE HCL 1 MG/ML IJ SOLN
1.0000 mg | Freq: Once | INTRAMUSCULAR | Status: AC
  Administered 2018-08-06: 1 mg via INTRAVENOUS
  Filled 2018-08-06: qty 1

## 2018-08-06 MED ORDER — KETOROLAC TROMETHAMINE 30 MG/ML IJ SOLN
30.0000 mg | Freq: Once | INTRAMUSCULAR | Status: AC
  Administered 2018-08-06: 30 mg via INTRAVENOUS
  Filled 2018-08-06: qty 1

## 2018-08-06 MED ORDER — FENTANYL CITRATE (PF) 100 MCG/2ML IJ SOLN
100.0000 ug | Freq: Once | INTRAMUSCULAR | Status: AC
  Administered 2018-08-06: 100 ug via INTRAVENOUS
  Filled 2018-08-06: qty 2

## 2018-08-06 MED ORDER — TRAMADOL HCL 50 MG PO TABS: 50.0000 mg | ORAL_TABLET | Freq: Four times a day (QID) | ORAL | 0 refills | Status: DC | PRN
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
## 2018-08-06 NOTE — ED Provider Notes (Signed)
Patient with left flank pain.  Ultrasound of kidneys unremarkable.  Labs including urine unremarkable.  Patient no longer has any pain medicine at home she will be given some Ultram and will follow-up with her PCP and urology   Milton Ferguson, MD 08/06/18 1020

## 2018-08-06 NOTE — ED Triage Notes (Signed)
Complaints of back pain that radiates around to abdomen. Patient states was here 28th for same problems. Denies diarrhea or urinary symptoms, no fever.

## 2018-08-06 NOTE — ED Notes (Signed)
Pt understands we need a urine sample. Unable to provide sample at this time. Call light at bedside

## 2018-08-06 NOTE — Discharge Instructions (Signed)
Follow-up with your family doctor next week and also make an appointment to be seen by the urologist at [facility] urology

## 2018-08-09 ENCOUNTER — Emergency Department (HOSPITAL_COMMUNITY)
Admission: EM | Admit: 2018-08-09 | Discharge: 2018-08-09 | Disposition: A | Discharge status: Home / Self Care | Payer: Medicaid Other | Attending: Emergency Medicine | Admitting: Emergency Medicine

## 2018-08-09 ENCOUNTER — Encounter (HOSPITAL_COMMUNITY): Payer: Self-pay | Admitting: *Deleted

## 2018-08-09 ENCOUNTER — Emergency Department (HOSPITAL_COMMUNITY): Payer: Medicaid Other

## 2018-08-09 ENCOUNTER — Other Ambulatory Visit: Payer: Self-pay

## 2018-08-09 DIAGNOSIS — R1084 Generalized abdominal pain: Secondary | ICD-10-CM | POA: Insufficient documentation

## 2018-08-09 DIAGNOSIS — I10 Essential (primary) hypertension: Secondary | ICD-10-CM | POA: Diagnosis not present

## 2018-08-09 DIAGNOSIS — R109 Unspecified abdominal pain: Secondary | ICD-10-CM

## 2018-08-09 DIAGNOSIS — Z79899 Other long term (current) drug therapy: Secondary | ICD-10-CM | POA: Insufficient documentation

## 2018-08-09 DIAGNOSIS — E785 Hyperlipidemia, unspecified: Secondary | ICD-10-CM | POA: Insufficient documentation

## 2018-08-09 LAB — COMPREHENSIVE METABOLIC PANEL
ALK PHOS: 57 U/L (ref 38–126)
ALT: 12 U/L (ref 0–44)
AST: 19 U/L (ref 15–41)
Albumin: 3.8 g/dL (ref 3.5–5.0)
Anion gap: 8 (ref 5–15)
BILIRUBIN TOTAL: 0.9 mg/dL (ref 0.3–1.2)
BUN: 16 mg/dL (ref 8–23)
CALCIUM: 8.9 mg/dL (ref 8.9–10.3)
CO2: 24 mmol/L (ref 22–32)
Chloride: 105 mmol/L (ref 98–111)
Creatinine, Ser: 0.76 mg/dL (ref 0.44–1.00)
GFR calc Af Amer: >60 mL/min (ref >60)
GFR calc non Af Amer: >60 mL/min (ref >60)
Glucose, Bld: 111 mg/dL — ABNORMAL HIGH (ref 70–99)
POTASSIUM: 3.3 mmol/L — AB (ref 3.5–5.1)
SODIUM: 137 mmol/L (ref 135–145)
TOTAL PROTEIN: 7 g/dL (ref 6.5–8.1)

## 2018-08-09 LAB — CBC WITH DIFFERENTIAL/PLATELET
Abs Immature Granulocytes: 0.02 10*3/uL (ref 0.00–0.07)
BASOS ABS: 0 10*3/uL (ref 0.0–0.1)
Basophils Relative: 1 %
EOS ABS: 0.2 10*3/uL (ref 0.0–0.5)
Eosinophils Relative: 3 %
HEMATOCRIT: 36.6 % (ref 36.0–46.0)
HEMOGLOBIN: 12.2 g/dL (ref 12.0–15.0)
IMMATURE GRANULOCYTES: 0 %
LYMPHS ABS: 1.8 10*3/uL (ref 0.7–4.0)
LYMPHS PCT: 28 %
MCH: 30.7 pg (ref 26.0–34.0)
MCHC: 33.3 g/dL (ref 30.0–36.0)
MCV: 92 fL (ref 80.0–100.0)
Monocytes Absolute: 0.8 10*3/uL (ref 0.1–1.0)
Monocytes Relative: 13 %
NEUTROS PCT: 55 %
NRBC: 0 % (ref 0.0–0.2)
Neutro Abs: 3.5 10*3/uL (ref 1.7–7.7)
Platelets: 239 10*3/uL (ref 150–400)
RBC: 3.98 MIL/uL (ref 3.87–5.11)
RDW: 12.8 % (ref 11.5–15.5)
WBC: 6.4 10*3/uL (ref 4.0–10.5)

## 2018-08-09 LAB — URINALYSIS, ROUTINE W REFLEX MICROSCOPIC
Bilirubin Urine: NEGATIVE
GLUCOSE, UA: NEGATIVE mg/dL
HGB URINE DIPSTICK: NEGATIVE
Ketones, ur: NEGATIVE mg/dL
Nitrite: NEGATIVE
PH: 6 (ref 5.0–8.0)
Protein, ur: NEGATIVE mg/dL
Specific Gravity, Urine: 1.008 (ref 1.005–1.030)

## 2018-08-09 MED ORDER — ONDANSETRON HCL 4 MG/2ML IJ SOLN
4.0000 mg | Freq: Once | INTRAMUSCULAR | Status: AC
  Administered 2018-08-09: 4 mg via INTRAVENOUS
  Filled 2018-08-09: qty 2

## 2018-08-09 MED ORDER — MORPHINE SULFATE (PF) 4 MG/ML IV SOLN
4.0000 mg | Freq: Once | INTRAVENOUS | Status: AC
  Administered 2018-08-09: 4 mg via INTRAVENOUS
  Filled 2018-08-09: qty 1

## 2018-08-09 MED ORDER — OXYCODONE-ACETAMINOPHEN 5-325 MG PO TABS: 1.0000 | ORAL_TABLET | ORAL | 0 refills | Status: DC | PRN

## 2018-08-09 MED ORDER — FENTANYL CITRATE (PF) 100 MCG/2ML IJ SOLN
50.0000 ug | Freq: Once | INTRAMUSCULAR | Status: AC
  Administered 2018-08-09: 50 ug via INTRAVENOUS
  Filled 2018-08-09: qty 2

## 2018-08-09 MED ORDER — KETOROLAC TROMETHAMINE 30 MG/ML IJ SOLN
30.0000 mg | Freq: Once | INTRAMUSCULAR | Status: AC
  Administered 2018-08-09: 30 mg via INTRAVENOUS
  Filled 2018-08-09: qty 1

## 2018-08-09 MED ORDER — ONDANSETRON HCL 4 MG PO TABS: 4.0000 mg | ORAL_TABLET | Freq: Four times a day (QID) | ORAL | 0 refills | Status: DC

## 2018-08-09 MED ORDER — SODIUM CHLORIDE 0.9 % IV BOLUS
1000.0000 mL | Freq: Once | INTRAVENOUS | Status: AC
  Administered 2018-08-09: 1000 mL via INTRAVENOUS
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
## 2018-08-09 NOTE — ED Triage Notes (Addendum)
Pt brought in by [facility] with c/o left lower back that radiates around to abdomen x 3 weeks. This is pt's 3rd visit to ED since it started. EMS gave Toradol 30mg  IV at 0700. Denies n/v, urinary symptoms. Pt reports diarrhea on Friday, but none since.

## 2018-08-09 NOTE — Discharge Instructions (Addendum)
Tests showed no life-threatening condition.  Medication for pain and nausea.  Follow-up with your primary care doctor early in the week.

## 2018-08-11 ENCOUNTER — Encounter (HOSPITAL_COMMUNITY): Payer: Self-pay

## 2018-08-11 ENCOUNTER — Ambulatory Visit (HOSPITAL_COMMUNITY)
Admission: RE | Admit: 2018-08-11 | Discharge: 2018-08-11 | Disposition: A | Discharge status: Home / Self Care | Payer: Medicaid Other | Source: Ambulatory Visit | Attending: Family Medicine | Admitting: Family Medicine

## 2018-08-11 DIAGNOSIS — R928 Other abnormal and inconclusive findings on diagnostic imaging of breast: Secondary | ICD-10-CM | POA: Diagnosis present

## 2019-02-08 ENCOUNTER — Emergency Department (HOSPITAL_COMMUNITY): Payer: Medicaid Other

## 2019-02-08 ENCOUNTER — Emergency Department (HOSPITAL_COMMUNITY)
Admission: EM | Admit: 2019-02-08 | Discharge: 2019-02-08 | Disposition: A | Discharge status: Home / Self Care | Payer: Medicaid Other | Attending: Emergency Medicine | Admitting: Emergency Medicine

## 2019-02-08 ENCOUNTER — Telehealth: Payer: Self-pay | Admitting: Orthopedic Surgery

## 2019-02-08 ENCOUNTER — Encounter (HOSPITAL_COMMUNITY): Payer: Self-pay | Admitting: Emergency Medicine

## 2019-02-08 ENCOUNTER — Other Ambulatory Visit: Payer: Self-pay

## 2019-02-08 DIAGNOSIS — I10 Essential (primary) hypertension: Secondary | ICD-10-CM | POA: Insufficient documentation

## 2019-02-08 DIAGNOSIS — R42 Dizziness and giddiness: Secondary | ICD-10-CM

## 2019-02-08 LAB — CBC
HCT: 37.2 % (ref 36.0–46.0)
Hemoglobin: 12.2 g/dL (ref 12.0–15.0)
MCH: 30.2 pg (ref 26.0–34.0)
MCHC: 32.8 g/dL (ref 30.0–36.0)
MCV: 92.1 fL (ref 80.0–100.0)
Platelets: 234 10*3/uL (ref 150–400)
RBC: 4.04 MIL/uL (ref 3.87–5.11)
RDW: 12.2 % (ref 11.5–15.5)
WBC: 4.7 10*3/uL (ref 4.0–10.5)
nRBC: 0 % (ref 0.0–0.2)

## 2019-02-08 LAB — BASIC METABOLIC PANEL
Anion gap: 8 (ref 5–15)
BUN: 17 mg/dL (ref 8–23)
CO2: 24 mmol/L (ref 22–32)
Calcium: 9.3 mg/dL (ref 8.9–10.3)
Chloride: 105 mmol/L (ref 98–111)
Creatinine, Ser: 0.75 mg/dL (ref 0.44–1.00)
GFR calc Af Amer: >60 mL/min (ref >60)
GFR calc non Af Amer: >60 mL/min (ref >60)
Glucose, Bld: 117 mg/dL — ABNORMAL HIGH (ref 70–99)
Potassium: 3.5 mmol/L (ref 3.5–5.1)
Sodium: 137 mmol/L (ref 135–145)

## 2019-02-08 LAB — URINALYSIS, ROUTINE W REFLEX MICROSCOPIC
Bilirubin Urine: NEGATIVE
Glucose, UA: NEGATIVE mg/dL
Hgb urine dipstick: NEGATIVE
Ketones, ur: NEGATIVE mg/dL
Leukocytes,Ua: NEGATIVE
Nitrite: NEGATIVE
Protein, ur: NEGATIVE mg/dL
Specific Gravity, Urine: 1.015 (ref 1.005–1.030)
pH: 5 (ref 5.0–8.0)

## 2019-02-08 LAB — TROPONIN I: Troponin I: <0.03 ng/mL (ref <0.03)

## 2019-02-08 MED ORDER — MECLIZINE HCL 25 MG PO TABS: 25.0000 mg | ORAL_TABLET | Freq: Three times a day (TID) | ORAL | 0 refills | Status: DC | PRN

## 2019-02-08 MED ORDER — SODIUM CHLORIDE 0.9 % IV BOLUS
1000.0000 mL | Freq: Once | INTRAVENOUS | Status: AC
  Administered 2019-02-08: 1000 mL via INTRAVENOUS

## 2019-02-08 MED ORDER — MECLIZINE HCL 12.5 MG PO TABS
50.0000 mg | ORAL_TABLET | Freq: Once | ORAL | Status: AC
  Administered 2019-02-08: 15:00:00 50 mg via ORAL
  Filled 2019-02-08: qty 4

## 2019-02-08 MED ORDER — LORAZEPAM 2 MG/ML IJ SOLN
1.0000 mg | Freq: Once | INTRAMUSCULAR | Status: AC
  Administered 2019-02-08: 1 mg via INTRAVENOUS
  Filled 2019-02-08: qty 1

## 2019-02-08 MED ORDER — HYDROCODONE-ACETAMINOPHEN 5-325 MG PO TABS
2.0000 | ORAL_TABLET | Freq: Once | ORAL | Status: AC
  Administered 2019-02-08: 17:00:00 2 via ORAL
  Filled 2019-02-08: qty 2

## 2019-02-08 NOTE — ED Provider Notes (Signed)
  Physical Exam  BP (!) 96/58   Pulse (!) 59   Temp 98.2 F (36.8 C) (Oral)   Resp 20   Ht 5' (1.524 m)   Wt 89.8 kg   SpO2 100%   BMI 38.67 kg/m   Physical Exam  ED Course/Procedures     Procedures  MDM  Patient with vertigo.  MRI done due to rule out central cause of her vertigo.  MRI reassuring.  Feels better after treatment.  Will discharge with outpatient follow-up.       Davonna Belling, MD 02/08/19 [phone]

## 2019-02-08 NOTE — ED Notes (Signed)
Pt ambulated well. Pt states she felt better than she did before.

## 2019-02-08 NOTE — ED Notes (Signed)
Pt transported to MRI 

## 2019-02-08 NOTE — ED Notes (Signed)
Patient transported to CT 
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
## 2019-02-08 NOTE — ED Triage Notes (Signed)
Pt states that she has been dizzy since Thursday

## 2019-02-08 NOTE — Telephone Encounter (Signed)
Patient called to relay she has had pain in both shoulders (recurring); inquiring about scheduling appointment with Dr Aline Brochure. Patient made aware that, due to Covid-19, we are scheduling in 4 to 6 weeks - voiced understanding.  Also discussed that updated referral is needed from primary care, Dr Berdine Addison. Patient will request referral and aware we will call her back when received.

## 2019-02-10 ENCOUNTER — Other Ambulatory Visit: Payer: Self-pay | Admitting: Sports Medicine

## 2019-03-17 ENCOUNTER — Encounter: Payer: Self-pay | Admitting: Orthopedic Surgery

## 2019-03-17 ENCOUNTER — Ambulatory Visit: Payer: Medicaid Other | Admitting: Orthopedic Surgery

## 2019-03-17 ENCOUNTER — Other Ambulatory Visit: Payer: Self-pay

## 2019-03-17 VITALS — BP 133/71 | HR 70 | Ht 60.0 in | Wt 201.0 lb

## 2019-03-17 DIAGNOSIS — M25512 Pain in left shoulder: Secondary | ICD-10-CM | POA: Diagnosis not present

## 2019-03-17 DIAGNOSIS — M25511 Pain in right shoulder: Secondary | ICD-10-CM

## 2019-03-17 NOTE — Patient Instructions (Signed)

## 2019-03-17 NOTE — Progress Notes (Signed)
Chief Complaint  Patient presents with  . Shoulder Pain    Bilateral and wants injections   Encounter Diagnosis  Name Primary?  . Pain of both shoulder joints Yes    BP 133/71   Pulse 70   Ht 5' (1.524 m)   Wt 201 lb (91.2 kg)   BMI 39.26 kg/m    Procedure injection right shoulder subacromial joint and left shoulder subacromial joint   procedure note the subacromial injection shoulder RIGHT  Verbal consent was obtained to inject the  RIGHT   Shoulder  Timeout was completed to confirm the injection site is a subacromial space of the  RIGHT  shoulder   Medication used Depo-Medrol 40 mg and lidocaine 1% 3 cc  Anesthesia was provided by ethyl chloride  The injection was performed in the RIGHT  posterior subacromial space. After pinning the skin with alcohol and anesthetized the skin with ethyl chloride the subacromial space was injected using a 20-gauge needle. There were no complications  Sterile dressing was applied.    Procedure note the subacromial injection shoulder left   Verbal consent was obtained to inject the  Left   Shoulder  Timeout was completed to confirm the injection site is a subacromial space of the  left  shoulder  Medication used Depo-Medrol 40 mg and lidocaine 1% 3 cc  Anesthesia was provided by ethyl chloride  The injection was performed in the left  posterior subacromial space. After pinning the skin with alcohol and anesthetized the skin with ethyl chloride the subacromial space was injected using a 20-gauge needle. There were no complications  Sterile dressing was applied.

## 2019-08-06 ENCOUNTER — Other Ambulatory Visit (HOSPITAL_COMMUNITY): Payer: Self-pay | Admitting: Family Medicine

## 2019-08-06 DIAGNOSIS — Z1231 Encounter for screening mammogram for malignant neoplasm of breast: Secondary | ICD-10-CM

## 2019-08-19 ENCOUNTER — Ambulatory Visit (HOSPITAL_COMMUNITY)
Admission: RE | Admit: 2019-08-19 | Discharge: 2019-08-19 | Disposition: A | Discharge status: Home / Self Care | Payer: Medicaid Other | Source: Ambulatory Visit | Attending: Family Medicine | Admitting: Family Medicine

## 2019-08-19 ENCOUNTER — Other Ambulatory Visit: Payer: Self-pay

## 2019-08-19 DIAGNOSIS — Z1231 Encounter for screening mammogram for malignant neoplasm of breast: Secondary | ICD-10-CM | POA: Diagnosis not present

## 2019-11-12 ENCOUNTER — Other Ambulatory Visit: Payer: Self-pay

## 2019-11-12 ENCOUNTER — Ambulatory Visit (INDEPENDENT_AMBULATORY_CARE_PROVIDER_SITE_OTHER): Payer: Medicare Other | Admitting: Orthopedic Surgery

## 2019-11-12 VITALS — BP 147/80 | HR 85 | Temp 97.2°F | Ht 61.0 in | Wt 201.0 lb

## 2019-11-12 DIAGNOSIS — M25512 Pain in left shoulder: Secondary | ICD-10-CM

## 2019-11-12 DIAGNOSIS — M25511 Pain in right shoulder: Secondary | ICD-10-CM | POA: Diagnosis not present

## 2019-11-12 NOTE — Progress Notes (Signed)
Chief Complaint  Patient presents with  . Follow-up    Recheck on bilateral shoulders.    Procedure injection right shoulder subacromial joint and left shoulder subacromial joint   procedure note the subacromial injection shoulder RIGHT  Verbal consent was obtained to inject the  RIGHT   Shoulder  Timeout was completed to confirm the injection site is a subacromial space of the  RIGHT  shoulder   Medication used Depo-Medrol 40 mg and lidocaine 1% 3 cc  Anesthesia was provided by ethyl chloride  The injection was performed in the RIGHT  posterior subacromial space. After pinning the skin with alcohol and anesthetized the skin with ethyl chloride the subacromial space was injected using a 20-gauge needle. There were no complications  Sterile dressing was applied.    Procedure note the subacromial injection shoulder left   Verbal consent was obtained to inject the  Left   Shoulder  Timeout was completed to confirm the injection site is a subacromial space of the  left  shoulder  Medication used Depo-Medrol 40 mg and lidocaine 1% 3 cc  Anesthesia was provided by ethyl chloride  The injection was performed in the left  posterior subacromial space. After pinning the skin with alcohol and anesthetized the skin with ethyl chloride the subacromial space was injected using a 20-gauge needle. There were no complications  Sterile dressing was applied.

## 2019-11-12 NOTE — Patient Instructions (Signed)

## 2020-02-09 ENCOUNTER — Ambulatory Visit (INDEPENDENT_AMBULATORY_CARE_PROVIDER_SITE_OTHER): Payer: Medicare Other | Admitting: Orthopedic Surgery

## 2020-02-09 ENCOUNTER — Other Ambulatory Visit: Payer: Self-pay

## 2020-02-09 VITALS — BP 123/60 | HR 74 | Temp 97.4°F | Ht 60.0 in | Wt 215.0 lb

## 2020-02-09 DIAGNOSIS — M25512 Pain in left shoulder: Secondary | ICD-10-CM | POA: Diagnosis not present

## 2020-02-09 DIAGNOSIS — M25511 Pain in right shoulder: Secondary | ICD-10-CM | POA: Diagnosis not present

## 2020-02-09 NOTE — Progress Notes (Signed)
Chief Complaint  Patient presents with  . Follow-up    Recheck on bilateral shoulder injections.    Encounter Diagnosis  Name Primary?  . Pain of both shoulder joints Yes    Procedure injection right shoulder subacromial joint and left shoulder subacromial joint   procedure note the subacromial injection shoulder RIGHT  Verbal consent was obtained to inject the  RIGHT   Shoulder  Timeout was completed to confirm the injection site is a subacromial space of the  RIGHT  shoulder   Medication used Depo-Medrol 40 mg and lidocaine 1% 3 cc  Anesthesia was provided by ethyl chloride  The injection was performed in the RIGHT  posterior subacromial space. After pinning the skin with alcohol and anesthetized the skin with ethyl chloride the subacromial space was injected using a 20-gauge needle. There were no complications  Sterile dressing was applied.    Procedure note the subacromial injection shoulder left   Verbal consent was obtained to inject the  Left   Shoulder  Timeout was completed to confirm the injection site is a subacromial space of the  left  shoulder  Medication used Depo-Medrol 40 mg and lidocaine 1% 3 cc  Anesthesia was provided by ethyl chloride  The injection was performed in the left  posterior subacromial space. After pinning the skin with alcohol and anesthetized the skin with ethyl chloride the subacromial space was injected using a 20-gauge needle. There were no complications  Sterile dressing was applied.

## 2020-07-30 ENCOUNTER — Emergency Department (HOSPITAL_COMMUNITY)
Admission: EM | Admit: 2020-07-30 | Discharge: 2020-07-30 | Disposition: A | Discharge status: Home / Self Care | Payer: Medicare Other | Attending: Emergency Medicine | Admitting: Emergency Medicine

## 2020-07-30 ENCOUNTER — Other Ambulatory Visit: Payer: Self-pay

## 2020-07-30 ENCOUNTER — Encounter (HOSPITAL_COMMUNITY): Payer: Self-pay | Admitting: *Deleted

## 2020-07-30 ENCOUNTER — Emergency Department (HOSPITAL_COMMUNITY): Payer: Medicare Other

## 2020-07-30 DIAGNOSIS — I10 Essential (primary) hypertension: Secondary | ICD-10-CM | POA: Insufficient documentation

## 2020-07-30 DIAGNOSIS — Z96641 Presence of right artificial hip joint: Secondary | ICD-10-CM | POA: Insufficient documentation

## 2020-07-30 DIAGNOSIS — Z79899 Other long term (current) drug therapy: Secondary | ICD-10-CM | POA: Insufficient documentation

## 2020-07-30 DIAGNOSIS — R112 Nausea with vomiting, unspecified: Secondary | ICD-10-CM | POA: Diagnosis not present

## 2020-07-30 DIAGNOSIS — R197 Diarrhea, unspecified: Secondary | ICD-10-CM | POA: Insufficient documentation

## 2020-07-30 DIAGNOSIS — R1012 Left upper quadrant pain: Secondary | ICD-10-CM | POA: Insufficient documentation

## 2020-07-30 DIAGNOSIS — R1030 Lower abdominal pain, unspecified: Secondary | ICD-10-CM | POA: Diagnosis present

## 2020-07-30 LAB — COMPREHENSIVE METABOLIC PANEL
ALT: 15 U/L (ref 0–44)
AST: 18 U/L (ref 15–41)
Albumin: 3.8 g/dL (ref 3.5–5.0)
Alkaline Phosphatase: 63 U/L (ref 38–126)
Anion gap: 8 (ref 5–15)
BUN: 15 mg/dL (ref 8–23)
CO2: 23 mmol/L (ref 22–32)
Calcium: 9.7 mg/dL (ref 8.9–10.3)
Chloride: 104 mmol/L (ref 98–111)
Creatinine, Ser: 0.85 mg/dL (ref 0.44–1.00)
GFR calc Af Amer: >60 mL/min (ref >60)
GFR calc non Af Amer: >60 mL/min (ref >60)
Glucose, Bld: 97 mg/dL (ref 70–99)
Potassium: 3.6 mmol/L (ref 3.5–5.1)
Sodium: 135 mmol/L (ref 135–145)
Total Bilirubin: 0.7 mg/dL (ref 0.3–1.2)
Total Protein: 7.3 g/dL (ref 6.5–8.1)

## 2020-07-30 LAB — URINALYSIS, ROUTINE W REFLEX MICROSCOPIC
Bilirubin Urine: NEGATIVE
Glucose, UA: NEGATIVE mg/dL
Hgb urine dipstick: NEGATIVE
Ketones, ur: NEGATIVE mg/dL
Leukocytes,Ua: NEGATIVE
Nitrite: NEGATIVE
Protein, ur: NEGATIVE mg/dL
Specific Gravity, Urine: 1.012 (ref 1.005–1.030)
pH: 6 (ref 5.0–8.0)

## 2020-07-30 LAB — CBC
HCT: 37 % (ref 36.0–46.0)
Hemoglobin: 12.4 g/dL (ref 12.0–15.0)
MCH: 30.4 pg (ref 26.0–34.0)
MCHC: 33.5 g/dL (ref 30.0–36.0)
MCV: 90.7 fL (ref 80.0–100.0)
Platelets: 289 10*3/uL (ref 150–400)
RBC: 4.08 MIL/uL (ref 3.87–5.11)
RDW: 12.1 % (ref 11.5–15.5)
WBC: 6.6 10*3/uL (ref 4.0–10.5)
nRBC: 0 % (ref 0.0–0.2)

## 2020-07-30 LAB — LIPASE, BLOOD: Lipase: 38 U/L (ref 11–51)

## 2020-07-30 MED ORDER — ONDANSETRON HCL 4 MG/2ML IJ SOLN
4.0000 mg | Freq: Once | INTRAMUSCULAR | Status: AC
  Administered 2020-07-30: 4 mg via INTRAVENOUS
  Filled 2020-07-30: qty 2

## 2020-07-30 MED ORDER — HYDROCODONE-ACETAMINOPHEN 5-325 MG PO TABS: ORAL_TABLET | ORAL | 0 refills | Status: DC

## 2020-07-30 MED ORDER — MORPHINE SULFATE (PF) 4 MG/ML IV SOLN
4.0000 mg | Freq: Once | INTRAVENOUS | Status: AC
  Administered 2020-07-30: 4 mg via INTRAVENOUS
  Filled 2020-07-30: qty 1

## 2020-07-30 MED ORDER — PANTOPRAZOLE SODIUM 40 MG PO TBEC: 40.0000 mg | DELAYED_RELEASE_TABLET | Freq: Every day | ORAL | 0 refills | Status: DC

## 2020-07-30 MED ORDER — IOHEXOL 9 MG/ML PO SOLN
ORAL | Status: AC
  Filled 2020-07-30: qty 1000

## 2020-07-30 MED ORDER — IOHEXOL 300 MG/ML  SOLN
100.0000 mL | Freq: Once | INTRAMUSCULAR | Status: AC | PRN
  Administered 2020-07-30: 100 mL via INTRAVENOUS
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
## 2020-07-30 NOTE — Discharge Instructions (Addendum)
The CT scan of your abdomen did not show evidence of diverticulitis.  You may have some acid reflux as well.  You have been prescribed medication to take for your pain and also medicine for your acid reflux.  It is important that you follow-up with your primary doctor later this week for recheck.  Return to the emergency department if you develop any worsening abdominal pain, persistent vomiting or fever.

## 2020-07-30 NOTE — ED Triage Notes (Signed)
Pt c/o abdominal pain with right and left side of abdomen and states she has been vomiting; pt has a hx of diverticulitis

## 2020-07-31 ENCOUNTER — Telehealth (HOSPITAL_COMMUNITY): Payer: Self-pay | Admitting: Emergency Medicine

## 2020-07-31 MED ORDER — PANTOPRAZOLE SODIUM 40 MG PO TBEC: 40.0000 mg | DELAYED_RELEASE_TABLET | Freq: Every day | ORAL | 0 refills | Status: DC

## 2020-07-31 MED ORDER — HYDROCODONE-ACETAMINOPHEN 5-325 MG PO TABS: ORAL_TABLET | ORAL | 0 refills | Status: DC

## 2020-07-31 NOTE — Telephone Encounter (Signed)
Patient reports prescriptions did not transmit to the pharmacy. Chart reviewed. Rx for Protonix sent, will hold on Norco Rx until prescribing provider returns this afternoon.

## 2020-07-31 NOTE — Telephone Encounter (Signed)
Pharmacy called stating that medications electronically prescribed yesterday did not go through.  Pantoprazole resent, patient already received hydrocodone prescription from PCP, so hydrocodone cancelled

## 2020-08-02 ENCOUNTER — Encounter: Payer: Self-pay | Admitting: Internal Medicine

## 2020-08-03 ENCOUNTER — Ambulatory Visit (INDEPENDENT_AMBULATORY_CARE_PROVIDER_SITE_OTHER): Payer: Medicare Other | Admitting: Orthopedic Surgery

## 2020-08-03 ENCOUNTER — Other Ambulatory Visit: Payer: Self-pay

## 2020-08-03 VITALS — BP 151/59 | HR 87 | Ht 60.0 in | Wt 225.0 lb

## 2020-08-03 DIAGNOSIS — M25511 Pain in right shoulder: Secondary | ICD-10-CM

## 2020-08-03 DIAGNOSIS — M25512 Pain in left shoulder: Secondary | ICD-10-CM

## 2020-08-03 NOTE — Patient Instructions (Signed)

## 2020-08-03 NOTE — Progress Notes (Signed)
Chief Complaint  Patient presents with  . Shoulder Pain    inject both shoulders     Procedure injection right shoulder subacromial joint and left shoulder subacromial joint   procedure note the subacromial injection shoulder RIGHT  Verbal consent was obtained to inject the  RIGHT   Shoulder  Timeout was completed to confirm the injection site is a subacromial space of the  RIGHT  shoulder   Medication used Depo-Medrol 40 mg and lidocaine 1% 3 cc  Anesthesia was provided by ethyl chloride  The injection was performed in the RIGHT  posterior subacromial space. After pinning the skin with alcohol and anesthetized the skin with ethyl chloride the subacromial space was injected using a 20-gauge needle. There were no complications  Sterile dressing was applied.    Procedure note the subacromial injection shoulder left   Verbal consent was obtained to inject the  Left   Shoulder  Timeout was completed to confirm the injection site is a subacromial space of the  left  shoulder  Medication used Depo-Medrol 40 mg and lidocaine 1% 3 cc  Anesthesia was provided by ethyl chloride  The injection was performed in the left  posterior subacromial space. After pinning the skin with alcohol and anesthetized the skin with ethyl chloride the subacromial space was injected using a 20-gauge needle. There were no complications  Sterile dressing was applied.  Encounter Diagnosis  Name Primary?  . Pain of both shoulder joints Yes

## 2020-10-03 ENCOUNTER — Other Ambulatory Visit (HOSPITAL_COMMUNITY): Payer: Self-pay | Admitting: Family Medicine

## 2020-10-03 DIAGNOSIS — Z1231 Encounter for screening mammogram for malignant neoplasm of breast: Secondary | ICD-10-CM

## 2020-10-18 ENCOUNTER — Ambulatory Visit (HOSPITAL_COMMUNITY)
Admission: RE | Admit: 2020-10-18 | Discharge: 2020-10-18 | Disposition: A | Discharge status: Home / Self Care | Payer: Medicare Other | Source: Ambulatory Visit | Attending: Family Medicine | Admitting: Family Medicine

## 2020-10-18 ENCOUNTER — Other Ambulatory Visit: Payer: Self-pay

## 2020-10-18 DIAGNOSIS — Z1231 Encounter for screening mammogram for malignant neoplasm of breast: Secondary | ICD-10-CM | POA: Insufficient documentation

## 2021-04-23 ENCOUNTER — Ambulatory Visit (INDEPENDENT_AMBULATORY_CARE_PROVIDER_SITE_OTHER): Payer: Medicare Other | Admitting: Orthopedic Surgery

## 2021-04-23 ENCOUNTER — Encounter: Payer: Self-pay | Admitting: Orthopedic Surgery

## 2021-04-23 ENCOUNTER — Other Ambulatory Visit: Payer: Self-pay

## 2021-04-23 VITALS — BP 140/56 | HR 67 | Ht 60.0 in | Wt 225.8 lb

## 2021-04-23 DIAGNOSIS — M25511 Pain in right shoulder: Secondary | ICD-10-CM | POA: Diagnosis not present

## 2021-04-23 DIAGNOSIS — M25512 Pain in left shoulder: Secondary | ICD-10-CM

## 2021-04-23 NOTE — Patient Instructions (Signed)

## 2021-04-23 NOTE — Progress Notes (Signed)
Chief Complaint  Patient presents with   pain of both shoulder joints   Injections    Bilateral shoulders   Requested bilateral injections   Procedure note the subacromial injection shoulder RIGHT    Verbal consent was obtained to inject the  RIGHT   Shoulder  Timeout was completed to confirm the injection site is a subacromial space of the  RIGHT  shoulder   Medication used Depo-Medrol 40 mg and lidocaine 1% 3 cc  Anesthesia was provided by ethyl chloride  The injection was performed in the RIGHT  posterior subacromial space. After pinning the skin with alcohol and anesthetized the skin with ethyl chloride the subacromial space was injected using a 20-gauge needle. There were no complications  Sterile dressing was applied.    Procedure note the subacromial injection shoulder left   Verbal consent was obtained to inject the  Left   Shoulder  Timeout was completed to confirm the injection site is a subacromial space of the  left  shoulder  Medication used Depo-Medrol 40 mg and lidocaine 1% 3 cc  Anesthesia was provided by ethyl chloride  The injection was performed in the left  posterior subacromial space. After pinning the skin with alcohol and anesthetized the skin with ethyl chloride the subacromial space was injected using a 20-gauge needle. There were no complications  Sterile dressing was applied.

## 2021-06-17 ENCOUNTER — Encounter (HOSPITAL_COMMUNITY): Payer: Self-pay | Admitting: Emergency Medicine

## 2021-06-17 ENCOUNTER — Other Ambulatory Visit: Payer: Self-pay

## 2021-06-17 ENCOUNTER — Emergency Department (HOSPITAL_COMMUNITY)
Admission: EM | Admit: 2021-06-17 | Discharge: 2021-06-17 | Disposition: A | Discharge status: Home / Self Care | Payer: Medicare Other | Attending: Emergency Medicine | Admitting: Emergency Medicine

## 2021-06-17 DIAGNOSIS — L299 Pruritus, unspecified: Secondary | ICD-10-CM | POA: Diagnosis not present

## 2021-06-17 DIAGNOSIS — Z96641 Presence of right artificial hip joint: Secondary | ICD-10-CM | POA: Insufficient documentation

## 2021-06-17 DIAGNOSIS — R21 Rash and other nonspecific skin eruption: Secondary | ICD-10-CM | POA: Diagnosis present

## 2021-06-17 DIAGNOSIS — I10 Essential (primary) hypertension: Secondary | ICD-10-CM | POA: Insufficient documentation

## 2021-06-17 DIAGNOSIS — Z79899 Other long term (current) drug therapy: Secondary | ICD-10-CM | POA: Insufficient documentation

## 2021-06-17 MED ORDER — PREDNISONE 50 MG PO TABS
60.0000 mg | ORAL_TABLET | Freq: Once | ORAL | Status: AC
  Administered 2021-06-17: 60 mg via ORAL
  Filled 2021-06-17: qty 1

## 2021-06-17 MED ORDER — DIPHENHYDRAMINE HCL 25 MG PO CAPS
25.0000 mg | ORAL_CAPSULE | Freq: Once | ORAL | Status: AC
  Administered 2021-06-17: 25 mg via ORAL
  Filled 2021-06-17: qty 1

## 2021-06-17 MED ORDER — PREDNISONE 20 MG PO TABS: ORAL_TABLET | ORAL | 0 refills | Status: DC

## 2021-06-17 NOTE — ED Triage Notes (Signed)
Pt states she was having bilateral knee pain so she took a hydrocodone and a benadryl and then she "broke out into a rash". Pt with c/o itching and redness to both arms and back.
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
## 2021-06-17 NOTE — Discharge Instructions (Addendum)
If you develop fever, new or worsening rash, trouble breathing or swallowing, vomiting, or any other new/concerning symptoms then return to the ER for evaluation.  You are being prescribed prednisone number given the first dose here in the emergency department.  Start your prescription tomorrow.  Follow-up closely with your primary care physician, especially if not improving in 24-48 hours.

## 2021-08-09 ENCOUNTER — Ambulatory Visit (INDEPENDENT_AMBULATORY_CARE_PROVIDER_SITE_OTHER): Payer: Medicare Other | Admitting: Orthopedic Surgery

## 2021-08-09 ENCOUNTER — Other Ambulatory Visit: Payer: Self-pay

## 2021-08-09 ENCOUNTER — Encounter: Payer: Self-pay | Admitting: Orthopedic Surgery

## 2021-08-09 VITALS — BP 125/62 | HR 75 | Ht 60.0 in | Wt 220.0 lb

## 2021-08-09 DIAGNOSIS — R202 Paresthesia of skin: Secondary | ICD-10-CM | POA: Diagnosis not present

## 2021-08-09 DIAGNOSIS — M25512 Pain in left shoulder: Secondary | ICD-10-CM

## 2021-08-09 DIAGNOSIS — M25511 Pain in right shoulder: Secondary | ICD-10-CM

## 2021-08-09 NOTE — Progress Notes (Signed)
FOLLOW UP   Encounter Diagnoses  Name Primary?   Pain of both shoulder joints Yes   Paresthesia of both hands      Chief Complaint  Patient presents with   Shoulder Pain    Bilateral, request injections bilat shoulder   Hand Problem    Bilateral hands numb and has decreased grip strength    Procedure note  Injection  Verbal consent was obtained to inject the right shoulder  Timeout procedure was completed to confirm injection site  Diagnosis chronic shoulder pain chronic rotator cuff insufficiency  Medications used Celestone Lidocaine 1% plain 3 cc  Anesthesia was provided by ethyl chloride spray  Prep was performed with alcohol  Technique of injection posterior approach to the subacromial space 21-gauge needle used to inject the medication  No complications were noted Procedure note  Injection  Verbal consent was obtained to inject the left shoulder  Timeout procedure was completed to confirm injection site  Diagnosis chronic left shoulder pain with rotator cuff insufficiency  Medications used Celestone Lidocaine 1% plain 3 cc  Anesthesia was provided by ethyl chloride spray  Prep was performed with alcohol  Technique of injection subacromial space injected with 27-CWCBJ needle  No complications were noted   Ms. Hoog also complains of a 2 to 3-year history of numbness and tingling in her hands associated with a burning cold sensation which is worse at night it is exacerbated by trying to hold onto things  I have advised that she get a carpal tunnel test and that we put her in braces

## 2021-08-09 NOTE — Patient Instructions (Signed)
Driving Directions to [employer] from [employer] address is [place] The phone number is [phone]   1. Start out going [place] on [address] toward [address].  Then 0.02 miles0.02 total miles 2. Take the 1st right onto [address]. Continue to follow [address].  If you reach [facility] you've gone a little too far  Then 0.58 miles0.60 total miles 3. Turn right onto [place] is just past [facility]  Then 2.25 miles2.85 total miles 4. Take the [address] ramp toward [place].  Then 0.25 miles3.10 total miles 5. Merge onto [address]  Then 18.17 miles21.28 total miles 6. Merge onto [address]  Then 1.47 miles22.74 total miles 7. Turn right onto [place] is just past [place]  Then 0.11 miles22.85 total miles  8. [address], [place], [place] [id number], [place] is on the left.

## 2021-08-11 ENCOUNTER — Encounter (INDEPENDENT_AMBULATORY_CARE_PROVIDER_SITE_OTHER): Payer: Self-pay

## 2021-08-13 ENCOUNTER — Telehealth: Payer: Self-pay | Admitting: Physical Medicine and Rehabilitation

## 2021-08-13 NOTE — Telephone Encounter (Signed)
Pt calling to get an appt set up with Dr. Ernestina Patches. Pt is currently in the referrals. The best call back number is [phone].

## 2021-08-21 ENCOUNTER — Encounter: Payer: Medicare Other | Admitting: Physical Medicine and Rehabilitation

## 2021-08-24 ENCOUNTER — Ambulatory Visit (INDEPENDENT_AMBULATORY_CARE_PROVIDER_SITE_OTHER): Payer: Medicare Other | Admitting: Physical Medicine and Rehabilitation

## 2021-08-24 ENCOUNTER — Encounter: Payer: Self-pay | Admitting: Physical Medicine and Rehabilitation

## 2021-08-24 ENCOUNTER — Other Ambulatory Visit: Payer: Self-pay

## 2021-08-24 DIAGNOSIS — R202 Paresthesia of skin: Secondary | ICD-10-CM

## 2021-08-24 NOTE — Progress Notes (Signed)
Burning, numbness, and tingling in hands. Right hand is worse. Worse in second through fifth fingers. Right hand dominant No lotion per patient

## 2021-08-26 NOTE — Progress Notes (Signed)
SCARLETTROSE [name] - 65 y.o. female MRN [id number]  Date of birth: Dec 20, 1955  Office Visit Note: Visit Date: 08/24/2021 PCP: Leslie Andrea, MD Referred by: Leslie Andrea, MD  Subjective: Chief Complaint  Patient presents with   Right Hand - Numbness   Left Hand - Numbness   HPI:  Rachel [name] is a 65 y.o. female who comes in today at the request of Dr. Arther Abbott for electrodiagnostic study of the Bilateral upper extremities.  Patient is Right hand dominant.  She describes chronic severe and almost constant burning and numbness and tingling in both hands right more than left.  She does endorse some feeling of weakness at times.  She does get nocturnal complaints.  She gets worsening of the second through fifth digits.  She feels like and can be the whole hand at times.  No frank radicular symptoms.  Last hemoglobin A1c 6.1 with borderline high glucose but no diagnosis of diabetes.  No history of polyneuropathy.   ROS Otherwise per HPI.  Assessment & Plan: Visit Diagnoses:    ICD-10-CM   1. Paresthesia of skin  R20.2 NCV with EMG (electromyography)      Plan: Impression: The above electrodiagnostic study is ABNORMAL and reveals evidence of a severe bilateral median nerve entrapment at the wrist (carpal tunnel syndrome) affecting sensory and motor components.   There is no significant electrodiagnostic evidence of any other focal nerve entrapment, brachial plexopathy or cervical radiculopathy.   Recommendations: 1.  Follow-up with referring physician. 2.  Continue current management of symptoms. 3.  Suggest surgical evaluation.  Meds & Orders: No orders of the defined types were placed in this encounter.   Orders Placed This Encounter  Procedures   NCV with EMG (electromyography)    Follow-up: Return in about 2 weeks (around 09/07/2021) for Arther Abbott, MD.   Procedures: No procedures performed  EMG & NCV Findings: Evaluation of the left  median motor and the right median motor nerves showed prolonged distal onset latency (L5.4, R5.9 ms), reduced amplitude (L4.6, R3.8 mV), and decreased conduction velocity (Elbow-Wrist, L45, R46 m/s).  The left median (across palm) sensory nerve showed no response (Wrist) and no response (Palm).  The right median (across palm) sensory nerve showed no response (Wrist) and prolonged distal peak latency (Palm, 5.4 ms).  All remaining nerves (as indicated in the following tables) were within normal limits.  All left vs. right side differences were within normal limits.    All examined muscles (as indicated in the following table) showed no evidence of electrical instability.    Impression: The above electrodiagnostic study is ABNORMAL and reveals evidence of a severe bilateral median nerve entrapment at the wrist (carpal tunnel syndrome) affecting sensory and motor components.   There is no significant electrodiagnostic evidence of any other focal nerve entrapment, brachial plexopathy or cervical radiculopathy.   Recommendations: 1.  Follow-up with referring physician. 2.  Continue current management of symptoms. 3.  Suggest surgical evaluation.  ___________________________ Laurence Spates FAAPMR Board Certified, American Board of Physical Medicine and Rehabilitation    Nerve Conduction Studies Anti Sensory Summary Table   Stim Site NR Peak (ms) Norm Peak (ms) P-T Amp (V) Norm P-T Amp Site1 Site2 Delta-P (ms) Dist (cm) Vel (m/s) Norm Vel (m/s)  Left Median Acr Palm Anti Sensory (2nd Digit)  31.9C  Wrist *NR  <3.6  >10 Wrist Palm  0.0    Palm *NR  <2.0  Right Median Acr Palm Anti Sensory (2nd Digit)  31.2C  Wrist *NR  <3.6  >10 Wrist Palm  0.0    Palm    *5.4 <2.0 12.0         Left Radial Anti Sensory (Base 1st Digit)  31.3C  Wrist    2.2 <3.1 38.1  Wrist Base 1st Digit 2.2 0.0    Right Radial Anti Sensory (Base 1st Digit)  31.3C  Wrist    2.1 <3.1 29.1  Wrist Base 1st Digit 2.1  0.0    Left Ulnar Anti Sensory (5th Digit)  31.8C  Wrist    3.4 <3.7 19.4 >15.0 Wrist 5th Digit 3.4 14.0 41 >38  Right Ulnar Anti Sensory (5th Digit)  31.4C  Wrist    3.5 <3.7 17.7 >15.0 Wrist 5th Digit 3.5 14.0 40 >38   Motor Summary Table   Stim Site NR Onset (ms) Norm Onset (ms) O-P Amp (mV) Norm O-P Amp Site1 Site2 Delta-0 (ms) Dist (cm) Vel (m/s) Norm Vel (m/s)  Left Median Motor (Abd Poll Brev)  31.3C  Wrist    *5.4 <4.2 *4.6 >5 Elbow Wrist 4.6 20.5 *45 >50  Elbow    10.0  4.2         Right Median Motor (Abd Poll Brev)  31.2C  Wrist    *5.9 <4.2 *3.8 >5 Elbow Wrist 4.6 21.0 *46 >50  Elbow    10.5  3.5         Left Ulnar Motor (Abd Dig Min)  31.3C  Wrist    2.7 <4.2 8.4 >3 B Elbow Wrist 3.4 20.0 59 >53  B Elbow    6.1  8.0  A Elbow B Elbow 1.5 10.0 67 >53  A Elbow    7.6  6.2         Right Ulnar Motor (Abd Dig Min)  31.3C  Wrist    3.0 <4.2 7.0 >3 B Elbow Wrist 3.0 19.0 63 >53  B Elbow    6.0  6.2  A Elbow B Elbow 1.3 10.0 77 >53  A Elbow    7.3  7.9          EMG   Side Muscle Nerve Root Ins Act Fibs Psw Amp Dur Poly Recrt Int Fraser Din Comment  Right Abd Poll Brev Median C8-T1 Nml Nml Nml Nml Nml 0 Nml Nml   Right 1stDorInt Ulnar C8-T1 Nml Nml Nml Nml Nml 0 Nml Nml   Right PronatorTeres Median C6-7 Nml Nml Nml Nml Nml 0 Nml Nml   Right Biceps Musculocut C5-6 Nml Nml Nml Nml Nml 0 Nml Nml   Right Deltoid Axillary C5-6 Nml Nml Nml Nml Nml 0 Nml Nml     Nerve Conduction Studies Anti Sensory Left/Right Comparison   Stim Site L Lat (ms) R Lat (ms) L-R Lat (ms) L Amp (V) R Amp (V) L-R Amp (%) Site1 Site2 L Vel (m/s) R Vel (m/s) L-R Vel (m/s)  Median Acr Palm Anti Sensory (2nd Digit)  31.9C  Wrist       Wrist Palm     Palm  *5.4   12.0        Radial Anti Sensory (Base 1st Digit)  31.3C  Wrist 2.2 2.1 0.1 38.1 29.1 23.6 Wrist Base 1st Digit     Ulnar Anti Sensory (5th Digit)  31.8C  Wrist 3.4 3.5 0.1 19.4 17.7 8.8 Wrist 5th Digit 41 40 1   Motor Left/Right  Comparison   Stim Site L Lat (  ms) R Lat (ms) L-R Lat (ms) L Amp (mV) R Amp (mV) L-R Amp (%) Site1 Site2 L Vel (m/s) R Vel (m/s) L-R Vel (m/s)  Median Motor (Abd Poll Brev)  31.3C  Wrist *5.4 *5.9 0.5 *4.6 *3.8 17.4 Elbow Wrist *45 *46 1  Elbow 10.0 10.5 0.5 4.2 3.5 16.7       Ulnar Motor (Abd Dig Min)  31.3C  Wrist 2.7 3.0 0.3 8.4 7.0 16.7 B Elbow Wrist 59 63 4  B Elbow 6.1 6.0 0.1 8.0 6.2 22.5 A Elbow B Elbow 67 77 10  A Elbow 7.6 7.3 0.3 6.2 7.9 21.5          Waveforms:                     Clinical History: No specialty comments available.     Objective:  VS:  HT:    WT:   BMI:     BP:   HR: bpm  TEMP: ( )  RESP:  Physical Exam Musculoskeletal:        General: No swelling, tenderness or deformity.     Comments: Inspection reveals no atrophy of the bilateral APB or FDI or hand intrinsics. There is no swelling, color changes, allodynia or dystrophic changes. There is 5 out of 5 strength in the bilateral wrist extension, finger abduction and long finger flexion.  There is decreased sensation to light touch in the right median nerve distribution. There is a negative Tinel's test at the bilateral wrist and elbow. There is a positive Phalen's test bilaterally. There is a negative Hoffmann's test bilaterally.  Skin:    General: Skin is warm and dry.     Findings: No erythema or rash.  Neurological:     General: No focal deficit present.     Mental Status: She is alert and oriented to person, place, and time.     Motor: No weakness or abnormal muscle tone.     Coordination: Coordination normal.  Psychiatric:        Mood and Affect: Mood normal.        Behavior: Behavior normal.     Imaging: No results found.

## 2021-08-26 NOTE — Procedures (Signed)
EMG & NCV Findings: Evaluation of the left median motor and the right median motor nerves showed prolonged distal onset latency (L5.4, R5.9 ms), reduced amplitude (L4.6, R3.8 mV), and decreased conduction velocity (Elbow-Wrist, L45, R46 m/s).  The left median (across palm) sensory nerve showed no response (Wrist) and no response (Palm).  The right median (across palm) sensory nerve showed no response (Wrist) and prolonged distal peak latency (Palm, 5.4 ms).  All remaining nerves (as indicated in the following tables) were within normal limits.  All left vs. right side differences were within normal limits.    All examined muscles (as indicated in the following table) showed no evidence of electrical instability.    Impression: The above electrodiagnostic study is ABNORMAL and reveals evidence of a severe bilateral median nerve entrapment at the wrist (carpal tunnel syndrome) affecting sensory and motor components.   There is no significant electrodiagnostic evidence of any other focal nerve entrapment, brachial plexopathy or cervical radiculopathy.   Recommendations: 1.  Follow-up with referring physician. 2.  Continue current management of symptoms. 3.  Suggest surgical evaluation.  ___________________________ Laurence Spates FAAPMR Board Certified, American Board of Physical Medicine and Rehabilitation    Nerve Conduction Studies Anti Sensory Summary Table   Stim Site NR Peak (ms) Norm Peak (ms) P-T Amp (V) Norm P-T Amp Site1 Site2 Delta-P (ms) Dist (cm) Vel (m/s) Norm Vel (m/s)  Left Median Acr Palm Anti Sensory (2nd Digit)  31.9C  Wrist *NR  <3.6  >10 Wrist Palm  0.0    Palm *NR  <2.0          Right Median Acr Palm Anti Sensory (2nd Digit)  31.2C  Wrist *NR  <3.6  >10 Wrist Palm  0.0    Palm    *5.4 <2.0 12.0         Left Radial Anti Sensory (Base 1st Digit)  31.3C  Wrist    2.2 <3.1 38.1  Wrist Base 1st Digit 2.2 0.0    Right Radial Anti Sensory (Base 1st Digit)  31.3C  Wrist     2.1 <3.1 29.1  Wrist Base 1st Digit 2.1 0.0    Left Ulnar Anti Sensory (5th Digit)  31.8C  Wrist    3.4 <3.7 19.4 >15.0 Wrist 5th Digit 3.4 14.0 41 >38  Right Ulnar Anti Sensory (5th Digit)  31.4C  Wrist    3.5 <3.7 17.7 >15.0 Wrist 5th Digit 3.5 14.0 40 >38   Motor Summary Table   Stim Site NR Onset (ms) Norm Onset (ms) O-P Amp (mV) Norm O-P Amp Site1 Site2 Delta-0 (ms) Dist (cm) Vel (m/s) Norm Vel (m/s)  Left Median Motor (Abd Poll Brev)  31.3C  Wrist    *5.4 <4.2 *4.6 >5 Elbow Wrist 4.6 20.5 *45 >50  Elbow    10.0  4.2         Right Median Motor (Abd Poll Brev)  31.2C  Wrist    *5.9 <4.2 *3.8 >5 Elbow Wrist 4.6 21.0 *46 >50  Elbow    10.5  3.5         Left Ulnar Motor (Abd Dig Min)  31.3C  Wrist    2.7 <4.2 8.4 >3 B Elbow Wrist 3.4 20.0 59 >53  B Elbow    6.1  8.0  A Elbow B Elbow 1.5 10.0 67 >53  A Elbow    7.6  6.2         Right Ulnar Motor (Abd Dig Min)  31.3C  Wrist  3.0 <4.2 7.0 >3 B Elbow Wrist 3.0 19.0 63 >53  B Elbow    6.0  6.2  A Elbow B Elbow 1.3 10.0 77 >53  A Elbow    7.3  7.9          EMG   Side Muscle Nerve Root Ins Act Fibs Psw Amp Dur Poly Recrt Int Fraser Din Comment  Right Abd Poll Brev Median C8-T1 Nml Nml Nml Nml Nml 0 Nml Nml   Right 1stDorInt Ulnar C8-T1 Nml Nml Nml Nml Nml 0 Nml Nml   Right PronatorTeres Median C6-7 Nml Nml Nml Nml Nml 0 Nml Nml   Right Biceps Musculocut C5-6 Nml Nml Nml Nml Nml 0 Nml Nml   Right Deltoid Axillary C5-6 Nml Nml Nml Nml Nml 0 Nml Nml     Nerve Conduction Studies Anti Sensory Left/Right Comparison   Stim Site L Lat (ms) R Lat (ms) L-R Lat (ms) L Amp (V) R Amp (V) L-R Amp (%) Site1 Site2 L Vel (m/s) R Vel (m/s) L-R Vel (m/s)  Median Acr Palm Anti Sensory (2nd Digit)  31.9C  Wrist       Wrist Palm     Palm  *5.4   12.0        Radial Anti Sensory (Base 1st Digit)  31.3C  Wrist 2.2 2.1 0.1 38.1 29.1 23.6 Wrist Base 1st Digit     Ulnar Anti Sensory (5th Digit)  31.8C  Wrist 3.4 3.5 0.1 19.4 17.7 8.8 Wrist 5th  Digit 41 40 1   Motor Left/Right Comparison   Stim Site L Lat (ms) R Lat (ms) L-R Lat (ms) L Amp (mV) R Amp (mV) L-R Amp (%) Site1 Site2 L Vel (m/s) R Vel (m/s) L-R Vel (m/s)  Median Motor (Abd Poll Brev)  31.3C  Wrist *5.4 *5.9 0.5 *4.6 *3.8 17.4 Elbow Wrist *45 *46 1  Elbow 10.0 10.5 0.5 4.2 3.5 16.7       Ulnar Motor (Abd Dig Min)  31.3C  Wrist 2.7 3.0 0.3 8.4 7.0 16.7 B Elbow Wrist 59 63 4  B Elbow 6.1 6.0 0.1 8.0 6.2 22.5 A Elbow B Elbow 67 77 10  A Elbow 7.6 7.3 0.3 6.2 7.9 21.5          Waveforms:

## 2021-09-03 ENCOUNTER — Encounter: Payer: Self-pay | Admitting: Orthopedic Surgery

## 2021-09-03 ENCOUNTER — Other Ambulatory Visit: Payer: Self-pay

## 2021-09-03 ENCOUNTER — Ambulatory Visit (INDEPENDENT_AMBULATORY_CARE_PROVIDER_SITE_OTHER): Payer: Medicare Other | Admitting: Orthopedic Surgery

## 2021-09-03 VITALS — BP 146/56 | HR 54 | Ht 60.0 in | Wt 221.0 lb

## 2021-09-03 DIAGNOSIS — G5603 Carpal tunnel syndrome, bilateral upper limbs: Secondary | ICD-10-CM

## 2021-09-03 NOTE — Patient Instructions (Signed)
Call us after hip surgery and you are using a cane
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
## 2021-09-14 ENCOUNTER — Other Ambulatory Visit: Payer: Self-pay

## 2021-09-14 ENCOUNTER — Emergency Department (HOSPITAL_COMMUNITY): Payer: Medicare Other

## 2021-09-14 ENCOUNTER — Emergency Department (HOSPITAL_COMMUNITY)
Admission: EM | Admit: 2021-09-14 | Discharge: 2021-09-15 | Disposition: A | Discharge status: Home / Self Care | Payer: Medicare Other | Attending: Emergency Medicine | Admitting: Emergency Medicine

## 2021-09-14 ENCOUNTER — Encounter (HOSPITAL_COMMUNITY): Payer: Self-pay | Admitting: *Deleted

## 2021-09-14 DIAGNOSIS — M546 Pain in thoracic spine: Secondary | ICD-10-CM | POA: Insufficient documentation

## 2021-09-14 DIAGNOSIS — Z79899 Other long term (current) drug therapy: Secondary | ICD-10-CM | POA: Diagnosis not present

## 2021-09-14 DIAGNOSIS — M542 Cervicalgia: Secondary | ICD-10-CM | POA: Diagnosis present

## 2021-09-14 DIAGNOSIS — Z20822 Contact with and (suspected) exposure to covid-19: Secondary | ICD-10-CM | POA: Insufficient documentation

## 2021-09-14 DIAGNOSIS — M545 Low back pain, unspecified: Secondary | ICD-10-CM | POA: Diagnosis not present

## 2021-09-14 DIAGNOSIS — Z96641 Presence of right artificial hip joint: Secondary | ICD-10-CM | POA: Insufficient documentation

## 2021-09-14 DIAGNOSIS — M791 Myalgia, unspecified site: Secondary | ICD-10-CM | POA: Diagnosis not present

## 2021-09-14 DIAGNOSIS — M7989 Other specified soft tissue disorders: Secondary | ICD-10-CM | POA: Diagnosis not present

## 2021-09-14 DIAGNOSIS — I10 Essential (primary) hypertension: Secondary | ICD-10-CM | POA: Insufficient documentation

## 2021-09-14 DIAGNOSIS — M62830 Muscle spasm of back: Secondary | ICD-10-CM

## 2021-09-14 LAB — COMPREHENSIVE METABOLIC PANEL
ALT: 13 U/L (ref 0–44)
AST: 20 U/L (ref 15–41)
Albumin: 3.7 g/dL (ref 3.5–5.0)
Alkaline Phosphatase: 70 U/L (ref 38–126)
Anion gap: 10 (ref 5–15)
BUN: 22 mg/dL (ref 8–23)
CO2: 24 mmol/L (ref 22–32)
Calcium: 9.2 mg/dL (ref 8.9–10.3)
Chloride: 103 mmol/L (ref 98–111)
Creatinine, Ser: 0.86 mg/dL (ref 0.44–1.00)
GFR, Estimated: >60 mL/min (ref >60)
Glucose, Bld: 120 mg/dL — ABNORMAL HIGH (ref 70–99)
Potassium: 3.4 mmol/L — ABNORMAL LOW (ref 3.5–5.1)
Sodium: 137 mmol/L (ref 135–145)
Total Bilirubin: 0.5 mg/dL (ref 0.3–1.2)
Total Protein: 7 g/dL (ref 6.5–8.1)

## 2021-09-14 LAB — CBC WITH DIFFERENTIAL/PLATELET
Abs Immature Granulocytes: 0.03 10*3/uL (ref 0.00–0.07)
Basophils Absolute: 0.1 10*3/uL (ref 0.0–0.1)
Basophils Relative: 1 %
Eosinophils Absolute: 0.1 10*3/uL (ref 0.0–0.5)
Eosinophils Relative: 2 %
HCT: 36.4 % (ref 36.0–46.0)
Hemoglobin: 12.1 g/dL (ref 12.0–15.0)
Immature Granulocytes: 0 %
Lymphocytes Relative: 16 %
Lymphs Abs: 1.4 10*3/uL (ref 0.7–4.0)
MCH: 31 pg (ref 26.0–34.0)
MCHC: 33.2 g/dL (ref 30.0–36.0)
MCV: 93.3 fL (ref 80.0–100.0)
Monocytes Absolute: 1 10*3/uL (ref 0.1–1.0)
Monocytes Relative: 12 %
Neutro Abs: 6.3 10*3/uL (ref 1.7–7.7)
Neutrophils Relative %: 69 %
Platelets: 252 10*3/uL (ref 150–400)
RBC: 3.9 MIL/uL (ref 3.87–5.11)
RDW: 12.9 % (ref 11.5–15.5)
WBC: 8.9 10*3/uL (ref 4.0–10.5)
nRBC: 0 % (ref 0.0–0.2)

## 2021-09-14 LAB — CK: Total CK: 184 U/L (ref 38–234)

## 2021-09-14 LAB — RESP PANEL BY RT-PCR (FLU A&B, COVID) ARPGX2
Influenza A by PCR: NEGATIVE
Influenza B by PCR: NEGATIVE
SARS Coronavirus 2 by RT PCR: NEGATIVE

## 2021-09-14 MED ORDER — METHOCARBAMOL 500 MG PO TABS
500.0000 mg | ORAL_TABLET | Freq: Once | ORAL | Status: AC
  Administered 2021-09-14: 500 mg via ORAL
  Filled 2021-09-14: qty 1

## 2021-09-14 MED ORDER — HYDROMORPHONE HCL 1 MG/ML IJ SOLN
1.0000 mg | Freq: Once | INTRAMUSCULAR | Status: AC
  Administered 2021-09-15: 1 mg via INTRAVENOUS
  Filled 2021-09-14: qty 1

## 2021-09-14 MED ORDER — KETOROLAC TROMETHAMINE 30 MG/ML IJ SOLN
15.0000 mg | Freq: Once | INTRAMUSCULAR | Status: AC
  Administered 2021-09-15: 15 mg via INTRAVENOUS
  Filled 2021-09-14: qty 1

## 2021-09-14 NOTE — ED Notes (Signed)
Pt ambulatory to bathroom and back to room-steady gait noted. NAD.

## 2021-09-14 NOTE — ED Triage Notes (Signed)
Pt with body aches on the left side that started today. 1800 took a muscle relaxer and hydrocodone without any relief.  Tried a heating pad as well.

## 2021-09-14 NOTE — ED Notes (Cosign Needed)
Pt c/o left sided back pain from neck to waist- pillow placed behind pt back and hob raised for comfort. Pt took pain pill and muscle relaxer prior to arrival. Dr Langston Masker made aware.

## 2021-09-14 NOTE — ED Notes (Signed)
ED Provider at bedside. Dr Wyvonnia Dusky

## 2021-09-14 NOTE — ED Notes (Signed)
2 IV attempts - unsuccessful- will use [place] for next attempt

## 2021-09-15 DIAGNOSIS — M542 Cervicalgia: Secondary | ICD-10-CM | POA: Diagnosis not present

## 2021-09-15 LAB — TROPONIN I (HIGH SENSITIVITY)
Troponin I (High Sensitivity): 8 ng/L (ref <18)
Troponin I (High Sensitivity): 8 ng/L (ref <18)

## 2021-09-15 MED ORDER — NAPROXEN 500 MG PO TABS: 500.0000 mg | ORAL_TABLET | Freq: Two times a day (BID) | ORAL | 0 refills | Status: DC

## 2021-09-15 MED ORDER — IOHEXOL 350 MG/ML SOLN
125.0000 mL | Freq: Once | INTRAVENOUS | Status: AC | PRN
  Administered 2021-09-15: 125 mL via INTRAVENOUS

## 2021-09-15 MED ORDER — METHOCARBAMOL 500 MG PO TABS: 500.0000 mg | ORAL_TABLET | Freq: Three times a day (TID) | ORAL | 0 refills | Status: DC | PRN

## 2021-09-15 MED ORDER — OXYCODONE-ACETAMINOPHEN 5-325 MG PO TABS
2.0000 | ORAL_TABLET | Freq: Once | ORAL | Status: AC
  Administered 2021-09-15: 2 via ORAL
  Filled 2021-09-15: qty 2

## 2021-09-15 MED ORDER — DIAZEPAM 2 MG PO TABS
2.0000 mg | ORAL_TABLET | Freq: Once | ORAL | Status: AC
  Administered 2021-09-15: 2 mg via ORAL
  Filled 2021-09-15: qty 1

## 2021-09-15 NOTE — ED Notes (Signed)
Gwyndolyn Saxon, CT tech at bedside - pt sleeping- woke pt up to transport to CT

## 2021-09-15 NOTE — Discharge Instructions (Signed)
Your testing is reassuring.  Take the anti-inflammatories and muscle relaxers as prescribed.  Follow-up with your primary doctor.  Return to the ED with worsening pain, weakness, numbness, tingling, bowel or bladder incontinence, fever or any other concerns.
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
## 2021-09-15 NOTE — ED Notes (Signed)
Pt ambulated to bathroom. MD at bedside with pt during ambulation

## 2021-09-17 ENCOUNTER — Telehealth: Payer: Self-pay | Admitting: Orthopedic Surgery

## 2021-09-17 DIAGNOSIS — M542 Cervicalgia: Secondary | ICD-10-CM

## 2021-09-17 DIAGNOSIS — M25512 Pain in left shoulder: Secondary | ICD-10-CM

## 2021-09-17 DIAGNOSIS — M25511 Pain in right shoulder: Secondary | ICD-10-CM

## 2021-09-17 NOTE — Telephone Encounter (Signed)
Patient called with question about the tens unit for her neck which she said is due for replacement, if Dr Aline Brochure can place a new order. States it was from 2011, and that it came from [place] gave ph# [phone] - Please advise.

## 2021-09-17 NOTE — Telephone Encounter (Signed)
Have printed order she wants it mailed to her.

## 2021-10-25 ENCOUNTER — Telehealth: Payer: Self-pay

## 2021-10-25 NOTE — Telephone Encounter (Signed)
[facility] called and are requesting last office note, a statement showing that the tens unit has been ordered and please specify that neck pain is the diagnosis not back pain so insurance will pay for it to be faxed.  Ph: [phone]  Fax: [phone]

## 2021-10-25 NOTE — Telephone Encounter (Signed)
She received the tens unit in 2011 I don't know how to get those records, the new tens order was to replace old tens order  If she wants documentation to send to [facility] she will need appointment  I called her to advise left message she needs appointment   FYI in case she calls back

## 2021-11-01 NOTE — Telephone Encounter (Signed)
Called patient earlier today, no answer or voice mail; will try back.

## 2021-11-02 NOTE — Telephone Encounter (Signed)
Called back to patient ; left message at cell ph#[phone]

## 2021-12-04 DIAGNOSIS — Z96642 Presence of left artificial hip joint: Secondary | ICD-10-CM | POA: Insufficient documentation

## 2022-03-11 ENCOUNTER — Encounter: Payer: Medicare Other | Admitting: Orthopedic Surgery

## 2022-03-11 ENCOUNTER — Ambulatory Visit (INDEPENDENT_AMBULATORY_CARE_PROVIDER_SITE_OTHER): Payer: Medicare Other | Admitting: Orthopedic Surgery

## 2022-03-11 DIAGNOSIS — M25512 Pain in left shoulder: Secondary | ICD-10-CM

## 2022-03-11 DIAGNOSIS — M25511 Pain in right shoulder: Secondary | ICD-10-CM

## 2022-03-11 NOTE — Progress Notes (Signed)
Encounter Diagnosis  ?Name Primary?  ? Pain of both shoulder joints Yes  ? ?Procedure injection right shoulder subacromial joint and left shoulder subacromial joint  ? ?procedure note the subacromial injection shoulder RIGHT  ?Verbal consent was obtained to inject the  RIGHT   Shoulder ? ?Timeout was completed to confirm the injection site is a subacromial space of the  RIGHT  shoulder ? ? ?Medication used Depo-Medrol 40 mg and lidocaine 1% 3 cc ? ?Anesthesia was provided by ethyl chloride ? ?The injection was performed in the RIGHT  posterior subacromial space. After pinning the skin with alcohol and anesthetized the skin with ethyl chloride the subacromial space was injected using a 20-gauge needle. There were no complications ? ?Sterile dressing was applied. ? ? ?Procedure note the subacromial injection shoulder left  ? ?Verbal consent was obtained to inject the  Left   Shoulder ? ?Timeout was completed to confirm the injection site is a subacromial space of the  left  shoulder ? ?Medication used Depo-Medrol 40 mg and lidocaine 1% 3 cc ? ?Anesthesia was provided by ethyl chloride ? ?The injection was performed in the left  posterior subacromial space. After pinning the skin with alcohol and anesthetized the skin with ethyl chloride the subacromial space was injected using a 20-gauge needle. There were no complications ? ?Sterile dressing was applied.  ?

## 2022-07-02 DIAGNOSIS — Z7409 Other reduced mobility: Secondary | ICD-10-CM | POA: Insufficient documentation

## 2022-07-03 ENCOUNTER — Encounter: Payer: Self-pay | Admitting: Orthopedic Surgery

## 2022-07-03 ENCOUNTER — Ambulatory Visit (INDEPENDENT_AMBULATORY_CARE_PROVIDER_SITE_OTHER): Payer: Medicare Other | Admitting: Orthopedic Surgery

## 2022-07-03 VITALS — Ht 60.0 in | Wt 227.0 lb

## 2022-07-03 DIAGNOSIS — R202 Paresthesia of skin: Secondary | ICD-10-CM

## 2022-07-03 DIAGNOSIS — Z01818 Encounter for other preprocedural examination: Secondary | ICD-10-CM

## 2022-07-03 DIAGNOSIS — G5601 Carpal tunnel syndrome, right upper limb: Secondary | ICD-10-CM | POA: Diagnosis not present

## 2022-07-03 NOTE — Patient Instructions (Signed)
Your surgery will be at [facility] by Dr Harrison  The hospital will contact you with a preoperative appointment to discuss Anesthesia.  Please arrive on time or 15 minutes early for the preoperative appointment, they have a very tight schedule if you are late or do not come in your surgery will be cancelled.  The phone number is [phone]. Please bring your medications with you for the appointment. They will tell you the arrival time and medication instructions when you have your preoperative evaluation. Do not wear nail polish the day of your surgery and if you take Phentermine you need to stop this medication ONE WEEK prior to your surgery. If you take Invokana, Farxiga, Jardiance, or Steglatro) - Hold 72 hours before the procedure.  If you take Ozempic,  Bydureon or Trulicity do not take for 8 days before your surgery. If you take Victoza, Rybelsis, Saxenda or Adlyxi stop 24 hours before the procedure.  Please arrive at the hospital 2 hours before procedure if scheduled at 9:30 or later in the day or at the time the nurse tells you at your preoperative visit.   If you have my chart do not use the time given in my chart use the time given to you by the nurse during your preoperative visit.   Your surgery  time may change. Please be available for phone calls the day of your surgery and the day before. The Short Stay department may need to discuss changes about your surgery time. Not reaching the you could lead to procedure delays and possible cancellation.  You must have a ride home and someone to stay with you for 24 to 48 hours. The person taking you home will receive and sign for the your discharge instructions.  Please be prepared to give your support person's name and telephone number to [facility]. Dr Harrison will need that name and phone number post procedure.   

## 2022-07-03 NOTE — Progress Notes (Signed)
Chief Complaint  Patient presents with   Wrist Problem    To discuss B   Knee Pain    To discuss bilateral CTR    66 year old female with chronic pain has bilateral carpal tunnel syndrome has positive nerve conduction study  Patient would like to have right carpal tunnel release  Primarily her symptoms involve the long and ring finger  She has some mild atrophy in the thenar and hypothenar areas   Plan: Impression: The above electrodiagnostic study is ABNORMAL and reveals evidence of a severe bilateral median nerve entrapment at the wrist (carpal tunnel syndrome) affecting sensory and motor components.    There is no significant electrodiagnostic evidence of any other focal nerve entrapment, brachial plexopathy or cervical radiculopathy.   History and physical will be done at a later date it is incorporated by reference  Primary diagnosis right carpal tunnel syndrome scheduled for September 12 follow-up 2 weeks later on Wednesday to have sutures removed

## 2022-07-04 NOTE — Patient Instructions (Signed)
Rachel [name]  07/04/2022     '@PREFPERIOPPHARMACY'$ @   Your procedure is scheduled on  07/09/2022.   Report to [facility] at  [place].M.   Call this number if you have problems the morning of surgery:  [phone]   Remember:  Do not eat or drink after midnight.      Take these medicines the morning of surgery with A SIP OF WATER         xanax(if needed), norvasc, celexa, neurontin, norco (if needed), protonix.     Do not wear jewelry, make-up or nail polish.  Do not wear lotions, powders, or perfumes, or deodorant.  Do not shave 48 hours prior to surgery.  Men may shave face and neck.  Do not bring valuables to the hospital.  [facility] is not responsible for any belongings or valuables.  Contacts, dentures or bridgework may not be worn into surgery.  Leave your suitcase in the car.  After surgery it may be brought to your room.  For patients admitted to the hospital, discharge time will be determined by your treatment team.  Patients discharged the day of surgery will not be allowed to drive home and must have someone with them for 24 hours.    Special instructions:   DO NOT smoke tobacco or vape for 24 hours before your procedure.  Please read over the following fact sheets that you were given. Coughing and Deep Breathing, Surgical Site Infection Prevention, Anesthesia Post-op Instructions, and Care and Recovery After Surgery      Open Carpal Tunnel Release, Care After This sheet gives you information about how to care for yourself after your procedure. Your health care provider may also give you more specific instructions. If you have problems or questions, contact your health care provider. What can I expect after the procedure? After the procedure, it is common to have: Pain. Swelling. Wrist stiffness. Bruising. Follow these instructions at home: Medicines Take over-the-counter and prescription medicines only as told by your health care  provider. Ask your health care provider if the medicine prescribed to you: Requires you to avoid driving or using machinery. Can cause constipation. You may need to take these actions to prevent or treat constipation: Drink enough fluid to keep your urine pale yellow. Take over-the-counter or prescription medicines. Eat foods that are high in fiber, such as beans, whole grains, and fresh fruits and vegetables. Limit foods that are high in fat and processed sugars, such as fried or sweet foods. Bathing Do not take baths, swim, or use a hot tub until your health care provider approves. Ask your health care provider if you may take showers. Keep your bandage (dressing) dry until your health care provider says it can be removed. Cover it with a watertight covering when you take a bath or a shower. If you have a splint or brace: Wear the splint or brace as told by your health care provider. You may need to wear it for 2-3 weeks. Remove it only as told by your health care provider. Loosen the splint or brace if your fingers tingle, become numb, or turn cold and blue. Keep the splint or brace clean. If the splint or brace is not waterproof: Do not let it get wet. Cover it with a watertight covering when you take a bath or a shower. Incision care  After the compression bandage has been removed, follow instructions from your health care provider about  how to take care of your incision. Make sure you: Wash your hands with soap and water for at least 20 seconds before and after you change your bandage (dressing). If soap and water are not available, use hand sanitizer. Change your dressing as told by your health care provider. Leave stitches (sutures), skin glue, or adhesive strips in place. These skin closures may need to stay in place for 2 weeks or longer. If adhesive strip edges start to loosen and curl up, you may trim the loose edges. Do not remove adhesive strips completely unless your health care  provider tells you to do that. Check your incision area every day for signs of infection. Check for: Redness. More swelling or pain. Fluid or blood. Warmth. Pus or a bad smell. Managing pain, stiffness, and swelling  If directed, put ice on the affected area. If you have a removable splint or brace, remove it as told by your health care provider. Put ice in a plastic bag. Place a towel between your skin and the bag. Leave the ice on for 20 minutes, 2-3 times a day. Do not fall asleep with ice pack on your skin. Remove the ice if your skin turns bright red. This is very important. If you cannot feel pain, heat, or cold, you have a greater risk of damage to the area. Move your fingers often to avoid stiffness and to lessen swelling. Raise (elevate) your wrist above the level of your heart while you are sitting or lying down. Activity Do not drive until your health care provider approves. Use your hand carefully. Do not do activities that cause pain. You should be able to do light activities with your hand. Do not lift with your affected hand until your health care provider approves. Avoid pulling and pushing with the injured arm. Return to your normal activities as told by your health care provider. Ask your health care provider what activities are safe for you. If physical therapy was prescribed, do exercises as told by your therapist. Physical therapy can help you heal faster and regain movement. General instructions Do not use any products that contain nicotine or tobacco, such as cigarettes and e-cigarettes. These can delay incision healing after surgery. If you need help quitting, ask your health care provider. Keep all follow-up visits. This is important. These include visits for physical therapy. Contact a health care provider if: You have redness around your incision. You have more swelling or pain. You have fluid or blood coming from your incision. Your incision feels warm to  the touch. You have pus or a bad smell coming from your incision. You have a fever or chills. You have pain that does not get better with medicine. Your carpal tunnel symptoms do not go away after 2 months. Your carpal tunnel symptoms go away and then come back. Get help right away if: You have pain or numbness that is getting worse. Your fingers or fingertips become very pale or bluish in color. You are not able to move your fingers. Summary It is common to have wrist stiffness and bruising after a carpal tunnel release. Icing and raising (elevating) your wrist may help to lessen swelling and pain. Call your health care provider if you have a fever or notice any signs of infection in your incision area. This information is not intended to replace advice given to you by your health care provider. Make sure you discuss any questions you have with your health care provider. Document Revised: 02/17/2020  Document Reviewed: 02/17/2020 Elsevier Patient Education  [place] This sheet gives you information about how to care for yourself after your procedure. Your health care provider may also give you more specific instructions. If you have problems or questions, contact your health care provider. What can I expect after the procedure? After the procedure, it is common to have: Tiredness. Forgetfulness about what happened after the procedure. Impaired judgment for important decisions. Nausea or vomiting. Some difficulty with balance. Follow these instructions at home: For the time period you were told by your health care provider:     Rest as needed. Do not participate in activities where you could fall or become injured. Do not drive or use machinery. Do not drink alcohol. Do not take sleeping pills or medicines that cause drowsiness. Do not make important decisions or sign legal documents. Do not take care of children on your  own. Eating and drinking Follow the diet that is recommended by your health care provider. Drink enough fluid to keep your urine pale yellow. If you vomit: Drink water, juice, or soup when you can drink without vomiting. Make sure you have little or no nausea before eating solid foods. General instructions Have a responsible adult stay with you for the time you are told. It is important to have someone help care for you until you are awake and alert. Take over-the-counter and prescription medicines only as told by your health care provider. If you have sleep apnea, surgery and certain medicines can increase your risk for breathing problems. Follow instructions from your health care provider about wearing your sleep device: Anytime you are sleeping, including during daytime naps. While taking prescription pain medicines, sleeping medicines, or medicines that make you drowsy. Avoid smoking. Keep all follow-up visits as told by your health care provider. This is important. Contact a health care provider if: You keep feeling nauseous or you keep vomiting. You feel light-headed. You are still sleepy or having trouble with balance after 24 hours. You develop a rash. You have a fever. You have redness or swelling around the IV site. Get help right away if: You have trouble breathing. You have new-onset confusion at home. Summary For several hours after your procedure, you may feel tired. You may also be forgetful and have poor judgment. Have a responsible adult stay with you for the time you are told. It is important to have someone help care for you until you are awake and alert. Rest as told. Do not drive or operate machinery. Do not drink alcohol or take sleeping pills. Get help right away if you have trouble breathing, or if you suddenly become confused. This information is not intended to replace advice given to you by your health care provider. Make sure you discuss any questions you  have with your health care provider. Document Revised: 09/18/2021 Document Reviewed: 09/16/2019 Elsevier Patient Education  [place]. How to Use Chlorhexidine Before Surgery Chlorhexidine gluconate (CHG) is a germ-killing (antiseptic) solution that is used to clean the skin. It can get rid of the bacteria that normally live on the skin and can keep them away for about 24 hours. To clean your skin with CHG, you may be given: A CHG solution to use in the shower or as part of a sponge bath. A prepackaged cloth that contains CHG. Cleaning your skin with CHG may help lower the risk for infection: While you are staying in the intensive care unit of  the hospital. If you have a vascular access, such as a central line, to provide short-term or long-term access to your veins. If you have a catheter to drain urine from your bladder. If you are on a ventilator. A ventilator is a machine that helps you breathe by moving air in and out of your lungs. After surgery. What are the risks? Risks of using CHG include: A skin reaction. Hearing loss, if CHG gets in your ears and you have a perforated eardrum. Eye injury, if CHG gets in your eyes and is not rinsed out. The CHG product catching fire. Make sure that you avoid smoking and flames after applying CHG to your skin. Do not use CHG: If you have a chlorhexidine allergy or have previously reacted to chlorhexidine. On babies younger than 41 months of age. How to use CHG solution Use CHG only as told by your health care provider, and follow the instructions on the label. Use the full amount of CHG as directed. Usually, this is one bottle. During a shower Follow these steps when using CHG solution during a shower (unless your health care provider gives you different instructions): Start the shower. Use your normal soap and shampoo to wash your face and hair. Turn off the shower or move out of the shower stream. Pour the CHG onto a clean  washcloth. Do not use any type of brush or rough-edged sponge. Starting at your neck, lather your body down to your toes. Make sure you follow these instructions: If you will be having surgery, pay special attention to the part of your body where you will be having surgery. Scrub this area for at least 1 minute. Do not use CHG on your head or face. If the solution gets into your ears or eyes, rinse them well with water. Avoid your genital area. Avoid any areas of skin that have broken skin, cuts, or scrapes. Scrub your back and under your arms. Make sure to wash skin folds. Let the lather sit on your skin for 1-2 minutes or as long as told by your health care provider. Thoroughly rinse your entire body in the shower. Make sure that all body creases and crevices are rinsed well. Dry off with a clean towel. Do not put any substances on your body afterward--such as powder, lotion, or perfume--unless you are told to do so by your health care provider. Only use lotions that are recommended by the manufacturer. Put on clean clothes or pajamas. If it is the night before your surgery, sleep in clean sheets.  During a sponge bath Follow these steps when using CHG solution during a sponge bath (unless your health care provider gives you different instructions): Use your normal soap and shampoo to wash your face and hair. Pour the CHG onto a clean washcloth. Starting at your neck, lather your body down to your toes. Make sure you follow these instructions: If you will be having surgery, pay special attention to the part of your body where you will be having surgery. Scrub this area for at least 1 minute. Do not use CHG on your head or face. If the solution gets into your ears or eyes, rinse them well with water. Avoid your genital area. Avoid any areas of skin that have broken skin, cuts, or scrapes. Scrub your back and under your arms. Make sure to wash skin folds. Let the lather sit on your skin for  1-2 minutes or as long as told by your health care provider.  Using a different clean, wet washcloth, thoroughly rinse your entire body. Make sure that all body creases and crevices are rinsed well. Dry off with a clean towel. Do not put any substances on your body afterward--such as powder, lotion, or perfume--unless you are told to do so by your health care provider. Only use lotions that are recommended by the manufacturer. Put on clean clothes or pajamas. If it is the night before your surgery, sleep in clean sheets. How to use CHG prepackaged cloths Only use CHG cloths as told by your health care provider, and follow the instructions on the label. Use the CHG cloth on clean, dry skin. Do not use the CHG cloth on your head or face unless your health care provider tells you to. When washing with the CHG cloth: Avoid your genital area. Avoid any areas of skin that have broken skin, cuts, or scrapes. Before surgery Follow these steps when using a CHG cloth to clean before surgery (unless your health care provider gives you different instructions): Using the CHG cloth, vigorously scrub the part of your body where you will be having surgery. Scrub using a back-and-forth motion for 3 minutes. The area on your body should be completely wet with CHG when you are done scrubbing. Do not rinse. Discard the cloth and let the area air-dry. Do not put any substances on the area afterward, such as powder, lotion, or perfume. Put on clean clothes or pajamas. If it is the night before your surgery, sleep in clean sheets.  For general bathing Follow these steps when using CHG cloths for general bathing (unless your health care provider gives you different instructions). Use a separate CHG cloth for each area of your body. Make sure you wash between any folds of skin and between your fingers and toes. Wash your body in the following order, switching to a new cloth after each step: The front of your neck,  shoulders, and chest. Both of your arms, under your arms, and your hands. Your stomach and groin area, avoiding the genitals. Your right leg and foot. Your left leg and foot. The back of your neck, your back, and your buttocks. Do not rinse. Discard the cloth and let the area air-dry. Do not put any substances on your body afterward--such as powder, lotion, or perfume--unless you are told to do so by your health care provider. Only use lotions that are recommended by the manufacturer. Put on clean clothes or pajamas. Contact a health care provider if: Your skin gets irritated after scrubbing. You have questions about using your solution or cloth. You swallow any chlorhexidine. Call your local poison control center (1-940-447-5707 in the [place] Get help right away if: Your eyes itch badly, or they become very red or swollen. Your skin itches badly and is red or swollen. Your hearing changes. You have trouble seeing. You have swelling or tingling in your mouth or throat. You have trouble breathing. These symptoms may represent a serious problem that is an emergency. Do not wait to see if the symptoms will go away. Get medical help right away. Call your local emergency services (911 in the [place] Do not drive yourself to the hospital. Summary Chlorhexidine gluconate (CHG) is a germ-killing (antiseptic) solution that is used to clean the skin. Cleaning your skin with CHG may help to lower your risk for infection. You may be given CHG to use for bathing. It may be in a bottle or in a prepackaged cloth to use on your  skin. Carefully follow your health care provider's instructions and the instructions on the product label. Do not use CHG if you have a chlorhexidine allergy. Contact your health care provider if your skin gets irritated after scrubbing. This information is not intended to replace advice given to you by your health care provider. Make sure you discuss any questions you have with your  health care provider. Document Revised: 02/11/2022 Document Reviewed: 12/25/2020 Elsevier Patient Education  [place].

## 2022-07-05 ENCOUNTER — Encounter (HOSPITAL_COMMUNITY)
Admission: RE | Admit: 2022-07-05 | Discharge: 2022-07-05 | Disposition: A | Discharge status: Home / Self Care | Payer: Medicare Other | Source: Ambulatory Visit | Attending: Orthopedic Surgery | Admitting: Orthopedic Surgery

## 2022-07-05 ENCOUNTER — Encounter (HOSPITAL_COMMUNITY): Payer: Self-pay

## 2022-07-05 DIAGNOSIS — G5601 Carpal tunnel syndrome, right upper limb: Secondary | ICD-10-CM

## 2022-07-05 DIAGNOSIS — Z01812 Encounter for preprocedural laboratory examination: Secondary | ICD-10-CM | POA: Diagnosis present

## 2022-07-05 DIAGNOSIS — Z01818 Encounter for other preprocedural examination: Secondary | ICD-10-CM

## 2022-07-05 LAB — CBC WITH DIFFERENTIAL/PLATELET
Abs Immature Granulocytes: 0.02 10*3/uL (ref 0.00–0.07)
Basophils Absolute: 0 10*3/uL (ref 0.0–0.1)
Basophils Relative: 0 %
Eosinophils Absolute: 0.1 10*3/uL (ref 0.0–0.5)
Eosinophils Relative: 2 %
HCT: 38.2 % (ref 36.0–46.0)
Hemoglobin: 12.8 g/dL (ref 12.0–15.0)
Immature Granulocytes: 0 %
Lymphocytes Relative: 21 %
Lymphs Abs: 1.3 10*3/uL (ref 0.7–4.0)
MCH: 30.3 pg (ref 26.0–34.0)
MCHC: 33.5 g/dL (ref 30.0–36.0)
MCV: 90.5 fL (ref 80.0–100.0)
Monocytes Absolute: 0.6 10*3/uL (ref 0.1–1.0)
Monocytes Relative: 10 %
Neutro Abs: 3.9 10*3/uL (ref 1.7–7.7)
Neutrophils Relative %: 67 %
Platelets: 244 10*3/uL (ref 150–400)
RBC: 4.22 MIL/uL (ref 3.87–5.11)
RDW: 12.8 % (ref 11.5–15.5)
WBC: 5.9 10*3/uL (ref 4.0–10.5)
nRBC: 0 % (ref 0.0–0.2)

## 2022-07-05 LAB — BASIC METABOLIC PANEL
Anion gap: 10 (ref 5–15)
BUN: 13 mg/dL (ref 8–23)
CO2: 22 mmol/L (ref 22–32)
Calcium: 8.7 mg/dL — ABNORMAL LOW (ref 8.9–10.3)
Chloride: 106 mmol/L (ref 98–111)
Creatinine, Ser: 0.78 mg/dL (ref 0.44–1.00)
GFR, Estimated: >60 mL/min (ref >60)
Glucose, Bld: 95 mg/dL (ref 70–99)
Potassium: 3.2 mmol/L — ABNORMAL LOW (ref 3.5–5.1)
Sodium: 138 mmol/L (ref 135–145)

## 2022-07-08 ENCOUNTER — Encounter (HOSPITAL_COMMUNITY): Payer: Self-pay | Admitting: Anesthesiology
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
## 2022-07-09 ENCOUNTER — Telehealth: Payer: Self-pay | Admitting: Radiology

## 2022-07-09 ENCOUNTER — Ambulatory Visit (HOSPITAL_COMMUNITY)
Admission: RE | Admit: 2022-07-09 | Discharge: 2022-07-09 | Disposition: A | Discharge status: Home / Self Care | Payer: Medicare Other | Attending: Orthopedic Surgery | Admitting: Orthopedic Surgery

## 2022-07-09 DIAGNOSIS — G5601 Carpal tunnel syndrome, right upper limb: Secondary | ICD-10-CM

## 2022-07-09 NOTE — Telephone Encounter (Signed)
I called her and she agrees with this plan, have sent message to [facility]

## 2022-07-09 NOTE — Telephone Encounter (Signed)
Fri pm

## 2022-07-09 NOTE — OR Nursing (Signed)
Procedure cancelled due to OR issue with pipes in the ceiling.

## 2022-07-09 NOTE — Telephone Encounter (Signed)
Patient called and asked for return call to reschedule today's surgery that could not be done.

## 2022-07-10 ENCOUNTER — Encounter (HOSPITAL_COMMUNITY)
Admission: RE | Admit: 2022-07-10 | Discharge: 2022-07-10 | Disposition: A | Discharge status: Home / Self Care | Payer: Medicare Other | Source: Home / Self Care | Attending: Orthopedic Surgery | Admitting: Orthopedic Surgery

## 2022-07-12 ENCOUNTER — Encounter (HOSPITAL_COMMUNITY): Payer: Self-pay | Admitting: Orthopedic Surgery

## 2022-07-12 ENCOUNTER — Ambulatory Visit (HOSPITAL_BASED_OUTPATIENT_CLINIC_OR_DEPARTMENT_OTHER): Payer: Medicare Other | Admitting: Anesthesiology

## 2022-07-12 ENCOUNTER — Ambulatory Visit (HOSPITAL_COMMUNITY)
Admission: RE | Admit: 2022-07-12 | Discharge: 2022-07-12 | Disposition: A | Discharge status: Home / Self Care | Payer: Medicare Other | Attending: Orthopedic Surgery | Admitting: Orthopedic Surgery

## 2022-07-12 ENCOUNTER — Encounter (HOSPITAL_COMMUNITY)
Admission: RE | Disposition: A | Discharge status: Home / Self Care | Payer: Self-pay | Source: Home / Self Care | Attending: Orthopedic Surgery

## 2022-07-12 ENCOUNTER — Other Ambulatory Visit: Payer: Self-pay

## 2022-07-12 ENCOUNTER — Ambulatory Visit (HOSPITAL_COMMUNITY): Payer: Medicare Other | Admitting: Anesthesiology

## 2022-07-12 DIAGNOSIS — G5601 Carpal tunnel syndrome, right upper limb: Secondary | ICD-10-CM

## 2022-07-12 DIAGNOSIS — G709 Myoneural disorder, unspecified: Secondary | ICD-10-CM | POA: Insufficient documentation

## 2022-07-12 DIAGNOSIS — F418 Other specified anxiety disorders: Secondary | ICD-10-CM | POA: Diagnosis not present

## 2022-07-12 DIAGNOSIS — I10 Essential (primary) hypertension: Secondary | ICD-10-CM | POA: Insufficient documentation

## 2022-07-12 DIAGNOSIS — M1990 Unspecified osteoarthritis, unspecified site: Secondary | ICD-10-CM | POA: Diagnosis not present

## 2022-07-12 DIAGNOSIS — Z6841 Body Mass Index (BMI) 40.0 and over, adult: Secondary | ICD-10-CM | POA: Insufficient documentation

## 2022-07-12 HISTORY — PX: CARPAL TUNNEL RELEASE: SHX101

## 2022-07-12 SURGERY — CARPAL TUNNEL RELEASE
Anesthesia: Regional | Site: Hand | Laterality: Right

## 2022-07-12 SURGERY — CARPAL TUNNEL RELEASE
Anesthesia: Choice | Laterality: Right

## 2022-07-12 MED ORDER — LACTATED RINGERS IV SOLN: INTRAVENOUS | Status: DC

## 2022-07-12 MED ORDER — ORAL CARE MOUTH RINSE: 15.0000 mL | Freq: Once | OROMUCOSAL | Status: DC

## 2022-07-12 MED ORDER — LIDOCAINE HCL (PF) 0.5 % IJ SOLN
INTRAMUSCULAR | Status: AC
  Filled 2022-07-12: qty 50

## 2022-07-12 MED ORDER — OXYCODONE HCL 5 MG PO TABS
5.0000 mg | ORAL_TABLET | Freq: Once | ORAL | Status: AC
  Administered 2022-07-12: 5 mg via ORAL
  Filled 2022-07-12: qty 1

## 2022-07-12 MED ORDER — BUPIVACAINE HCL (PF) 0.5 % IJ SOLN
INTRAMUSCULAR | Status: DC | PRN
  Administered 2022-07-12: 20 mL

## 2022-07-12 MED ORDER — LIDOCAINE HCL (PF) 0.5 % IJ SOLN
INTRAMUSCULAR | Status: DC | PRN
  Administered 2022-07-12: 50 mL via INTRAVENOUS

## 2022-07-12 MED ORDER — DEXAMETHASONE SODIUM PHOSPHATE 10 MG/ML IJ SOLN
INTRAMUSCULAR | Status: DC | PRN
  Administered 2022-07-12: 5 mg via INTRAVENOUS

## 2022-07-12 MED ORDER — ONDANSETRON HCL 4 MG/2ML IJ SOLN: 4.0000 mg | Freq: Once | INTRAMUSCULAR | Status: DC | PRN

## 2022-07-12 MED ORDER — FENTANYL CITRATE (PF) 100 MCG/2ML IJ SOLN
INTRAMUSCULAR | Status: AC
  Filled 2022-07-12: qty 2

## 2022-07-12 MED ORDER — 0.9 % SODIUM CHLORIDE (POUR BTL) OPTIME
TOPICAL | Status: DC | PRN
  Administered 2022-07-12: 1000 mL

## 2022-07-12 MED ORDER — BUPIVACAINE HCL (PF) 0.5 % IJ SOLN
INTRAMUSCULAR | Status: AC
  Filled 2022-07-12: qty 30

## 2022-07-12 MED ORDER — VANCOMYCIN HCL 1500 MG/300ML IV SOLN
1500.0000 mg | INTRAVENOUS | Status: AC
  Administered 2022-07-12: 1500 mg via INTRAVENOUS
  Filled 2022-07-12: qty 300

## 2022-07-12 MED ORDER — LIDOCAINE HCL (CARDIAC) PF 50 MG/5ML IV SOSY
PREFILLED_SYRINGE | INTRAVENOUS | Status: DC | PRN
  Administered 2022-07-12: 60 mg via INTRAVENOUS

## 2022-07-12 MED ORDER — PROPOFOL 10 MG/ML IV BOLUS
INTRAVENOUS | Status: DC | PRN
  Administered 2022-07-12 (×2): 20 mg via INTRAVENOUS
  Administered 2022-07-12: 10 mg via INTRAVENOUS
  Administered 2022-07-12: 40 mg via INTRAVENOUS

## 2022-07-12 MED ORDER — CHLORHEXIDINE GLUCONATE 0.12 % MT SOLN
15.0000 mL | Freq: Once | OROMUCOSAL | Status: AC
  Administered 2022-07-12: 15 mL via OROMUCOSAL

## 2022-07-12 MED ORDER — FENTANYL CITRATE (PF) 100 MCG/2ML IJ SOLN
INTRAMUSCULAR | Status: DC | PRN
  Administered 2022-07-12 (×2): 50 ug via INTRAVENOUS

## 2022-07-12 MED ORDER — LIDOCAINE HCL (PF) 2 % IJ SOLN
INTRAMUSCULAR | Status: AC
  Filled 2022-07-12: qty 5

## 2022-07-12 MED ORDER — FENTANYL CITRATE PF 50 MCG/ML IJ SOSY
25.0000 ug | PREFILLED_SYRINGE | INTRAMUSCULAR | Status: DC | PRN
  Administered 2022-07-12: 50 ug via INTRAVENOUS
  Filled 2022-07-12: qty 1

## 2022-07-12 MED ORDER — PROPOFOL 500 MG/50ML IV EMUL
INTRAVENOUS | Status: DC | PRN
  Administered 2022-07-12 (×2): 100 ug/kg/min via INTRAVENOUS

## 2022-07-12 MED ORDER — ONDANSETRON HCL 4 MG/2ML IJ SOLN
INTRAMUSCULAR | Status: AC
  Filled 2022-07-12: qty 2

## 2022-07-12 MED ORDER — LIDOCAINE HCL (PF) 1 % IJ SOLN
INTRAMUSCULAR | Status: AC
  Filled 2022-07-12: qty 30

## 2022-07-12 MED ORDER — CHLORHEXIDINE GLUCONATE 0.12 % MT SOLN: 15.0000 mL | Freq: Once | OROMUCOSAL | Status: DC

## 2022-07-12 MED ORDER — ORAL CARE MOUTH RINSE: 15.0000 mL | Freq: Once | OROMUCOSAL | Status: AC

## 2022-07-12 MED ORDER — ONDANSETRON HCL 4 MG/2ML IJ SOLN
INTRAMUSCULAR | Status: DC | PRN
  Administered 2022-07-12: 4 mg via INTRAVENOUS

## 2022-07-12 SURGICAL SUPPLY — 33 items
APL PRP STRL LF DISP 70% ISPRP (MISCELLANEOUS) ×1
BANDAGE ESMARK 4X12 BL STRL LF (DISPOSABLE) ×2 IMPLANT
BLADE SURG 15 STRL LF DISP TIS (BLADE) ×2 IMPLANT
BLADE SURG 15 STRL SS (BLADE) ×1
BNDG CMPR 12X4 ELC STRL LF (DISPOSABLE) ×1
BNDG CMPR STD VLCR NS LF 5.8X3 (GAUZE/BANDAGES/DRESSINGS) ×1
BNDG ELASTIC 3X5.8 VLCR NS LF (GAUZE/BANDAGES/DRESSINGS) ×2 IMPLANT
BNDG ESMARK 4X12 BLUE STRL LF (DISPOSABLE) ×1
BNDG GAUZE ELAST 4 BULKY (GAUZE/BANDAGES/DRESSINGS) ×2 IMPLANT
CHLORAPREP W/TINT 26 (MISCELLANEOUS) ×2 IMPLANT
CLOTH BEACON ORANGE TIMEOUT ST (SAFETY) ×2 IMPLANT
COVER LIGHT HANDLE STERIS (MISCELLANEOUS) ×4 IMPLANT
CUFF TOURN SGL QUICK 18X4 (TOURNIQUET CUFF) ×2 IMPLANT
ELECT REM PT RETURN 9FT ADLT (ELECTROSURGICAL) ×1
ELECTRODE REM PT RTRN 9FT ADLT (ELECTROSURGICAL) ×2 IMPLANT
GAUZE SPONGE 4X4 12PLY STRL (GAUZE/BANDAGES/DRESSINGS) ×2 IMPLANT
GAUZE XEROFORM 1X8 LF (GAUZE/BANDAGES/DRESSINGS) ×2 IMPLANT
GLOVE BIOGEL PI IND STRL 7.0 (GLOVE) ×4 IMPLANT
GLOVE SS N UNI LF 8.5 STRL (GLOVE) ×2 IMPLANT
GLOVE SURG POLYISO LF SZ8 (GLOVE) ×2 IMPLANT
GOWN STRL REUS W/TWL LRG LVL3 (GOWN DISPOSABLE) ×2 IMPLANT
GOWN STRL REUS W/TWL XL LVL3 (GOWN DISPOSABLE) ×2 IMPLANT
KIT TURNOVER KIT A (KITS) ×2 IMPLANT
MANIFOLD NEPTUNE II (INSTRUMENTS) ×2 IMPLANT
NDL HYPO 21X1.5 SAFETY (NEEDLE) ×4 IMPLANT
NEEDLE HYPO 21X1.5 SAFETY (NEEDLE) ×2 IMPLANT
NS IRRIG 1000ML POUR BTL (IV SOLUTION) ×2 IMPLANT
PACK BASIC LIMB (CUSTOM PROCEDURE TRAY) ×2 IMPLANT
PAD ARMBOARD 7.5X6 YLW CONV (MISCELLANEOUS) ×2 IMPLANT
POSITIONER HAND ALUMI XLG (MISCELLANEOUS) ×2 IMPLANT
SET BASIN LINEN APH (SET/KITS/TRAYS/PACK) ×2 IMPLANT
SUT ETHILON 3 0 FSL (SUTURE) ×2 IMPLANT
SYR CONTROL 10ML LL (SYRINGE) ×2 IMPLANT

## 2022-07-12 NOTE — Brief Op Note (Signed)
07/12/2022  1:14 PM  PATIENT:  Rachel [name]  66 y.o. female  PRE-OPERATIVE DIAGNOSIS:  Right carpal tunnel syndrome  POST-OPERATIVE DIAGNOSIS:  Right carpal tunnel syndrome  PROCEDURE:  Procedure(s): CARPAL TUNNEL RELEASE (Right)  SURGEON:  Surgeon(s) and Role:    Carole Civil, MD - Primary   TOURNIQUET:   Total Tourniquet Time Documented: Upper Arm (Right) - 25 minutes Total: Upper Arm (Right) - 25 minutes    PRE-OPERATIVE DIAGNOSIS:  RIGHT  CARPAL TUNNEL SYNDROME  POST-OPERATIVE DIAGNOSIS:  RIGHT CARPAL TUNNEL SYNDROME   FINDINGS: diminutive discolored small nerves with apple core appearance or color with poor light reflection   PROCEDURE:  Procedure(s): RIGHT CARPAL TUNNEL RELEASE RIGHT   SURGEON:  Surgeon(s) and Role:    * Carole Civil, MD - Primary  PHYSICIAN ASSISTANT:   ASSISTANTS: none   ANESTHESIA:   regional Bier block  BLOOD ADMINISTERED:none  DRAINS: none   LOCAL MEDICATIONS USED:  MARCAINE   20 cc  SPECIMEN:  No Specimen  DISPOSITION OF SPECIMEN:  N/A  COUNTS:  YES   DICTATION: .Dragon Dictation   Surgeon Aline Brochure  Indications failure of conservative treatment to relieve pain and paresthesias and numbness and tingling of the RIGHT HAND   The patient was identified in the preop area we confirm the surgical site marked as right wrist. Chart update completed. Patient taken to surgery.  Antibiotic vancomycin 1500 mg after establishing a successful anesthesia the arm was prepped with ChloraPrep  Timeout executed completed and confirmed site. RIGHT WRIST  /  HAND   A straight incision was made over the RIGHT carpal tunnel in line with the radial border of the ring finger. Blunt dissection was carried out to find the distal aspect of the carpal tunnel. A blunted judgment was passed beneath the carpal tunnel. Sharp incision was then used to release the transverse carpal ligament. The contents of the carpal tunnel were  inspected. The median nerve was greater purpleish in color it had poor color, shape was apple core for light reflection  The wound was irrigated and then closed with 3-0 nylon suture. We injected 10 mL of plain Marcaine on the radial side of the incision  A sterile bandage was applied and the tourniquet was released the color of the hand and capillary refill were normal  The patient was taken to the recovery room in stable condition  PLAN OF CARE: Discharge to home after PACU  PATIENT DISPOSITION:  PACU - hemodynamically stable.   Delay start of Pharmacological VTE agent (>24hrs) due to surgical blood loss or risk of bleeding: not applicable

## 2022-07-12 NOTE — Interval H&P Note (Signed)
History and Physical Interval Note:  07/12/2022 12:14 PM  Rachel [name]  has presented today for surgery, with the diagnosis of Right carpal tunnel syndrome.  The various methods of treatment have been discussed with the patient and family. After consideration of risks, benefits and other options for treatment, the patient has consented to  Procedure(s): CARPAL TUNNEL RELEASE (Right) as a surgical intervention.  The patient's history has been reviewed, patient examined, no change in status, stable for surgery.  I have reviewed the patient's chart and labs.  Questions were answered to the patient's satisfaction.     Arther Abbott

## 2022-07-12 NOTE — Transfer of Care (Signed)
Immediate Anesthesia Transfer of Care Note  Patient: Rachel [name]  Procedure(s) Performed: CARPAL TUNNEL RELEASE (Right: Hand)  Patient Location: PACU  Anesthesia Type:Bier block  Level of Consciousness: awake and patient cooperative  Airway & Oxygen Therapy: Patient Spontanous Breathing  Post-op Assessment: Report given to RN and Post -op Vital signs reviewed and stable  Post vital signs: Reviewed and stable  Last Vitals:  Vitals Value Taken Time  BP 171/80 07/12/22 1316  Temp 98 1317  Pulse 70 07/12/22 1317  Resp 11 07/12/22 1317  SpO2 99 % 07/12/22 1317  Vitals shown include unvalidated device data.  Last Pain:  Vitals:   07/12/22 1115  TempSrc: (P) Oral  PainSc: (P) 9          Complications: No notable events documented.

## 2022-07-12 NOTE — Anesthesia Procedure Notes (Signed)
Date/Time: 07/12/2022 12:31 PM  Performed by: Vista Deck, CRNAPre-anesthesia Checklist: Patient identified, Emergency Drugs available, Suction available, Timeout performed and Patient being monitored Patient Re-evaluated:Patient Re-evaluated prior to induction Oxygen Delivery Method: Nasal Cannula

## 2022-07-12 NOTE — Anesthesia Procedure Notes (Signed)
Anesthesia Regional Block: Bier block (IV Regional)   Pre-Anesthetic Checklist: , timeout performed,  Correct Patient, Correct Site, Correct Laterality,  Correct Procedure,, site marked,  Surgical consent,  At surgeon's request  Laterality: Right         Needles:  Injection technique: Single-shot  Needle Type: Other      Needle Gauge: 22     Additional Needles:   Procedures:,,,,, intact distal pulses, Esmarch exsanguination,  Single tourniquet utilized     Nerve Stimulator or Paresthesia:   Additional Responses:  Pulse checked post tourniquet inflation. IV NSL discontinued post injection. Narrative:  Start time: 07/12/2022 12:41 PM End time: 07/12/2022 12:44 PM  Performed by: Personally

## 2022-07-12 NOTE — Anesthesia Preprocedure Evaluation (Addendum)
Anesthesia Evaluation  Patient identified by MRN, date of birth, ID band Patient awake    Reviewed: Allergy & Precautions, NPO status , Patient's Chart, lab work & pertinent test results  Airway Mallampati: II  TM Distance: >3 FB Neck ROM: Full   Comment: Neck pain Dental  (+) Dental Advisory Given, Chipped,    Pulmonary neg pulmonary ROS,    breath sounds clear to auscultation       Cardiovascular hypertension, Pt. on medications Normal cardiovascular exam Rhythm:Regular Rate:Normal     Neuro/Psych PSYCHIATRIC DISORDERS Anxiety Depression  Neuromuscular disease    GI/Hepatic negative GI ROS, Neg liver ROS,   Endo/Other  Morbid obesity  Renal/GU negative Renal ROS  negative genitourinary   Musculoskeletal  (+) Arthritis , Osteoarthritis,    Abdominal   Peds negative pediatric ROS (+)  Hematology negative hematology ROS (+)   Anesthesia Other Findings   Reproductive/Obstetrics negative OB ROS                            Anesthesia Physical Anesthesia Plan  ASA: 3  Anesthesia Plan: Bier Block and Bier Block-LIDOCAINE ONLY   Post-op Pain Management: Minimal or no pain anticipated   Induction: Intravenous  PONV Risk Score and Plan: Ondansetron and Propofol infusion  Airway Management Planned: Nasal Cannula, Natural Airway and Simple Face Mask  Additional Equipment:   Intra-op Plan:   Post-operative Plan:   Informed Consent: I have reviewed the patients History and Physical, chart, labs and discussed the procedure including the risks, benefits and alternatives for the proposed anesthesia with the patient or authorized representative who has indicated his/her understanding and acceptance.     Dental advisory given  Plan Discussed with: CRNA and Surgeon  Anesthesia Plan Comments:         Anesthesia Quick Evaluation

## 2022-07-12 NOTE — Op Note (Signed)
07/12/2022  1:14 PM  PATIENT:  Rachel [name]  66 y.o. female  PRE-OPERATIVE DIAGNOSIS:  Right carpal tunnel syndrome  POST-OPERATIVE DIAGNOSIS:  Right carpal tunnel syndrome  PROCEDURE:  Procedure(s): CARPAL TUNNEL RELEASE (Right)  SURGEON:  Surgeon(s) and Role:    Carole Civil, MD - Primary   TOURNIQUET:   Total Tourniquet Time Documented: Upper Arm (Right) - 25 minutes Total: Upper Arm (Right) - 25 minutes    PRE-OPERATIVE DIAGNOSIS:  RIGHT  CARPAL TUNNEL SYNDROME  POST-OPERATIVE DIAGNOSIS:  RIGHT CARPAL TUNNEL SYNDROME   FINDINGS: diminutive discolored small nerves with apple core appearance or color with poor light reflection   PROCEDURE:  Procedure(s): RIGHT CARPAL TUNNEL RELEASE RIGHT   SURGEON:  Surgeon(s) and Role:    * Carole Civil, MD - Primary  PHYSICIAN ASSISTANT:   ASSISTANTS: none   ANESTHESIA:   regional Bier block  BLOOD ADMINISTERED:none  DRAINS: none   LOCAL MEDICATIONS USED:  MARCAINE   20 cc  SPECIMEN:  No Specimen  DISPOSITION OF SPECIMEN:  N/A  COUNTS:  YES   DICTATION: .Dragon Dictation   Surgeon Aline Brochure  Indications failure of conservative treatment to relieve pain and paresthesias and numbness and tingling of the RIGHT HAND   The patient was identified in the preop area we confirm the surgical site marked as right wrist. Chart update completed. Patient taken to surgery.  Antibiotic vancomycin 1500 mg after establishing a successful anesthesia the arm was prepped with ChloraPrep  Timeout executed completed and confirmed site. RIGHT WRIST  /  HAND   A straight incision was made over the RIGHT carpal tunnel in line with the radial border of the ring finger. Blunt dissection was carried out to find the distal aspect of the carpal tunnel. A blunted judgment was passed beneath the carpal tunnel. Sharp incision was then used to release the transverse carpal ligament. The contents of the carpal tunnel were  inspected. The median nerve was greater purpleish in color it had poor color, shape was apple core for light reflection  The wound was irrigated and then closed with 3-0 nylon suture. We injected 10 mL of plain Marcaine on the radial side of the incision  A sterile bandage was applied and the tourniquet was released the color of the hand and capillary refill were normal  The patient was taken to the recovery room in stable condition  PLAN OF CARE: Discharge to home after PACU  PATIENT DISPOSITION:  PACU - hemodynamically stable.   Delay start of Pharmacological VTE agent (>24hrs) due to surgical blood loss or risk of bleeding: not applicable

## 2022-07-13 NOTE — Anesthesia Postprocedure Evaluation (Signed)
Anesthesia Post Note  Patient: Rachel [name]  Procedure(s) Performed: CARPAL TUNNEL RELEASE (Right: Hand)  Patient location during evaluation: Phase II Anesthesia Type: Bier Block Level of consciousness: awake and alert, oriented and sedated Pain management: pain level controlled Vital Signs Assessment: post-procedure vital signs reviewed and stable Respiratory status: spontaneous breathing, nonlabored ventilation and respiratory function stable Cardiovascular status: stable and blood pressure returned to baseline Postop Assessment: no apparent nausea or vomiting Anesthetic complications: no   No notable events documented.   Last Vitals:  Vitals:   07/12/22 1345 07/12/22 1355  BP: (!) 170/72 (!) 170/72  Pulse: 76 71  Resp: 16 17  Temp:  36.8 C  SpO2: 98% 98%    Last Pain:  Vitals:   07/12/22 1355  TempSrc: Oral  PainSc: 1                  Teren Zurcher C Tamitha Norell

## 2022-07-17 ENCOUNTER — Encounter (HOSPITAL_COMMUNITY): Payer: Self-pay | Admitting: Orthopedic Surgery

## 2022-07-24 ENCOUNTER — Encounter: Payer: Medicare Other | Admitting: Orthopedic Surgery

## 2022-07-26 ENCOUNTER — Ambulatory Visit (INDEPENDENT_AMBULATORY_CARE_PROVIDER_SITE_OTHER): Payer: Medicare Other | Admitting: Orthopedic Surgery

## 2022-07-26 ENCOUNTER — Encounter: Payer: Self-pay | Admitting: Orthopedic Surgery

## 2022-07-26 DIAGNOSIS — Z9889 Other specified postprocedural states: Secondary | ICD-10-CM

## 2022-07-26 NOTE — Progress Notes (Signed)
Chief Complaint  Patient presents with   Post-op Follow-up    Right carpal tunnel release 07/12/22 sutures removed patient tolerated well   Suture removal  Long finger tip a little numb  "It feels so much better"  Return 2 weeks   Skin glue applied   Work on ROM

## 2022-08-02 ENCOUNTER — Telehealth: Payer: Self-pay | Admitting: Radiology

## 2022-08-02 ENCOUNTER — Other Ambulatory Visit: Payer: Self-pay | Admitting: Orthopedic Surgery

## 2022-08-02 MED ORDER — IBUPROFEN 800 MG PO TABS: 800.0000 mg | ORAL_TABLET | Freq: Three times a day (TID) | ORAL | 1 refills | Status: DC | PRN

## 2022-08-02 NOTE — Telephone Encounter (Signed)
I called patient, advised IBU sent to pharmacy, continue ice/elev and NO ace bandage.

## 2022-08-02 NOTE — Telephone Encounter (Signed)
Take off ace bandage

## 2022-08-02 NOTE — Telephone Encounter (Signed)
Patient called, said she is having a ton of swelling still in her fingers.  She took ace off, left it off overnight.  She has iced, elevated, still no relief from swelling.  Wants to know if you can call in an anti inflammatory med for her.  [employer].

## 2022-08-02 NOTE — Progress Notes (Signed)
Meds ordered this encounter  Medications   ibuprofen (ADVIL) 800 MG tablet    Sig: Take 1 tablet (800 mg total) by mouth every 8 (eight) hours as needed.    Dispense:  90 tablet    Refill:  1    

## 2022-08-09 ENCOUNTER — Ambulatory Visit (INDEPENDENT_AMBULATORY_CARE_PROVIDER_SITE_OTHER): Payer: Medicare Other | Admitting: Orthopedic Surgery

## 2022-08-09 DIAGNOSIS — Z9889 Other specified postprocedural states: Secondary | ICD-10-CM

## 2022-08-09 DIAGNOSIS — G5602 Carpal tunnel syndrome, left upper limb: Secondary | ICD-10-CM

## 2022-08-09 DIAGNOSIS — Z01818 Encounter for other preprocedural examination: Secondary | ICD-10-CM

## 2022-08-09 NOTE — Patient Instructions (Signed)
Your surgery will be at [facility] by Dr Harrison  The hospital will contact you with a preoperative appointment to discuss Anesthesia.  Please arrive on time or 15 minutes early for the preoperative appointment, they have a very tight schedule if you are late or do not come in your surgery will be cancelled.  The phone number is [phone]. Please bring your medications with you for the appointment. They will tell you the arrival time and medication instructions when you have your preoperative evaluation. Do not wear nail polish the day of your surgery and if you take Phentermine you need to stop this medication ONE WEEK prior to your surgery. If you take Invokana, Farxiga, Jardiance, or Steglatro) - Hold 72 hours before the procedure.  If you take Ozempic,  Bydureon or Trulicity do not take for 8 days before your surgery. If you take Victoza, Rybelsis, Saxenda or Adlyxi stop 24 hours before the procedure.  Please arrive at the hospital 2 hours before procedure if scheduled at 9:30 or later in the day or at the time the nurse tells you at your preoperative visit.   If you have my chart do not use the time given in my chart use the time given to you by the nurse during your preoperative visit.   Your surgery  time may change. Please be available for phone calls the day of your surgery and the day before. The Short Stay department may need to discuss changes about your surgery time. Not reaching the you could lead to procedure delays and possible cancellation.  You must have a ride home and someone to stay with you for 24 to 48 hours. The person taking you home will receive and sign for the your discharge instructions.  Please be prepared to give your support person's name and telephone number to [facility]. Dr Harrison will need that name and phone number post procedure.   

## 2022-08-09 NOTE — Progress Notes (Unsigned)
POST OP   Chief Complaint  Patient presents with   Follow-up    Recheck on right CTR, DOS 07-12-22.    Encounter Diagnosis  Name Primary?   S/P carpal tunnel release RIGHT  Yes    Suture removal  Wound looks clean.  Patient a little bit stiff but looks really good only a little MP joint stiffness  Patient is inquiring about left carpal tunnel release  The entire study was positive so we can proceed with that the main problem right now is the index long and ring finger on the left hand  Plan for surgery at her convenience  The procedure has been fully reviewed with the patient; The risks and benefits of surgery have been discussed and explained and understood. Alternative treatment has also been reviewed, questions were encouraged and answered. The postoperative plan is also been reviewed.

## 2022-08-14 NOTE — Patient Instructions (Signed)
Rachel [name]  08/14/2022     '@PREFPERIOPPHARMACY'$ @   Your procedure is scheduled on  08/20/2022.   Report to [facility] at  [place] [facility]   Call this number if you have problems the morning of surgery:  [phone]  If you experience any cold or flu symptoms such as cough, fever, chills, shortness of breath, etc. between now and your scheduled surgery, please notify us at the above number.   Remember:  Do not eat or drink after midnight.      Take these medicines the morning of surgery with A SIP OF WATER          Xanax (if needed), amlodipine, citalopram, gabapentin, hydrocodone (if needed), antivert (if needed), robaxin (if needed), protonix.     Do not wear jewelry, make-up or nail polish.  Do not wear lotions, powders, or perfumes, or deodorant.  Do not shave 48 hours prior to surgery.  Men may shave face and neck.  Do not bring valuables to the hospital.  [facility] is not responsible for any belongings or valuables.  Contacts, dentures or bridgework may not be worn into surgery.  Leave your suitcase in the car.  After surgery it may be brought to your room.  For patients admitted to the hospital, discharge time will be determined by your treatment team.  Patients discharged the day of surgery will not be allowed to drive home and must have someone with them for 24 hours.    Special instructions:   DO NOT smoke tobacco or vape for 24 hours before your procedure.  Please read over the following fact sheets that you were given. Coughing and Deep Breathing, Surgical Site Infection Prevention, Anesthesia Post-op Instructions, and Care and Recovery After Surgery      Open Carpal Tunnel Release, Care After This sheet gives you information about how to care for yourself after your procedure. Your health care provider may also give you more specific instructions. If you have problems or questions, contact your health care provider. What can I expect  after the procedure? After the procedure, it is common to have: Pain. Swelling. Wrist stiffness. Bruising. Follow these instructions at home: Medicines Take over-the-counter and prescription medicines only as told by your health care provider. Ask your health care provider if the medicine prescribed to you: Requires you to avoid driving or using machinery. Can cause constipation. You may need to take these actions to prevent or treat constipation: Drink enough fluid to keep your urine pale yellow. Take over-the-counter or prescription medicines. Eat foods that are high in fiber, such as beans, whole grains, and fresh fruits and vegetables. Limit foods that are high in fat and processed sugars, such as fried or sweet foods. Bathing Do not take baths, swim, or use a hot tub until your health care provider approves. Ask your health care provider if you may take showers. Keep your bandage (dressing) dry until your health care provider says it can be removed. Cover it with a watertight covering when you take a bath or a shower. If you have a splint or brace: Wear the splint or brace as told by your health care provider. You may need to wear it for 2-3 weeks. Remove it only as told by your health care provider. Loosen the splint or brace if your fingers tingle, become numb, or turn cold and blue. Keep the splint or brace clean. If the splint or brace is not waterproof:  Do not let it get wet. Cover it with a watertight covering when you take a bath or a shower. Incision care  After the compression bandage has been removed, follow instructions from your health care provider about how to take care of your incision. Make sure you: Wash your hands with soap and water for at least 20 seconds before and after you change your bandage (dressing). If soap and water are not available, use hand sanitizer. Change your dressing as told by your health care provider. Leave stitches (sutures), skin glue, or  adhesive strips in place. These skin closures may need to stay in place for 2 weeks or longer. If adhesive strip edges start to loosen and curl up, you may trim the loose edges. Do not remove adhesive strips completely unless your health care provider tells you to do that. Check your incision area every day for signs of infection. Check for: Redness. More swelling or pain. Fluid or blood. Warmth. Pus or a bad smell. Managing pain, stiffness, and swelling  If directed, put ice on the affected area. If you have a removable splint or brace, remove it as told by your health care provider. Put ice in a plastic bag. Place a towel between your skin and the bag. Leave the ice on for 20 minutes, 2-3 times a day. Do not fall asleep with ice pack on your skin. Remove the ice if your skin turns bright red. This is very important. If you cannot feel pain, heat, or cold, you have a greater risk of damage to the area. Move your fingers often to avoid stiffness and to lessen swelling. Raise (elevate) your wrist above the level of your heart while you are sitting or lying down. Activity Do not drive until your health care provider approves. Use your hand carefully. Do not do activities that cause pain. You should be able to do light activities with your hand. Do not lift with your affected hand until your health care provider approves. Avoid pulling and pushing with the injured arm. Return to your normal activities as told by your health care provider. Ask your health care provider what activities are safe for you. If physical therapy was prescribed, do exercises as told by your therapist. Physical therapy can help you heal faster and regain movement. General instructions Do not use any products that contain nicotine or tobacco, such as cigarettes and e-cigarettes. These can delay incision healing after surgery. If you need help quitting, ask your health care provider. Keep all follow-up visits. This is  important. These include visits for physical therapy. Contact a health care provider if: You have redness around your incision. You have more swelling or pain. You have fluid or blood coming from your incision. Your incision feels warm to the touch. You have pus or a bad smell coming from your incision. You have a fever or chills. You have pain that does not get better with medicine. Your carpal tunnel symptoms do not go away after 2 months. Your carpal tunnel symptoms go away and then come back. Get help right away if: You have pain or numbness that is getting worse. Your fingers or fingertips become very pale or bluish in color. You are not able to move your fingers. Summary It is common to have wrist stiffness and bruising after a carpal tunnel release. Icing and raising (elevating) your wrist may help to lessen swelling and pain. Call your health care provider if you have a fever or notice any signs  of infection in your incision area. This information is not intended to replace advice given to you by your health care provider. Make sure you discuss any questions you have with your health care provider. Document Revised: 02/17/2020 Document Reviewed: 02/17/2020 Elsevier Patient Education  [place] After This sheet gives you information about how to care for yourself after your procedure. Your health care provider may also give you more specific instructions. If you have problems or questions, contact your health care provider. What can I expect after the procedure? After the procedure, it is common to have: Tiredness. Forgetfulness about what happened after the procedure. Impaired judgment for important decisions. Nausea or vomiting. Some difficulty with balance. Follow these instructions at home: For the time period you were told by your health care provider:     Rest as needed. Do not participate in activities where you could fall  or become injured. Do not drive or use machinery. Do not drink alcohol. Do not take sleeping pills or medicines that cause drowsiness. Do not make important decisions or sign legal documents. Do not take care of children on your own. Eating and drinking Follow the diet that is recommended by your health care provider. Drink enough fluid to keep your urine pale yellow. If you vomit: Drink water, juice, or soup when you can drink without vomiting. Make sure you have little or no nausea before eating solid foods. General instructions Have a responsible adult stay with you for the time you are told. It is important to have someone help care for you until you are awake and alert. Take over-the-counter and prescription medicines only as told by your health care provider. If you have sleep apnea, surgery and certain medicines can increase your risk for breathing problems. Follow instructions from your health care provider about wearing your sleep device: Anytime you are sleeping, including during daytime naps. While taking prescription pain medicines, sleeping medicines, or medicines that make you drowsy. Avoid smoking. Keep all follow-up visits as told by your health care provider. This is important. Contact a health care provider if: You keep feeling nauseous or you keep vomiting. You feel light-headed. You are still sleepy or having trouble with balance after 24 hours. You develop a rash. You have a fever. You have redness or swelling around the IV site. Get help right away if: You have trouble breathing. You have new-onset confusion at home. Summary For several hours after your procedure, you may feel tired. You may also be forgetful and have poor judgment. Have a responsible adult stay with you for the time you are told. It is important to have someone help care for you until you are awake and alert. Rest as told. Do not drive or operate machinery. Do not drink alcohol or take sleeping  pills. Get help right away if you have trouble breathing, or if you suddenly become confused. This information is not intended to replace advice given to you by your health care provider. Make sure you discuss any questions you have with your health care provider. Document Revised: 09/18/2021 Document Reviewed: 09/16/2019 Elsevier Patient Education  [place]. How to Use Chlorhexidine Before Surgery Chlorhexidine gluconate (CHG) is a germ-killing (antiseptic) solution that is used to clean the skin. It can get rid of the bacteria that normally live on the skin and can keep them away for about 24 hours. To clean your skin with CHG, you may be given: A CHG solution to use  in the shower or as part of a sponge bath. A prepackaged cloth that contains CHG. Cleaning your skin with CHG may help lower the risk for infection: While you are staying in the intensive care unit of the hospital. If you have a vascular access, such as a central line, to provide short-term or long-term access to your veins. If you have a catheter to drain urine from your bladder. If you are on a ventilator. A ventilator is a machine that helps you breathe by moving air in and out of your lungs. After surgery. What are the risks? Risks of using CHG include: A skin reaction. Hearing loss, if CHG gets in your ears and you have a perforated eardrum. Eye injury, if CHG gets in your eyes and is not rinsed out. The CHG product catching fire. Make sure that you avoid smoking and flames after applying CHG to your skin. Do not use CHG: If you have a chlorhexidine allergy or have previously reacted to chlorhexidine. On babies younger than 72 months of age. How to use CHG solution Use CHG only as told by your health care provider, and follow the instructions on the label. Use the full amount of CHG as directed. Usually, this is one bottle. During a shower Follow these steps when using CHG solution during a shower (unless  your health care provider gives you different instructions): Start the shower. Use your normal soap and shampoo to wash your face and hair. Turn off the shower or move out of the shower stream. Pour the CHG onto a clean washcloth. Do not use any type of brush or rough-edged sponge. Starting at your neck, lather your body down to your toes. Make sure you follow these instructions: If you will be having surgery, pay special attention to the part of your body where you will be having surgery. Scrub this area for at least 1 minute. Do not use CHG on your head or face. If the solution gets into your ears or eyes, rinse them well with water. Avoid your genital area. Avoid any areas of skin that have broken skin, cuts, or scrapes. Scrub your back and under your arms. Make sure to wash skin folds. Let the lather sit on your skin for 1-2 minutes or as long as told by your health care provider. Thoroughly rinse your entire body in the shower. Make sure that all body creases and crevices are rinsed well. Dry off with a clean towel. Do not put any substances on your body afterward--such as powder, lotion, or perfume--unless you are told to do so by your health care provider. Only use lotions that are recommended by the manufacturer. Put on clean clothes or pajamas. If it is the night before your surgery, sleep in clean sheets.  During a sponge bath Follow these steps when using CHG solution during a sponge bath (unless your health care provider gives you different instructions): Use your normal soap and shampoo to wash your face and hair. Pour the CHG onto a clean washcloth. Starting at your neck, lather your body down to your toes. Make sure you follow these instructions: If you will be having surgery, pay special attention to the part of your body where you will be having surgery. Scrub this area for at least 1 minute. Do not use CHG on your head or face. If the solution gets into your ears or eyes,  rinse them well with water. Avoid your genital area. Avoid any areas of skin that have  broken skin, cuts, or scrapes. Scrub your back and under your arms. Make sure to wash skin folds. Let the lather sit on your skin for 1-2 minutes or as long as told by your health care provider. Using a different clean, wet washcloth, thoroughly rinse your entire body. Make sure that all body creases and crevices are rinsed well. Dry off with a clean towel. Do not put any substances on your body afterward--such as powder, lotion, or perfume--unless you are told to do so by your health care provider. Only use lotions that are recommended by the manufacturer. Put on clean clothes or pajamas. If it is the night before your surgery, sleep in clean sheets. How to use CHG prepackaged cloths Only use CHG cloths as told by your health care provider, and follow the instructions on the label. Use the CHG cloth on clean, dry skin. Do not use the CHG cloth on your head or face unless your health care provider tells you to. When washing with the CHG cloth: Avoid your genital area. Avoid any areas of skin that have broken skin, cuts, or scrapes. Before surgery Follow these steps when using a CHG cloth to clean before surgery (unless your health care provider gives you different instructions): Using the CHG cloth, vigorously scrub the part of your body where you will be having surgery. Scrub using a back-and-forth motion for 3 minutes. The area on your body should be completely wet with CHG when you are done scrubbing. Do not rinse. Discard the cloth and let the area air-dry. Do not put any substances on the area afterward, such as powder, lotion, or perfume. Put on clean clothes or pajamas. If it is the night before your surgery, sleep in clean sheets.  For general bathing Follow these steps when using CHG cloths for general bathing (unless your health care provider gives you different instructions). Use a separate CHG  cloth for each area of your body. Make sure you wash between any folds of skin and between your fingers and toes. Wash your body in the following order, switching to a new cloth after each step: The front of your neck, shoulders, and chest. Both of your arms, under your arms, and your hands. Your stomach and groin area, avoiding the genitals. Your right leg and foot. Your left leg and foot. The back of your neck, your back, and your buttocks. Do not rinse. Discard the cloth and let the area air-dry. Do not put any substances on your body afterward--such as powder, lotion, or perfume--unless you are told to do so by your health care provider. Only use lotions that are recommended by the manufacturer. Put on clean clothes or pajamas. Contact a health care provider if: Your skin gets irritated after scrubbing. You have questions about using your solution or cloth. You swallow any chlorhexidine. Call your local poison control center (1-8017588127 in the [place] Get help right away if: Your eyes itch badly, or they become very red or swollen. Your skin itches badly and is red or swollen. Your hearing changes. You have trouble seeing. You have swelling or tingling in your mouth or throat. You have trouble breathing. These symptoms may represent a serious problem that is an emergency. Do not wait to see if the symptoms will go away. Get medical help right away. Call your local emergency services (911 in the [place] Do not drive yourself to the hospital. Summary Chlorhexidine gluconate (CHG) is a germ-killing (antiseptic) solution that is used to clean the  skin. Cleaning your skin with CHG may help to lower your risk for infection. You may be given CHG to use for bathing. It may be in a bottle or in a prepackaged cloth to use on your skin. Carefully follow your health care provider's instructions and the instructions on the product label. Do not use CHG if you have a chlorhexidine allergy. Contact  your health care provider if your skin gets irritated after scrubbing. This information is not intended to replace advice given to you by your health care provider. Make sure you discuss any questions you have with your health care provider. Document Revised: 02/11/2022 Document Reviewed: 12/25/2020 Elsevier Patient Education  [place].

## 2022-08-16 ENCOUNTER — Encounter (HOSPITAL_COMMUNITY): Payer: Self-pay

## 2022-08-16 ENCOUNTER — Encounter (HOSPITAL_COMMUNITY)
Admission: RE | Admit: 2022-08-16 | Discharge: 2022-08-16 | Disposition: A | Discharge status: Home / Self Care | Payer: Medicaid Other | Source: Ambulatory Visit | Attending: Orthopedic Surgery | Admitting: Orthopedic Surgery

## 2022-08-16 DIAGNOSIS — Z01818 Encounter for other preprocedural examination: Secondary | ICD-10-CM | POA: Diagnosis not present

## 2022-08-16 DIAGNOSIS — G5602 Carpal tunnel syndrome, left upper limb: Secondary | ICD-10-CM | POA: Diagnosis not present

## 2022-08-16 LAB — BASIC METABOLIC PANEL
Anion gap: 8 (ref 5–15)
BUN: 16 mg/dL (ref 8–23)
CO2: 25 mmol/L (ref 22–32)
Calcium: 9.3 mg/dL (ref 8.9–10.3)
Chloride: 105 mmol/L (ref 98–111)
Creatinine, Ser: 0.74 mg/dL (ref 0.44–1.00)
GFR, Estimated: >60 mL/min (ref >60)
Glucose, Bld: 89 mg/dL (ref 70–99)
Potassium: 3.7 mmol/L (ref 3.5–5.1)
Sodium: 138 mmol/L (ref 135–145)
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
## 2022-08-20 ENCOUNTER — Ambulatory Visit (HOSPITAL_COMMUNITY)
Admission: RE | Admit: 2022-08-20 | Discharge: 2022-08-20 | Disposition: A | Discharge status: Home / Self Care | Payer: Medicare Other | Attending: Orthopedic Surgery | Admitting: Orthopedic Surgery

## 2022-08-20 ENCOUNTER — Encounter (HOSPITAL_COMMUNITY): Payer: Self-pay | Admitting: Orthopedic Surgery

## 2022-08-20 ENCOUNTER — Encounter (HOSPITAL_COMMUNITY)
Admission: RE | Disposition: A | Discharge status: Home / Self Care | Payer: Self-pay | Source: Home / Self Care | Attending: Orthopedic Surgery

## 2022-08-20 ENCOUNTER — Ambulatory Visit (HOSPITAL_BASED_OUTPATIENT_CLINIC_OR_DEPARTMENT_OTHER): Payer: Medicare Other

## 2022-08-20 ENCOUNTER — Ambulatory Visit (HOSPITAL_COMMUNITY): Payer: Medicare Other

## 2022-08-20 DIAGNOSIS — M1990 Unspecified osteoarthritis, unspecified site: Secondary | ICD-10-CM | POA: Insufficient documentation

## 2022-08-20 DIAGNOSIS — G5602 Carpal tunnel syndrome, left upper limb: Secondary | ICD-10-CM

## 2022-08-20 DIAGNOSIS — Z6841 Body Mass Index (BMI) 40.0 and over, adult: Secondary | ICD-10-CM

## 2022-08-20 DIAGNOSIS — G709 Myoneural disorder, unspecified: Secondary | ICD-10-CM | POA: Insufficient documentation

## 2022-08-20 DIAGNOSIS — G8929 Other chronic pain: Secondary | ICD-10-CM | POA: Diagnosis not present

## 2022-08-20 DIAGNOSIS — G5603 Carpal tunnel syndrome, bilateral upper limbs: Secondary | ICD-10-CM | POA: Diagnosis present

## 2022-08-20 DIAGNOSIS — I10 Essential (primary) hypertension: Secondary | ICD-10-CM

## 2022-08-20 DIAGNOSIS — F418 Other specified anxiety disorders: Secondary | ICD-10-CM

## 2022-08-20 HISTORY — PX: CARPAL TUNNEL RELEASE: SHX101

## 2022-08-20 SURGERY — CARPAL TUNNEL RELEASE
Anesthesia: General | Site: Hand | Laterality: Left

## 2022-08-20 MED ORDER — BUPIVACAINE HCL (PF) 0.5 % IJ SOLN
INTRAMUSCULAR | Status: DC | PRN
  Administered 2022-08-20: 15 mL

## 2022-08-20 MED ORDER — ONDANSETRON HCL 4 MG/2ML IJ SOLN
INTRAMUSCULAR | Status: AC
  Filled 2022-08-20: qty 2

## 2022-08-20 MED ORDER — BUPIVACAINE HCL (PF) 0.5 % IJ SOLN
INTRAMUSCULAR | Status: AC
  Filled 2022-08-20: qty 30

## 2022-08-20 MED ORDER — CHLORHEXIDINE GLUCONATE 0.12 % MT SOLN
OROMUCOSAL | Status: AC
  Filled 2022-08-20: qty 15

## 2022-08-20 MED ORDER — DEXAMETHASONE SODIUM PHOSPHATE 10 MG/ML IJ SOLN
INTRAMUSCULAR | Status: DC | PRN
  Administered 2022-08-20: 10 mg via INTRAVENOUS

## 2022-08-20 MED ORDER — FENTANYL CITRATE (PF) 100 MCG/2ML IJ SOLN
INTRAMUSCULAR | Status: AC
  Filled 2022-08-20: qty 2

## 2022-08-20 MED ORDER — FENTANYL CITRATE (PF) 100 MCG/2ML IJ SOLN
INTRAMUSCULAR | Status: DC | PRN
  Administered 2022-08-20 (×2): 25 ug via INTRAVENOUS

## 2022-08-20 MED ORDER — PROPOFOL 10 MG/ML IV BOLUS
INTRAVENOUS | Status: DC | PRN
  Administered 2022-08-20: 200 mg via INTRAVENOUS

## 2022-08-20 MED ORDER — ONDANSETRON HCL 4 MG/2ML IJ SOLN: 4.0000 mg | Freq: Once | INTRAMUSCULAR | Status: DC | PRN

## 2022-08-20 MED ORDER — ESMOLOL HCL 100 MG/10ML IV SOLN
INTRAVENOUS | Status: AC
  Filled 2022-08-20: qty 10

## 2022-08-20 MED ORDER — DEXAMETHASONE SODIUM PHOSPHATE 10 MG/ML IJ SOLN
INTRAMUSCULAR | Status: AC
  Filled 2022-08-20: qty 1

## 2022-08-20 MED ORDER — GLYCOPYRROLATE 0.2 MG/ML IJ SOLN
INTRAMUSCULAR | Status: DC | PRN
  Administered 2022-08-20: .1 mg via INTRAVENOUS

## 2022-08-20 MED ORDER — 0.9 % SODIUM CHLORIDE (POUR BTL) OPTIME
TOPICAL | Status: DC | PRN
  Administered 2022-08-20: 1000 mL

## 2022-08-20 MED ORDER — PROPOFOL 10 MG/ML IV BOLUS
INTRAVENOUS | Status: AC
  Filled 2022-08-20: qty 20

## 2022-08-20 MED ORDER — HYDROCODONE-ACETAMINOPHEN 7.5-325 MG PO TABS: 1.0000 | ORAL_TABLET | Freq: Once | ORAL | Status: DC | PRN

## 2022-08-20 MED ORDER — FENTANYL CITRATE PF 50 MCG/ML IJ SOSY
25.0000 ug | PREFILLED_SYRINGE | INTRAMUSCULAR | Status: DC | PRN
  Administered 2022-08-20 (×2): 50 ug via INTRAVENOUS
  Filled 2022-08-20 (×2): qty 1

## 2022-08-20 MED ORDER — DEXMEDETOMIDINE HCL IN NACL 80 MCG/20ML IV SOLN
INTRAVENOUS | Status: AC
  Filled 2022-08-20: qty 40

## 2022-08-20 MED ORDER — LACTATED RINGERS IV SOLN: INTRAVENOUS | Status: DC

## 2022-08-20 MED ORDER — LIDOCAINE HCL (CARDIAC) PF 100 MG/5ML IV SOSY
PREFILLED_SYRINGE | INTRAVENOUS | Status: DC | PRN
  Administered 2022-08-20: 100 mg via INTRAVENOUS

## 2022-08-20 MED ORDER — GLYCOPYRROLATE PF 0.2 MG/ML IJ SOSY
PREFILLED_SYRINGE | INTRAMUSCULAR | Status: AC
  Filled 2022-08-20: qty 1

## 2022-08-20 MED ORDER — ONDANSETRON HCL 4 MG/2ML IJ SOLN
INTRAMUSCULAR | Status: DC | PRN
  Administered 2022-08-20: 4 mg via INTRAVENOUS

## 2022-08-20 MED ORDER — OXYCODONE HCL 5 MG PO TABS
5.0000 mg | ORAL_TABLET | Freq: Once | ORAL | Status: DC
  Filled 2022-08-20: qty 1

## 2022-08-20 MED ORDER — LIDOCAINE HCL (PF) 2 % IJ SOLN
INTRAMUSCULAR | Status: AC
  Filled 2022-08-20: qty 5

## 2022-08-20 SURGICAL SUPPLY — 34 items
APL PRP STRL LF DISP 70% ISPRP (MISCELLANEOUS) ×1
BANDAGE ESMARK 4X12 BL STRL LF (DISPOSABLE) ×2 IMPLANT
BLADE SURG 15 STRL LF DISP TIS (BLADE) ×2 IMPLANT
BLADE SURG 15 STRL SS (BLADE) ×1
BNDG CMPR 12X4 ELC STRL LF (DISPOSABLE) ×1
BNDG CMPR STD VLCR NS LF 5.8X3 (GAUZE/BANDAGES/DRESSINGS) ×1
BNDG ELASTIC 3X5.8 VLCR NS LF (GAUZE/BANDAGES/DRESSINGS) ×2 IMPLANT
BNDG ESMARK 4X12 BLUE STRL LF (DISPOSABLE) ×1
BNDG GAUZE ELAST 4 BULKY (GAUZE/BANDAGES/DRESSINGS) ×2 IMPLANT
CHLORAPREP W/TINT 26 (MISCELLANEOUS) ×2 IMPLANT
CLOTH BEACON ORANGE TIMEOUT ST (SAFETY) ×2 IMPLANT
COVER LIGHT HANDLE STERIS (MISCELLANEOUS) ×4 IMPLANT
CUFF TOURN SGL QUICK 18X4 (TOURNIQUET CUFF) ×2 IMPLANT
ELECT REM PT RETURN 9FT ADLT (ELECTROSURGICAL) ×1
ELECTRODE REM PT RTRN 9FT ADLT (ELECTROSURGICAL) ×2 IMPLANT
GAUZE SPONGE 4X4 12PLY STRL (GAUZE/BANDAGES/DRESSINGS) ×2 IMPLANT
GAUZE XEROFORM 1X8 LF (GAUZE/BANDAGES/DRESSINGS) ×2 IMPLANT
GLOVE BIOGEL PI IND STRL 7.0 (GLOVE) ×4 IMPLANT
GLOVE SS N UNI LF 8.5 STRL (GLOVE) ×2 IMPLANT
GLOVE SURG POLYISO LF SZ8 (GLOVE) ×2 IMPLANT
GLOVE SURG SS PI 7.0 STRL IVOR (GLOVE) IMPLANT
GOWN STRL REUS W/TWL LRG LVL3 (GOWN DISPOSABLE) ×2 IMPLANT
GOWN STRL REUS W/TWL XL LVL3 (GOWN DISPOSABLE) ×2 IMPLANT
KIT TURNOVER KIT A (KITS) ×2 IMPLANT
MANIFOLD NEPTUNE II (INSTRUMENTS) ×2 IMPLANT
NDL HYPO 21X1.5 SAFETY (NEEDLE) ×2 IMPLANT
NEEDLE HYPO 21X1.5 SAFETY (NEEDLE) ×1 IMPLANT
NS IRRIG 1000ML POUR BTL (IV SOLUTION) ×2 IMPLANT
PACK BASIC LIMB (CUSTOM PROCEDURE TRAY) ×2 IMPLANT
PAD ARMBOARD 7.5X6 YLW CONV (MISCELLANEOUS) ×2 IMPLANT
POSITIONER HAND ALUMI XLG (MISCELLANEOUS) ×2 IMPLANT
SET BASIN LINEN APH (SET/KITS/TRAYS/PACK) ×2 IMPLANT
SUT ETHILON 3 0 FSL (SUTURE) ×2 IMPLANT
SYR CONTROL 10ML LL (SYRINGE) ×2 IMPLANT

## 2022-08-20 NOTE — Transfer of Care (Signed)
Immediate Anesthesia Transfer of Care Note  Patient: Rachel [name]  Procedure(s) Performed: CARPAL TUNNEL RELEASE (Left: Hand)  Patient Location: PACU  Anesthesia Type:General  Level of Consciousness: awake, alert , oriented and patient cooperative  Airway & Oxygen Therapy: Patient Spontanous Breathing and Patient connected to nasal cannula oxygen  Post-op Assessment: Report given to RN, Post -op Vital signs reviewed and stable and Patient moving all extremities  Post vital signs: Reviewed and stable  Last Vitals:  Vitals Value Taken Time  BP    Temp    Pulse    Resp 18 08/20/22 1250  SpO2    Vitals shown include unvalidated device data.  Last Pain:  Vitals:   08/20/22 1100  PainSc: 10-Worst pain ever         Complications: No notable events documented.

## 2022-08-20 NOTE — Op Note (Signed)
  The patient was extubated and taken to recovery room in stable condition   SURGEON:  Surgeon(s) and Role:    * Carole Civil, MD - Primary  PHYSICIAN ASSISTANT:   ASSISTANTS: none   ANESTHESIA:   local and general  EBL:  none   BLOOD ADMINISTERED:none  DRAINS: none   LOCAL MEDICATIONS USED:  MARCAINE     SPECIMEN:  No Specimen  DISPOSITION OF SPECIMEN:  N/A  COUNTS:  YES  TOURNIQUET:   Total Tourniquet Time Documented: Upper Arm (Left) - 15 minutes Total: Upper Arm (Left) - 15 minutes   DICTATION: .Viviann Spare Dictation  PLAN OF CARE: Discharge to home after PACU  PATIENT DISPOSITION:  PACU - hemodynamically stable.   Delay start of Pharmacological VTE agent (>24hrs) due to surgical blood loss or risk of bleeding: not applicable

## 2022-08-20 NOTE — Brief Op Note (Signed)
08/20/2022  12:37 PM  PATIENT:  Rachel [name]  66 y.o. female  PRE-OPERATIVE DIAGNOSIS:  left carpal tunnel syndrome  POST-OPERATIVE DIAGNOSIS:  left carpal tunnel syndrome  PROCEDURE:  Procedure(s): CARPAL TUNNEL RELEASE (Left)   Findings  I thought the nerve looked rather small in its overall diameter the color was okay.  Compression was noted throughout the course of the transverse carpal ligament  Details of procedure  The patient was taken to the operating for general anesthesia she was in the supine position.  We used an armboard and laid out the left hand with the elbow extended.  Tourniquet was applied to the left upper extremity.  The arm was prepped and draped sterilely timeout was completed.  The Marcaine plain was used to anesthetize the skin.  The limb was exsanguinated with a 4 inch Esmarch the tourniquet pressure was 250 mmHg  The hand was then supinated and held with a aluminum hand holder  I marked the incision in line with the radial aspect of the ring finger and took it to the distal transverse wrist crease.  The incision was made to the end of the carpal tunnel and the subcutaneous tissue was divided.  Blunt dissection was carried out until we found the distal aspect of the transverse carpal ligament.  We passed a blunt instrument under the ligament and then divided the ligament sharply.  The carpal tunnel contents were then inspected found to be free of any lesion and then the nerve was inspected.  As stated it had a small diameter it was noted to be compressed in the area of the transverse carpal ligament  We irrigated the wound and closed with interrupted 3-0 nylon sutures  We injected additional Marcaine plain and then applied a sterile dressing.  We let the tourniquet down.  The hand had good color capillary refill and vascularity.  The patient was extubated and taken to recovery room in stable condition   SURGEON:  Surgeon(s) and Role:    *  Carole Civil, MD - Primary  PHYSICIAN ASSISTANT:   ASSISTANTS: none   ANESTHESIA:   local and general  EBL:  none   BLOOD ADMINISTERED:none  DRAINS: none   LOCAL MEDICATIONS USED:  MARCAINE     SPECIMEN:  No Specimen  DISPOSITION OF SPECIMEN:  N/A  COUNTS:  YES  TOURNIQUET:   Total Tourniquet Time Documented: Upper Arm (Left) - 15 minutes Total: Upper Arm (Left) - 15 minutes   DICTATION: .Viviann Spare Dictation  PLAN OF CARE: Discharge to home after PACU  PATIENT DISPOSITION:  PACU - hemodynamically stable.   Delay start of Pharmacological VTE agent (>24hrs) due to surgical blood loss or risk of bleeding: not applicable

## 2022-08-20 NOTE — Anesthesia Procedure Notes (Signed)
Procedure Name: LMA Insertion Date/Time: 08/20/2022 12:06 PM  Performed by: Camillia Herter, RNPre-anesthesia Checklist: Patient identified, Patient being monitored, Emergency Drugs available, Timeout performed and Suction available Patient Re-evaluated:Patient Re-evaluated prior to induction Oxygen Delivery Method: Circle System Utilized Preoxygenation: Pre-oxygenation with 100% oxygen Induction Type: IV induction Ventilation: Mask ventilation without difficulty LMA: LMA inserted LMA Size: 4.0 Number of attempts: 1 Placement Confirmation: positive ETCO2 and breath sounds checked- equal and bilateral Tube secured with: Tape Dental Injury: Teeth and Oropharynx as per pre-operative assessment

## 2022-08-20 NOTE — Progress Notes (Signed)
Instructed on incentive spirometer. 750 ml obtained. Tolerated well.

## 2022-08-20 NOTE — Anesthesia Preprocedure Evaluation (Signed)
Anesthesia Evaluation  Patient identified by MRN, date of birth, ID band Patient awake    Reviewed: Allergy & Precautions, NPO status , Patient's Chart, lab work & pertinent test results  Airway Mallampati: II  TM Distance: >3 FB Neck ROM: Full   Comment: Neck pain Dental  (+) Dental Advisory Given, Chipped,    Pulmonary neg pulmonary ROS,    breath sounds clear to auscultation       Cardiovascular hypertension, Pt. on medications Normal cardiovascular exam Rhythm:Regular Rate:Normal     Neuro/Psych PSYCHIATRIC DISORDERS Anxiety Depression  Neuromuscular disease    GI/Hepatic negative GI ROS, Neg liver ROS,   Endo/Other  Morbid obesity  Renal/GU negative Renal ROS  negative genitourinary   Musculoskeletal  (+) Arthritis , Osteoarthritis,    Abdominal   Peds negative pediatric ROS (+)  Hematology negative hematology ROS (+)   Anesthesia Other Findings   Reproductive/Obstetrics negative OB ROS                             Anesthesia Physical  Anesthesia Plan  ASA: 3  Anesthesia Plan: General   Post-op Pain Management: Minimal or no pain anticipated   Induction: Intravenous  PONV Risk Score and Plan: Propofol infusion  Airway Management Planned: Nasal Cannula, Natural Airway and Simple Face Mask  Additional Equipment:   Intra-op Plan:   Post-operative Plan:   Informed Consent: I have reviewed the patients History and Physical, chart, labs and discussed the procedure including the risks, benefits and alternatives for the proposed anesthesia with the patient or authorized representative who has indicated his/her understanding and acceptance.     Dental advisory given  Plan Discussed with: CRNA and Surgeon  Anesthesia Plan Comments:         Anesthesia Quick Evaluation

## 2022-08-20 NOTE — Interval H&P Note (Signed)
History and Physical Interval Note:  08/20/2022 11:49 AM  Rachel [name]  has presented today for surgery, with the diagnosis of left carpal tunnel syndrome.  The various methods of treatment have been discussed with the patient and family. After consideration of risks, benefits and other options for treatment, the patient has consented to  Procedure(s): CARPAL TUNNEL RELEASE (Left) as a surgical intervention.  The patient's history has been reviewed, patient examined, no change in status, stable for surgery.  I have reviewed the patient's chart and labs.  Questions were answered to the patient's satisfaction.     Arther Abbott

## 2022-08-22 NOTE — Anesthesia Postprocedure Evaluation (Signed)
Anesthesia Post Note  Patient: Rachel [name]  Procedure(s) Performed: CARPAL TUNNEL RELEASE (Left: Hand)  Patient location during evaluation: Phase II Anesthesia Type: General Level of consciousness: awake Pain management: pain level controlled Vital Signs Assessment: post-procedure vital signs reviewed and stable Respiratory status: spontaneous breathing and respiratory function stable Cardiovascular status: blood pressure returned to baseline and stable Postop Assessment: no headache and no apparent nausea or vomiting Anesthetic complications: no Comments: Late entry   No notable events documented.   Last Vitals:  Vitals:   08/20/22 1345 08/20/22 1400  BP: (!) 177/70 (!) 172/77  Pulse: 75 70  Resp: 12 14  Temp:    SpO2: 93% 94%    Last Pain:  Vitals:   08/20/22 1400  PainSc: [place] Aamira Bischoff

## 2022-08-23 ENCOUNTER — Encounter (HOSPITAL_COMMUNITY): Payer: Self-pay | Admitting: Orthopedic Surgery

## 2022-09-04 ENCOUNTER — Encounter: Payer: Medicare Other | Admitting: Orthopedic Surgery

## 2022-09-05 ENCOUNTER — Ambulatory Visit (INDEPENDENT_AMBULATORY_CARE_PROVIDER_SITE_OTHER): Payer: Medicare Other | Admitting: Orthopedic Surgery

## 2022-09-05 ENCOUNTER — Encounter: Payer: Medicare Other | Admitting: Orthopedic Surgery

## 2022-09-05 ENCOUNTER — Encounter: Payer: Self-pay | Admitting: Orthopedic Surgery

## 2022-09-05 DIAGNOSIS — Z9889 Other specified postprocedural states: Secondary | ICD-10-CM

## 2022-09-05 NOTE — Progress Notes (Signed)
Chief Complaint  Patient presents with   Post-op Follow-up    leftt carpal tunnel release 08/20/22    2-week postop visit for the left carpal tunnel release  We took the stitches out the distal portion of the wound separated some we put skin seal and Steri-Strips on  She says the fingers are still a little bit numb whereas on the opposite side she got complete relief and complete wound healing  Return in 3 weeks to check the incision

## 2022-09-22 ENCOUNTER — Other Ambulatory Visit: Payer: Self-pay | Admitting: Orthopedic Surgery

## 2022-09-26 ENCOUNTER — Ambulatory Visit (INDEPENDENT_AMBULATORY_CARE_PROVIDER_SITE_OTHER): Payer: Medicare Other | Admitting: Orthopedic Surgery

## 2022-09-26 DIAGNOSIS — M65312 Trigger thumb, left thumb: Secondary | ICD-10-CM

## 2022-09-26 DIAGNOSIS — Z9889 Other specified postprocedural states: Secondary | ICD-10-CM

## 2022-09-26 MED ORDER — METHYLPREDNISOLONE ACETATE 40 MG/ML IJ SUSP
40.0000 mg | Freq: Once | INTRAMUSCULAR | Status: AC
  Administered 2022-09-26: 40 mg via INTRA_ARTICULAR

## 2022-09-26 NOTE — Progress Notes (Signed)
Chief Complaint  Patient presents with   Post-op Follow-up    Hands left 08/20/22 right 07/12/22     Postop visit.  Patient had bilateral carpal tunnel releases did well.  She is got some pain over the left thumb with locking and catching consistent with trigger thumb  She has tenderness over the A1 pulley and we will inject that area  Left Trigger thumb injection Medication  1 mL of 40 mg Depo-Medrol  2 mL of 1% lidocaine plain  Ethyl chloride for anesthesia  Verbal consent was obtained timeout was taken to confirm the injection site as left thumb  Alcohol was used to prepare the skin along with ethyl chloride and then the injection was made at the A1 pulley there were no complications   Patient will apply Vaseline to the skin area to soften up the wound on the left hand and follow-up as needed

## 2022-09-26 NOTE — Addendum Note (Signed)
Addended by: Elizabeth Sauer on: 09/26/2022 11:17 AM   Modules accepted: Orders

## 2022-09-26 NOTE — Patient Instructions (Signed)
You have received an injection of steroids into the A1 pulley of the left thumb 15% of patients will have increased pain within the 24 hours postinjection.   This is transient and will go away.   We recommend that you use ice packs on the injection site for 20 minutes every 2 hours and extra strength Tylenol 2 tablets every 8 as needed until the pain resolves.  If you continue to have pain after taking the Tylenol and using the ice please call the office for further instructions.

## 2022-11-12 ENCOUNTER — Emergency Department (HOSPITAL_COMMUNITY)
Admission: EM | Admit: 2022-11-12 | Discharge: 2022-11-12 | Disposition: A | Discharge status: Home / Self Care | Payer: Medicare Other | Attending: Emergency Medicine | Admitting: Emergency Medicine

## 2022-11-12 ENCOUNTER — Other Ambulatory Visit: Payer: Self-pay

## 2022-11-12 ENCOUNTER — Encounter (HOSPITAL_COMMUNITY): Payer: Self-pay | Admitting: Emergency Medicine

## 2022-11-12 DIAGNOSIS — M5442 Lumbago with sciatica, left side: Secondary | ICD-10-CM | POA: Insufficient documentation

## 2022-11-12 DIAGNOSIS — M545 Low back pain, unspecified: Secondary | ICD-10-CM | POA: Diagnosis present

## 2022-11-12 MED ORDER — PREDNISONE 50 MG PO TABS
60.0000 mg | ORAL_TABLET | Freq: Once | ORAL | Status: AC
  Administered 2022-11-12: 60 mg via ORAL
  Filled 2022-11-12: qty 1

## 2022-11-12 MED ORDER — PREDNISONE 10 MG (21) PO TBPK: ORAL_TABLET | ORAL | 0 refills | Status: DC

## 2022-11-12 MED ORDER — KETOROLAC TROMETHAMINE 30 MG/ML IJ SOLN
30.0000 mg | Freq: Once | INTRAMUSCULAR | Status: AC
  Administered 2022-11-12: 30 mg via INTRAMUSCULAR
  Filled 2022-11-12: qty 1

## 2022-11-12 NOTE — ED Triage Notes (Signed)
Pt c/o left lower back pain that radiates down her left leg. Pt states this started last Tuesday. Pt takes chronic 10-325 hydrocodone at home at [facility].

## 2022-11-12 NOTE — ED Provider Notes (Signed)
[facility] EMERGENCY DEPARTMENT  Provider Note  CSN: [id number] Arrival date & time: 11/12/22 4332  History Chief Complaint  Patient presents with   Back Pain    Rachel [name] is a 67 y.o. female with history of arthritis, chronic pain on Norco 10/325 at home here via EMS for evaluation of L lower back pain radiating into her leg, worsening over the last few days. No fever, no falls, no incontinence. She has never had a formal diagnosis of sciatica.    Home Medications Prior to Admission medications   Medication Sig Start Date End Date Taking? Authorizing Provider  predniSONE (STERAPRED UNI-PAK 21 TAB) 10 MG (21) TBPK tablet '10mg'$  Tabs, 6 day taper. Use as directed 11/12/22  Yes Truddie Hidden, MD  ALPRAZolam Duanne Moron) 1 MG tablet Take 1 mg by mouth 3 (three) times daily.    [provider]  amLODipine (NORVASC) 10 MG tablet Take 10 mg by mouth every morning.     [provider]  Camphor-Menthol-Methyl Sal (SALONPAS) 3.11-02-08 % PTCH Place 1 patch onto the skin daily as needed (knee pain).    [provider]  Cholecalciferol (VITAMIN D3) 1.25 MG (50000 UT) CAPS Take 5,000 Units by mouth once a week. 06/24/22   [provider]  citalopram (CELEXA) 20 MG tablet Take 20 mg by mouth every morning.    [provider]  cloNIDine (CATAPRES) 0.1 MG tablet Take 0.1 mg by mouth 2 (two) times daily.    [provider]  cyanocobalamin (VITAMIN B12) 1000 MCG tablet Take 1,000 mcg by mouth daily.    [provider]  gabapentin (NEURONTIN) 300 MG capsule Take 300 mg by mouth 3 (three) times daily. 01/19/19   [provider]  HYDROcodone-acetaminophen (NORCO) 10-325 MG tablet Take 1 tablet by mouth every 6 (six) hours as needed for moderate pain. 06/09/22   [provider]  ibuprofen (ADVIL) 800 MG tablet TAKE 1 TABLET EVERY 8 HOURS AS NEEDED. 09/23/22   Carole Civil, MD  ketoconazole (NIZORAL) 2 % cream APPLY TO  AFFECTED AREA DAILY. Patient not taking: Reported on 08/15/2022 02/10/19   Landis Martins, DPM  meclizine (ANTIVERT) 25 MG tablet Take 1 tablet (25 mg total) by mouth 3 (three) times daily as needed for dizziness. Patient not taking: Reported on 08/15/2022 02/08/19   Davonna Belling, MD  methocarbamol (ROBAXIN) 500 MG tablet Take 1 tablet (500 mg total) by mouth every 8 (eight) hours as needed for muscle spasms. Patient not taking: Reported on 08/15/2022 09/15/21   Ezequiel Essex, MD  naproxen (NAPROSYN) 500 MG tablet Take 1 tablet (500 mg total) by mouth 2 (two) times daily with a meal. Patient not taking: Reported on 08/09/2022 09/15/21   Ezequiel Essex, MD  pantoprazole (PROTONIX) 40 MG tablet Take 1 tablet (40 mg total) by mouth daily. Patient taking differently: Take 40 mg by mouth daily as needed (acid reflux). 07/31/20   Triplett, Tammy, PA-C  pramipexole (MIRAPEX) 0.5 MG tablet Take 1 mg by mouth at bedtime. 08/06/22   [provider]  pravastatin (PRAVACHOL) 40 MG tablet Take 40 mg by mouth at bedtime. 08/21/21   [provider]  promethazine (PHENERGAN) 12.5 MG tablet Take 12.5 mg by mouth every 6 (six) hours as needed for vomiting or nausea. 02/15/22   [provider]  famotidine (PEPCID) 20 MG tablet Take 20 mg by mouth 2 (two) times daily.  01/16/12  [provider]     Allergies  Lisinopril, Penicillins, Povidone-iodine, and Betadine [povidone iodine]   Review of Systems   Review of Systems Please see HPI for pertinent positives and negatives  Physical Exam BP (!) 159/72 (BP Location: Right Arm)   Pulse 89   Temp 98.3 F (36.8 C) (Oral)   Resp (!) 22   Ht 5' (1.524 m)   Wt 103 kg   SpO2 99%   BMI 44.35 kg/m   Physical Exam Vitals and nursing note reviewed.  HENT:     Head: Normocephalic.     Nose: Nose normal.  Eyes:     Extraocular Movements: Extraocular movements intact.  Pulmonary:     Effort: Pulmonary effort is  normal.  Musculoskeletal:        General: Tenderness (L lumbar paraspinal muscles, no midline tenderness) present. Normal range of motion.     Cervical back: Neck supple.  Skin:    Findings: No rash (on exposed skin).  Neurological:     Mental Status: She is alert and oriented to person, place, and time.     Sensory: No sensory deficit.     Motor: No weakness.     Deep Tendon Reflexes: Reflexes normal.  Psychiatric:        Mood and Affect: Mood normal.     ED Results / Procedures / Treatments   EKG None  Procedures Procedures  Medications Ordered in the ED Medications  ketorolac (TORADOL) 30 MG/ML injection 30 mg (has no administration in time range)  predniSONE (DELTASONE) tablet 60 mg (has no administration in time range)    Initial Impression and Plan  Patient here with symptoms of L sided sciatica. Already has Norco and Robaxin at home. Will give a dose of Toradol here for pain and begin a course of steroids. Ortho follow up if not improving.   ED Course       MDM Rules/Calculators/A&P Medical Decision Making Problems Addressed: Acute left-sided low back pain with left-sided sciatica: acute illness or injury  Risk Prescription drug management.    Final Clinical Impression(s) / ED Diagnoses Final diagnoses:  Acute left-sided low back pain with left-sided sciatica    Rx / DC Orders ED Discharge Orders          Ordered    predniSONE (STERAPRED UNI-PAK 21 TAB) 10 MG (21) TBPK tablet        11/12/22 8182             Truddie Hidden, MD 11/12/22 [phone]

## 2022-11-13 ENCOUNTER — Encounter (HOSPITAL_COMMUNITY): Payer: Self-pay | Admitting: Emergency Medicine

## 2022-11-13 ENCOUNTER — Other Ambulatory Visit: Payer: Self-pay

## 2022-11-13 ENCOUNTER — Emergency Department (HOSPITAL_COMMUNITY): Payer: Medicare Other

## 2022-11-13 ENCOUNTER — Emergency Department (HOSPITAL_COMMUNITY)
Admission: EM | Admit: 2022-11-13 | Discharge: 2022-11-13 | Disposition: A | Discharge status: Home / Self Care | Payer: Medicare Other | Attending: Emergency Medicine | Admitting: Emergency Medicine

## 2022-11-13 DIAGNOSIS — N200 Calculus of kidney: Secondary | ICD-10-CM | POA: Diagnosis not present

## 2022-11-13 DIAGNOSIS — R1032 Left lower quadrant pain: Secondary | ICD-10-CM | POA: Diagnosis present

## 2022-11-13 DIAGNOSIS — Z96643 Presence of artificial hip joint, bilateral: Secondary | ICD-10-CM | POA: Insufficient documentation

## 2022-11-13 DIAGNOSIS — R197 Diarrhea, unspecified: Secondary | ICD-10-CM | POA: Diagnosis not present

## 2022-11-13 DIAGNOSIS — I10 Essential (primary) hypertension: Secondary | ICD-10-CM | POA: Insufficient documentation

## 2022-11-13 DIAGNOSIS — Z1152 Encounter for screening for COVID-19: Secondary | ICD-10-CM | POA: Insufficient documentation

## 2022-11-13 DIAGNOSIS — R112 Nausea with vomiting, unspecified: Secondary | ICD-10-CM | POA: Diagnosis not present

## 2022-11-13 DIAGNOSIS — R000 Tachycardia, unspecified: Secondary | ICD-10-CM | POA: Diagnosis not present

## 2022-11-13 DIAGNOSIS — I700 Atherosclerosis of aorta: Secondary | ICD-10-CM | POA: Insufficient documentation

## 2022-11-13 DIAGNOSIS — I3139 Other pericardial effusion (noninflammatory): Secondary | ICD-10-CM | POA: Diagnosis not present

## 2022-11-13 DIAGNOSIS — K449 Diaphragmatic hernia without obstruction or gangrene: Secondary | ICD-10-CM | POA: Diagnosis not present

## 2022-11-13 DIAGNOSIS — I119 Hypertensive heart disease without heart failure: Secondary | ICD-10-CM | POA: Diagnosis not present

## 2022-11-13 DIAGNOSIS — Z79899 Other long term (current) drug therapy: Secondary | ICD-10-CM | POA: Insufficient documentation

## 2022-11-13 LAB — CBC WITH DIFFERENTIAL/PLATELET
Abs Immature Granulocytes: 0.04 10*3/uL (ref 0.00–0.07)
Basophils Absolute: 0 10*3/uL (ref 0.0–0.1)
Basophils Relative: 0 %
Eosinophils Absolute: 0 10*3/uL (ref 0.0–0.5)
Eosinophils Relative: 0 %
HCT: 35 % — ABNORMAL LOW (ref 36.0–46.0)
Hemoglobin: 12.3 g/dL (ref 12.0–15.0)
Immature Granulocytes: 0 %
Lymphocytes Relative: 9 %
Lymphs Abs: 0.8 10*3/uL (ref 0.7–4.0)
MCH: 30.9 pg (ref 26.0–34.0)
MCHC: 35.1 g/dL (ref 30.0–36.0)
MCV: 87.9 fL (ref 80.0–100.0)
Monocytes Absolute: 0.7 10*3/uL (ref 0.1–1.0)
Monocytes Relative: 8 %
Neutro Abs: 7.5 10*3/uL (ref 1.7–7.7)
Neutrophils Relative %: 83 %
Platelets: 308 10*3/uL (ref 150–400)
RBC: 3.98 MIL/uL (ref 3.87–5.11)
RDW: 12.9 % (ref 11.5–15.5)
WBC: 9.1 10*3/uL (ref 4.0–10.5)
nRBC: 0 % (ref 0.0–0.2)

## 2022-11-13 LAB — URINALYSIS, ROUTINE W REFLEX MICROSCOPIC
Bilirubin Urine: NEGATIVE
Glucose, UA: NEGATIVE mg/dL
Ketones, ur: NEGATIVE mg/dL
Leukocytes,Ua: NEGATIVE
Nitrite: NEGATIVE
Protein, ur: >=300 mg/dL — AB
Specific Gravity, Urine: 1.013 (ref 1.005–1.030)
pH: 8 (ref 5.0–8.0)

## 2022-11-13 LAB — COMPREHENSIVE METABOLIC PANEL
ALT: 14 U/L (ref 0–44)
AST: 23 U/L (ref 15–41)
Albumin: 3.4 g/dL — ABNORMAL LOW (ref 3.5–5.0)
Alkaline Phosphatase: 69 U/L (ref 38–126)
Anion gap: 9 (ref 5–15)
BUN: 21 mg/dL (ref 8–23)
CO2: 23 mmol/L (ref 22–32)
Calcium: 9.1 mg/dL (ref 8.9–10.3)
Chloride: 103 mmol/L (ref 98–111)
Creatinine, Ser: 0.89 mg/dL (ref 0.44–1.00)
GFR, Estimated: >60 mL/min (ref >60)
Glucose, Bld: 175 mg/dL — ABNORMAL HIGH (ref 70–99)
Potassium: 3.7 mmol/L (ref 3.5–5.1)
Sodium: 135 mmol/L (ref 135–145)
Total Bilirubin: 0.6 mg/dL (ref 0.3–1.2)
Total Protein: 7.1 g/dL (ref 6.5–8.1)

## 2022-11-13 LAB — RESP PANEL BY RT-PCR (RSV, FLU A&B, COVID)  RVPGX2
Influenza A by PCR: NEGATIVE
Influenza B by PCR: NEGATIVE
Resp Syncytial Virus by PCR: NEGATIVE
SARS Coronavirus 2 by RT PCR: NEGATIVE

## 2022-11-13 LAB — LIPASE, BLOOD: Lipase: 41 U/L (ref 11–51)

## 2022-11-13 MED ORDER — CLONIDINE HCL 0.1 MG PO TABS
0.1000 mg | ORAL_TABLET | Freq: Once | ORAL | Status: AC
  Administered 2022-11-13: 0.1 mg via ORAL
  Filled 2022-11-13: qty 1

## 2022-11-13 MED ORDER — KETOROLAC TROMETHAMINE 15 MG/ML IJ SOLN
15.0000 mg | Freq: Once | INTRAMUSCULAR | Status: AC
  Administered 2022-11-13: 15 mg via INTRAVENOUS
  Filled 2022-11-13: qty 1

## 2022-11-13 MED ORDER — LABETALOL HCL 5 MG/ML IV SOLN
10.0000 mg | Freq: Once | INTRAVENOUS | Status: AC
  Administered 2022-11-13: 10 mg via INTRAVENOUS
  Filled 2022-11-13: qty 4

## 2022-11-13 MED ORDER — AMLODIPINE BESYLATE 5 MG PO TABS
10.0000 mg | ORAL_TABLET | Freq: Once | ORAL | Status: AC
  Administered 2022-11-13: 10 mg via ORAL
  Filled 2022-11-13: qty 2

## 2022-11-13 MED ORDER — IOHEXOL 300 MG/ML  SOLN
100.0000 mL | Freq: Once | INTRAMUSCULAR | Status: AC | PRN
  Administered 2022-11-13: 100 mL via INTRAVENOUS

## 2022-11-13 MED ORDER — ONDANSETRON HCL 4 MG/2ML IJ SOLN
4.0000 mg | Freq: Once | INTRAMUSCULAR | Status: AC
  Administered 2022-11-13: 4 mg via INTRAVENOUS
  Filled 2022-11-13: qty 2

## 2022-11-13 MED ORDER — IBUPROFEN 600 MG PO TABS: 600.0000 mg | ORAL_TABLET | Freq: Four times a day (QID) | ORAL | 0 refills | Status: AC | PRN

## 2022-11-13 MED ORDER — MORPHINE SULFATE (PF) 4 MG/ML IV SOLN
4.0000 mg | Freq: Once | INTRAVENOUS | Status: AC
  Administered 2022-11-13: 4 mg via INTRAVENOUS
  Filled 2022-11-13: qty 1

## 2022-11-13 MED ORDER — SODIUM CHLORIDE 0.9 % IV BOLUS
1000.0000 mL | Freq: Once | INTRAVENOUS | Status: AC
  Administered 2022-11-13: 1000 mL via INTRAVENOUS

## 2022-11-13 MED ORDER — ONDANSETRON HCL 4 MG PO TABS: 4.0000 mg | ORAL_TABLET | Freq: Four times a day (QID) | ORAL | 0 refills | Status: DC

## 2022-11-13 NOTE — ED Notes (Signed)
Pt disconnected from monitoring so she can use BSC in room again.

## 2022-11-13 NOTE — Discharge Instructions (Addendum)
We evaluated you for your abdominal pain.  Your pain is probably due to a passed kidney stone.  I have prescribed you medication to take for nausea and pain at home.  Please continue to take your Norco medication that you are prescribed.  Please only take Motrin for 5 days as it can cause stomach problems and kidney problems for someone your age if you take it for a long time.  Please come back to the emergency department if you develop any persistent vomiting, worsening pain, fevers, painful urination, or any other concerning symptoms.

## 2022-11-13 NOTE — ED Triage Notes (Signed)
Pt was seen yesterday in the ED for nausea, vomiting and diarrhea. Pt is back today with the same complaints accompanied with flank pain radiating down the left side. Pt takes hydrocodone for chronic pain last taken around 0100
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
## 2022-11-14 LAB — URINE CULTURE

## 2022-11-19 ENCOUNTER — Encounter (HOSPITAL_COMMUNITY): Payer: Self-pay | Admitting: Emergency Medicine

## 2022-11-19 ENCOUNTER — Emergency Department (HOSPITAL_COMMUNITY)
Admission: EM | Admit: 2022-11-19 | Discharge: 2022-11-19 | Discharge status: Against Medical Advice | Payer: Medicare Other | Source: Home / Self Care

## 2022-11-19 ENCOUNTER — Other Ambulatory Visit: Payer: Self-pay

## 2022-11-19 ENCOUNTER — Emergency Department (HOSPITAL_COMMUNITY)
Admission: EM | Admit: 2022-11-19 | Discharge: 2022-11-19 | Disposition: A | Discharge status: Home / Self Care | Payer: Medicare Other | Attending: Emergency Medicine | Admitting: Emergency Medicine

## 2022-11-19 DIAGNOSIS — M545 Low back pain, unspecified: Secondary | ICD-10-CM | POA: Diagnosis present

## 2022-11-19 DIAGNOSIS — M5442 Lumbago with sciatica, left side: Secondary | ICD-10-CM | POA: Insufficient documentation

## 2022-11-19 DIAGNOSIS — M5432 Sciatica, left side: Secondary | ICD-10-CM

## 2022-11-19 MED ORDER — HYDROMORPHONE HCL 1 MG/ML IJ SOLN
1.0000 mg | Freq: Once | INTRAMUSCULAR | Status: AC
  Administered 2022-11-19: 1 mg via INTRAVENOUS
  Filled 2022-11-19: qty 1

## 2022-11-19 MED ORDER — KETOROLAC TROMETHAMINE 30 MG/ML IJ SOLN
30.0000 mg | Freq: Once | INTRAMUSCULAR | Status: AC
  Administered 2022-11-19: 30 mg via INTRAVENOUS
  Filled 2022-11-19: qty 1

## 2022-11-19 NOTE — ED Provider Notes (Signed)
[facility]  Provider Note  CSN: [id number] Arrival date & time: 11/19/22 7169  History Chief Complaint  Patient presents with   Abdominal Pain    Rachel [name] is a 67 y.o. female with history of chronic pain on Norco 10/325 QID at baseline has had about 2 weeks of L lower back pain, radiating into her L groin/leg. I saw her in the ED on 1/16 and treated her for sciatica with Rx for prednisone. She returned on 1/17 and was evaluated then for renal colic with a CT showing mildly dilated ureter but no ureteral stones. UA was neg for infection. She has made an appointment to follow up with her Orthopedic surgeon next week. She called EMS tonight due to persistent pain. Given fentanyl 178mg and zofran '4mg'$  enroute. No new complaints or falls reported tonight.    Home Medications Prior to Admission medications   Medication Sig Start Date End Date Taking? Authorizing Provider  ALPRAZolam (Duanne Moron 1 MG tablet Take 1 mg by mouth 3 (three) times daily.    [provider]  amLODipine (NORVASC) 10 MG tablet Take 10 mg by mouth every morning.     [provider]  Cholecalciferol (VITAMIN D3) 1.25 MG (50000 UT) CAPS Take 5,000 Units by mouth once a week. 06/24/22   [provider]  citalopram (CELEXA) 20 MG tablet Take 20 mg by mouth every morning.    [provider]  cloNIDine (CATAPRES) 0.1 MG tablet Take 0.1 mg by mouth 2 (two) times daily.    [provider]  cyanocobalamin (VITAMIN B12) 1000 MCG tablet Take 1,000 mcg by mouth daily.    [provider]  gabapentin (NEURONTIN) 300 MG capsule Take 300 mg by mouth 3 (three) times daily. 01/19/19   [provider]  HYDROcodone-acetaminophen (NORCO) 10-325 MG tablet Take 1 tablet by mouth every 6 (six) hours as needed for moderate pain. 06/09/22   [provider]  ketoconazole (NIZORAL) 2 % cream APPLY TO AFFECTED AREA DAILY. Patient  not taking: Reported on 08/15/2022 02/10/19   SLandis Martins DPM  meclizine (ANTIVERT) 25 MG tablet Take 1 tablet (25 mg total) by mouth 3 (three) times daily as needed for dizziness. Patient not taking: Reported on 08/15/2022 02/08/19   PDavonna Belling MD  methocarbamol (ROBAXIN) 500 MG tablet Take 1 tablet (500 mg total) by mouth every 8 (eight) hours as needed for muscle spasms. Patient not taking: Reported on 08/15/2022 09/15/21   REzequiel Essex MD  naproxen (NAPROSYN) 500 MG tablet Take 1 tablet (500 mg total) by mouth 2 (two) times daily with a meal. Patient not taking: Reported on 08/09/2022 09/15/21   Rancour, SAnnie Main MD  ondansetron (ZOFRAN) 4 MG tablet Take 1 tablet (4 mg total) by mouth every 6 (six) hours. 11/13/22   SCristie Hem MD  pantoprazole (PROTONIX) 40 MG tablet Take 1 tablet (40 mg total) by mouth daily. Patient taking differently: Take 40 mg by mouth daily as needed (acid reflux). 07/31/20   Triplett, Tammy, PA-C  pramipexole (MIRAPEX) 0.5 MG tablet Take 1 mg by mouth at bedtime. 08/06/22   [provider]  pravastatin (PRAVACHOL) 40 MG tablet Take 40 mg by mouth at bedtime. 08/21/21   [provider]  predniSONE (STERAPRED UNI-PAK 21 TAB) 10 MG (21) TBPK tablet '10mg'$  Tabs, 6 day taper. Use as directed Patient not taking: Reported on 11/13/2022 11/12/22   STruddie Hidden MD  promethazine (PHENERGAN) 12.5 MG  tablet Take 12.5 mg by mouth every 6 (six) hours as needed for vomiting or nausea. Patient not taking: Reported on 11/13/2022 02/15/22   [provider]  famotidine (PEPCID) 20 MG tablet Take 20 mg by mouth 2 (two) times daily.  01/16/12  [provider]     Allergies    Lisinopril, Penicillins, Povidone-iodine, and Betadine [povidone iodine]   Review of Systems   Review of Systems Please see HPI for pertinent positives and negatives  Physical Exam BP (!) 189/71   Pulse 80   Temp 99.4 F (37.4 C) (Oral)   Resp 18    Ht 5' (1.524 m)   Wt 103 kg   SpO2 90%   BMI 44.35 kg/m   Physical Exam Vitals and nursing note reviewed.  Constitutional:      Appearance: Normal appearance.  HENT:     Head: Normocephalic and atraumatic.     Nose: Nose normal.     Mouth/Throat:     Mouth: Mucous membranes are moist.  Eyes:     Extraocular Movements: Extraocular movements intact.     Conjunctiva/sclera: Conjunctivae normal.  Cardiovascular:     Rate and Rhythm: Normal rate.     Pulses: Normal pulses.  Pulmonary:     Effort: Pulmonary effort is normal.     Breath sounds: Normal breath sounds.  Abdominal:     General: Abdomen is flat.     Palpations: Abdomen is soft.     Tenderness: There is no abdominal tenderness. There is no guarding.  Musculoskeletal:        General: No swelling. Normal range of motion.     Cervical back: Neck supple.     Comments: Pain with ROM of L hip, external signs of infection  Skin:    General: Skin is warm and dry.     Findings: No rash (on exposed skin).  Neurological:     General: No focal deficit present.     Mental Status: She is alert and oriented to person, place, and time.  Psychiatric:        Mood and Affect: Mood normal.     ED Results / Procedures / Treatments   EKG None  Procedures Procedures  Medications Ordered in the ED Medications  ketorolac (TORADOL) 30 MG/ML injection 30 mg (30 mg Intravenous Given 11/19/22 0334)  HYDROmorphone (DILAUDID) injection 1 mg (1 mg Intravenous Given 11/19/22 0335)    Initial Impression and Plan  Patient here for continued L lower back pain with radiation into L groin and L lateral thigh. She has had extensive workup for same in the last week. No change today. I do not suspect an acute, life threatening intra-abdominal process. Her symptoms and exam are most consistent with a lumbar radiculopathy. I explained to the patient that the ED cannot do any further evaluation for her at this time, but we will try to help her with  pain control. She was advised to follow up with her orthopedic doctor as scheduled next week or call her PCP/Pain Management if pain remains uncontrolled on her home meds.   ED Course   Clinical Course as of 11/19/22 0425  Tue Nov 19, 2022  0424 Patient sleeping soundly after pain medications. Advised to follow up as an outpatient as above. RTED for any other concerns.  [CS]    Clinical Course User Index [CS] Truddie Hidden, MD     MDM Rules/Calculators/A&P Medical Decision Making Problems Addressed: Sciatica of left side: chronic illness  or injury with exacerbation, progression, or side effects of treatment  Risk Prescription drug management. Parenteral controlled substances.     Final Clinical Impression(s) / ED Diagnoses Final diagnoses:  Sciatica of left side    Rx / DC Orders ED Discharge Orders     None        Truddie Hidden, MD 11/19/22 0425

## 2022-11-19 NOTE — ED Triage Notes (Signed)
Pt seen here recently and diagnosed with kidney stones. Returns tonight for right sided left flank and abdominal pain with nausea. Pt received 180mg Fentanyl and '4mg'$  Zofran en route.

## 2022-11-25 ENCOUNTER — Ambulatory Visit (INDEPENDENT_AMBULATORY_CARE_PROVIDER_SITE_OTHER): Payer: Medicare Other | Admitting: Orthopedic Surgery

## 2022-11-25 ENCOUNTER — Emergency Department (HOSPITAL_COMMUNITY): Payer: Medicare Other

## 2022-11-25 ENCOUNTER — Other Ambulatory Visit: Payer: Self-pay

## 2022-11-25 ENCOUNTER — Ambulatory Visit (INDEPENDENT_AMBULATORY_CARE_PROVIDER_SITE_OTHER): Payer: Medicare Other

## 2022-11-25 ENCOUNTER — Encounter: Payer: Self-pay | Admitting: Orthopedic Surgery

## 2022-11-25 ENCOUNTER — Emergency Department (HOSPITAL_COMMUNITY)
Admission: EM | Admit: 2022-11-25 | Discharge: 2022-11-26 | Disposition: A | Discharge status: Home / Self Care | Payer: Medicare Other | Attending: Emergency Medicine | Admitting: Emergency Medicine

## 2022-11-25 VITALS — BP 201/90 | HR 102

## 2022-11-25 DIAGNOSIS — Z79899 Other long term (current) drug therapy: Secondary | ICD-10-CM | POA: Diagnosis not present

## 2022-11-25 DIAGNOSIS — M545 Low back pain, unspecified: Secondary | ICD-10-CM

## 2022-11-25 DIAGNOSIS — R000 Tachycardia, unspecified: Secondary | ICD-10-CM | POA: Diagnosis not present

## 2022-11-25 DIAGNOSIS — M5416 Radiculopathy, lumbar region: Secondary | ICD-10-CM | POA: Insufficient documentation

## 2022-11-25 DIAGNOSIS — R109 Unspecified abdominal pain: Secondary | ICD-10-CM | POA: Diagnosis not present

## 2022-11-25 DIAGNOSIS — I10 Essential (primary) hypertension: Secondary | ICD-10-CM | POA: Diagnosis not present

## 2022-11-25 DIAGNOSIS — R531 Weakness: Secondary | ICD-10-CM | POA: Diagnosis present

## 2022-11-25 LAB — CBC WITH DIFFERENTIAL/PLATELET
Abs Immature Granulocytes: 0.05 10*3/uL (ref 0.00–0.07)
Basophils Absolute: 0.1 10*3/uL (ref 0.0–0.1)
Basophils Relative: 1 %
Eosinophils Absolute: 0.2 10*3/uL (ref 0.0–0.5)
Eosinophils Relative: 2 %
HCT: 39.9 % (ref 36.0–46.0)
Hemoglobin: 13.4 g/dL (ref 12.0–15.0)
Immature Granulocytes: 1 %
Lymphocytes Relative: 12 %
Lymphs Abs: 1.4 10*3/uL (ref 0.7–4.0)
MCH: 30 pg (ref 26.0–34.0)
MCHC: 33.6 g/dL (ref 30.0–36.0)
MCV: 89.3 fL (ref 80.0–100.0)
Monocytes Absolute: 0.7 10*3/uL (ref 0.1–1.0)
Monocytes Relative: 6 %
Neutro Abs: 8.6 10*3/uL — ABNORMAL HIGH (ref 1.7–7.7)
Neutrophils Relative %: 78 %
Platelets: 300 10*3/uL (ref 150–400)
RBC: 4.47 MIL/uL (ref 3.87–5.11)
RDW: 13.2 % (ref 11.5–15.5)
WBC: 11 10*3/uL — ABNORMAL HIGH (ref 4.0–10.5)
nRBC: 0 % (ref 0.0–0.2)

## 2022-11-25 LAB — URINALYSIS, ROUTINE W REFLEX MICROSCOPIC
Bacteria, UA: NONE SEEN
Bilirubin Urine: NEGATIVE
Glucose, UA: NEGATIVE mg/dL
Ketones, ur: 5 mg/dL — AB
Leukocytes,Ua: NEGATIVE
Nitrite: NEGATIVE
Protein, ur: 100 mg/dL — AB
Specific Gravity, Urine: 1.006 (ref 1.005–1.030)
pH: 8 (ref 5.0–8.0)

## 2022-11-25 LAB — COMPREHENSIVE METABOLIC PANEL
ALT: 14 U/L (ref 0–44)
AST: 22 U/L (ref 15–41)
Albumin: 3.9 g/dL (ref 3.5–5.0)
Alkaline Phosphatase: 73 U/L (ref 38–126)
Anion gap: 11 (ref 5–15)
BUN: 13 mg/dL (ref 8–23)
CO2: 19 mmol/L — ABNORMAL LOW (ref 22–32)
Calcium: 9 mg/dL (ref 8.9–10.3)
Chloride: 104 mmol/L (ref 98–111)
Creatinine, Ser: 0.8 mg/dL (ref 0.44–1.00)
GFR, Estimated: >60 mL/min (ref >60)
Glucose, Bld: 117 mg/dL — ABNORMAL HIGH (ref 70–99)
Potassium: 4.1 mmol/L (ref 3.5–5.1)
Sodium: 134 mmol/L — ABNORMAL LOW (ref 135–145)
Total Bilirubin: 1 mg/dL (ref 0.3–1.2)
Total Protein: 7.8 g/dL (ref 6.5–8.1)

## 2022-11-25 LAB — LACTIC ACID, PLASMA
Lactic Acid, Venous: 1.7 mmol/L (ref 0.5–1.9)
Lactic Acid, Venous: 0.9 mmol/L (ref 0.5–1.9)

## 2022-11-25 MED ORDER — HYDROMORPHONE HCL 1 MG/ML IJ SOLN
1.0000 mg | Freq: Once | INTRAMUSCULAR | Status: AC
  Administered 2022-11-25: 1 mg via INTRAVENOUS
  Filled 2022-11-25: qty 1

## 2022-11-25 MED ORDER — METHYLPREDNISOLONE ACETATE 40 MG/ML IJ SUSP
40.0000 mg | Freq: Once | INTRAMUSCULAR | Status: AC
  Administered 2022-11-25: 40 mg via INTRAMUSCULAR

## 2022-11-25 MED ORDER — CLONIDINE HCL 0.1 MG PO TABS
0.1000 mg | ORAL_TABLET | ORAL | Status: AC
  Administered 2022-11-25: 0.1 mg via ORAL
  Filled 2022-11-25: qty 1

## 2022-11-25 MED ORDER — LABETALOL HCL 5 MG/ML IV SOLN: 20.0000 mg | INTRAVENOUS | Status: DC

## 2022-11-25 MED ORDER — METHYLPREDNISOLONE ACETATE 40 MG/ML IJ SUSP: 40.0000 mg | Freq: Once | INTRAMUSCULAR | Status: DC

## 2022-11-25 MED ORDER — HYDRALAZINE HCL 25 MG PO TABS
25.0000 mg | ORAL_TABLET | ORAL | Status: AC
  Administered 2022-11-25: 25 mg via ORAL
  Filled 2022-11-25: qty 1

## 2022-11-25 MED ORDER — ONDANSETRON 4 MG PO TBDP: 4.0000 mg | ORAL_TABLET | Freq: Once | ORAL | Status: DC

## 2022-11-25 MED ORDER — ONDANSETRON HCL 4 MG/2ML IJ SOLN
4.0000 mg | Freq: Once | INTRAMUSCULAR | Status: AC
  Administered 2022-11-25: 4 mg via INTRAVENOUS
  Filled 2022-11-25: qty 2

## 2022-11-25 NOTE — ED Provider Notes (Signed)
[facility] Provider Note   CSN: [id number] Arrival date & time: 11/25/22  1817     History {Add pertinent medical, surgical, social history, OB history to HPI:1} Chief Complaint  Patient presents with   Flank Pain    Rachel [name] is a 67 y.o. female.  67 yo F with hx of kidney   - LLE pain, no cauda sxs aside from weakess - given below meds at [place] office today and referred in for MRI - stones non-occlusive No fevers ivdu cancer thinners  Pain diffusely Chronic opioid abuse? Giving nome bp meds  ER  4 times over the last several weeks with back and left lower leg pain radiation from the back towards the knee into the hip as well   Overall she was given Toradol morphine Dilaudid and still did not get good relief.  She is in severe 10 out of 10 pain   She has a history of chronic pain is on chronic opioid therapy Depo-medrol '80mg'$  1. Minimally asymmetrically prominent size of the left ureter without evidence of obstructing stone or lesion may correspond to recently passed stone. Correlate with urinalysis. 2. Nonobstructing left lower pole renal stones. Degen disc dz cloNIDine HCL (CATAPRES) 0.1 MG tablet  Take 1 tablet (0.1 mg total) by mouth. norco        Home Medications Prior to Admission medications   Medication Sig Start Date End Date Taking? Authorizing Provider  ALPRAZolam Duanne Moron) 1 MG tablet Take 1 mg by mouth 3 (three) times daily.    [provider]  amLODipine (NORVASC) 10 MG tablet Take 10 mg by mouth every morning.     [provider]  Cholecalciferol (VITAMIN D3) 1.25 MG (50000 UT) CAPS Take 5,000 Units by mouth once a week. 06/24/22   [provider]  citalopram (CELEXA) 20 MG tablet Take 20 mg by mouth every morning.    [provider]  cloNIDine (CATAPRES) 0.1 MG tablet Take 0.1 mg by mouth 2 (two) times daily.    [provider]  cyanocobalamin  (VITAMIN B12) 1000 MCG tablet Take 1,000 mcg by mouth daily.    [provider]  gabapentin (NEURONTIN) 300 MG capsule Take 300 mg by mouth 3 (three) times daily. 01/19/19   [provider]  HYDROcodone-acetaminophen (NORCO) 10-325 MG tablet Take 1 tablet by mouth every 6 (six) hours as needed for moderate pain. 06/09/22   [provider]  ketoconazole (NIZORAL) 2 % cream APPLY TO AFFECTED AREA DAILY. 02/10/19   Landis Martins, DPM  meclizine (ANTIVERT) 25 MG tablet Take 1 tablet (25 mg total) by mouth 3 (three) times daily as needed for dizziness. 02/08/19   Davonna Belling, MD  methocarbamol (ROBAXIN) 500 MG tablet Take 1 tablet (500 mg total) by mouth every 8 (eight) hours as needed for muscle spasms. 09/15/21   Rancour, Annie Main, MD  naproxen (NAPROSYN) 500 MG tablet Take 1 tablet (500 mg total) by mouth 2 (two) times daily with a meal. 09/15/21   Rancour, Annie Main, MD  ondansetron (ZOFRAN) 4 MG tablet Take 1 tablet (4 mg total) by mouth every 6 (six) hours. 11/13/22   Cristie Hem, MD  pantoprazole (PROTONIX) 40 MG tablet Take 1 tablet (40 mg total) by mouth daily. Patient taking differently: Take 40 mg by mouth daily as needed (acid reflux). 07/31/20   Triplett, Tammy, PA-C  pramipexole (MIRAPEX) 0.5 MG tablet Take 1 mg by mouth at bedtime. 08/06/22  [provider]  pravastatin (PRAVACHOL) 40 MG tablet Take 40 mg by mouth at bedtime. 08/21/21   [provider]  predniSONE (STERAPRED UNI-PAK 21 TAB) 10 MG (21) TBPK tablet '10mg'$  Tabs, 6 day taper. Use as directed 11/12/22   Truddie Hidden, MD  promethazine (PHENERGAN) 12.5 MG tablet Take 12.5 mg by mouth every 6 (six) hours as needed for vomiting or nausea. 02/15/22   [provider]  famotidine (PEPCID) 20 MG tablet Take 20 mg by mouth 2 (two) times daily.  01/16/12  [provider]      Allergies    Lisinopril, Penicillins, Povidone-iodine, and Betadine [povidone iodine]     Review of Systems   Review of Systems  Physical Exam Updated Vital Signs BP (!) 227/104   Pulse (!) 103   Temp 99.2 F (37.3 C) (Rectal)   Resp (!) 25   SpO2 100%  Physical Exam  ED Results / Procedures / Treatments   Labs (all labs ordered are listed, but only abnormal results are displayed) Labs Reviewed  CBC WITH DIFFERENTIAL/PLATELET - Abnormal; Notable for the following components:      Result Value   WBC 11.0 (*)    Neutro Abs 8.6 (*)    All other components within normal limits  COMPREHENSIVE METABOLIC PANEL  LACTIC ACID, PLASMA  LACTIC ACID, PLASMA  URINALYSIS, ROUTINE W REFLEX MICROSCOPIC    EKG None  Radiology DG Lumbar Spine 2-3 Views  Result Date: 11/25/2022 X-ray report Chief complaint back and left leg pain Images 2 v L spine Reading: Multilevel disc space narrowing multilevel osteophytes abnormal sagittal and coronal plane alignment no acute fracture Impression degenerative disc disease    Procedures Procedures  {Document cardiac monitor, telemetry assessment procedure when appropriate:1}  Medications Ordered in ED Medications  cloNIDine (CATAPRES) tablet 0.1 mg (0.1 mg Oral Given 11/25/22 1942)  HYDROmorphone (DILAUDID) injection 1 mg (1 mg Intravenous Given 11/25/22 1941)  ondansetron (ZOFRAN) injection 4 mg (4 mg Intravenous Given 11/25/22 1941)    ED Course/ Medical Decision Making/ A&P   {   Click here for ABCD2, HEART and other calculatorsREFRESH Note before signing :1}                          Medical Decision Making Amount and/or Complexity of Data Reviewed Radiology: ordered.  Risk Prescription drug management.   ***  {Document critical care time when appropriate:1} {Document review of labs and clinical decision tools ie heart score, Chads2Vasc2 etc:1}  {Document your independent review of radiology images, and any outside records:1} {Document your discussion with family members, caretakers, and with consultants:1} {Document  social determinants of health affecting pt's care:1} {Document your decision making why or why not admission, treatments were needed:1} Final Clinical Impression(s) / ED Diagnoses Final diagnoses:  None    Rx / DC Orders ED Discharge Orders     None

## 2022-11-25 NOTE — Patient Instructions (Signed)
Central Scheduling [id number]  While we are working on your approval please go ahead and call to schedule your appointment to be done within one week. If you can not get an appointment at [facility] within the next week, ask if they have something sooner at [facility] or [place] if you are able to go to [facility] to have the imaging done.  AFTER you have made your imaging appointment, please call our office back at [phone] to schedule an appointment to review your results.

## 2022-11-25 NOTE — Addendum Note (Signed)
Addended by: Obie Dredge A on: 11/25/2022 02:05 PM   Modules accepted: Orders

## 2022-11-25 NOTE — ED Triage Notes (Signed)
Pt states multiple visits for kidney stones x 3 weeks, c/o continuing acute flank and back pain.

## 2022-11-25 NOTE — ED Provider Triage Note (Signed)
Emergency Medicine Provider Triage Evaluation Note  Rachel [name] , a 67 y.o. female  was evaluated in triage.  Pt complains of ongoing lower back, flank, and pain into her leg.  Symptoms have been occurring for about 3 weeks.  She has been to the emergency department several times for this.  She saw orthopedics in [place] today and was given a shot of steroids.  When the pain becomes severe, she actively vomits, and is currently vomiting.  No reported fever at home, but borderline in triage on arrival.  Review of Systems  Positive: Back pain, vomiting Negative: Fever  Physical Exam  There were no vitals taken for this visit. Gen:   Awake, appears uncomfortable, clutching emesis bag Resp:  Normal effort  MSK:   Moves extremities without difficulty  Other:  Tachycardia  Medical Decision Making  Medically screening exam initiated at 6:30 PM.  Appropriate orders placed.  Rachel [name] was informed that the remainder of the evaluation will be completed by another provider, this initial triage assessment does not replace that evaluation, and the importance of remaining in the ED until their evaluation is complete.     Carlisle Cater, PA-C 11/25/22 1831

## 2022-11-25 NOTE — Addendum Note (Signed)
Addended by: Obie Dredge A on: 11/25/2022 02:33 PM   Modules accepted: Orders

## 2022-11-25 NOTE — ED Notes (Signed)
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 Slightly SHOB during and after our walk.

## 2022-11-25 NOTE — Progress Notes (Signed)
Chief Complaint  Patient presents with   Back Pain    NEW PROBLEM LBP W LT SIDED SCIATICA  NO KNOWN INJURY    History of present illness this is a 67 year old female with known lumbar degenerative disc disease went to the ER  4 times over the last several weeks with back and left lower leg pain radiation from the back towards the knee into the hip as well  Overall she was given Toradol morphine Dilaudid and still did not get good relief.  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  Constitutional:      Appearance: Normal appearance.  HENT:     Head: Normocephalic and atraumatic.  Eyes:     General: No scleral icterus.       Right eye: No discharge.        Left eye: No discharge.     Extraocular Movements: Extraocular movements intact.     Conjunctiva/sclera: Conjunctivae normal.     Pupils: Pupils are equal, round, and reactive to light.  Cardiovascular:     Rate and Rhythm: Normal rate.     Pulses: Normal pulses.  Musculoskeletal:     Lumbar back: Spasms and tenderness present. No swelling, deformity, lacerations or bony tenderness. Decreased range of motion. Positive left straight leg raise test. No scoliosis.  Skin:    General: Skin is warm and dry.     Capillary Refill: Capillary refill takes less than 2 seconds.  Neurological:     General: No focal deficit present.     Mental Status: She is alert and oriented to person, place, and time.     Gait: Gait abnormal.  Psychiatric:        Mood and Affect: Mood normal.        Behavior: Behavior normal.        Thought Content: Thought content normal.        Judgment: Judgment normal.      X-ray report  Chief complaint left leg and back  pain  Images 2 views of the lumbar spine  Reading: Multilevel degenerative disc disease with joint space narrowing peripheral osteophytes abnormal coronal and sagittal plane alignment  Impression: Advanced degenerative disc disease spondylosis L-spine   Recommend IM Depo-Medrol 80 mg left hip Procedure note Therapeutic injection nurse administration of injection left hip 80 mg of Depo-Medrol with standard technique no complications noted  Recommend MRI  Patient will need to see neurosurgery for further treatment agraffe call results to patient

## 2022-11-25 NOTE — ED Notes (Addendum)
Bladder scanner pt, pt is retaining 295 ml -340 ml of urine in bladder. Pt was scanned after releasing 200 ml of urine.

## 2022-11-26 MED ORDER — PREGABALIN 100 MG PO CAPS: 100.0000 mg | ORAL_CAPSULE | Freq: Two times a day (BID) | ORAL | 0 refills | Status: DC

## 2022-11-26 MED ORDER — OXYCODONE HCL 5 MG PO TABS: 5.0000 mg | ORAL_TABLET | ORAL | 0 refills | Status: DC | PRN

## 2022-11-26 MED ORDER — METHYLPREDNISOLONE 4 MG PO TBPK: ORAL_TABLET | ORAL | 0 refills | Status: DC

## 2022-11-26 NOTE — Discharge Instructions (Addendum)
You were seen for your back pain in the emergency department. You had an MRI that showed a pinched nerve.  At home, please take the steroids that we have prescribed.   Check your MyChart online for the results of any tests that had not resulted by the time you left the emergency department.   Follow-up with your primary doctor in 2-3 days regarding your visit.    Return immediately to the emergency department if you experience any of the following: Bowel or bladder incontinence, numbness between your legs, numbness while wiping after using the restroom, weakness of your legs, or any other concerning symptoms.    Thank you for visiting our Emergency Department. It was a pleasure taking care of you today.

## 2022-11-27 ENCOUNTER — Telehealth: Payer: Self-pay | Admitting: Radiology

## 2022-11-27 NOTE — Telephone Encounter (Signed)
You ordered MRI  She went to ER that night and they did one there You likely didn't get results since it resulted to the ER doctor  Will you review and call patient?  [phone]    IMPRESSION: 1. Worsened L1-L2 disc bulge with superimposed left subarticular extrusion with inferior migration causing severe spinal canal stenosis. 2. Mild spinal canal stenosis and severe left neural foraminal stenosis at L3-L4. 3. Mild spinal canal stenosis and moderate left neural foraminal stenosis at L4-L5. 4. New small central disc extrusion with superior migration at L5-S1 with mild right lateral recess narrowing

## 2022-11-29 ENCOUNTER — Other Ambulatory Visit: Payer: Self-pay

## 2022-11-29 ENCOUNTER — Telehealth: Payer: Self-pay | Admitting: Orthopedic Surgery

## 2022-11-29 DIAGNOSIS — M545 Low back pain, unspecified: Secondary | ICD-10-CM

## 2022-11-29 NOTE — Telephone Encounter (Signed)
Needs referral to neurosurgery    IMPRESSION: 1. Worsened L1-L2 disc bulge with superimposed left subarticular extrusion with inferior migration causing severe spinal canal stenosis. 2. Mild spinal canal stenosis and severe left neural foraminal stenosis at L3-L4. 3. Mild spinal canal stenosis and moderate left neural foraminal stenosis at L4-L5. 4. New small central disc extrusion with superior migration at L5-S1 with mild right lateral recess narrowing.     Electronically Signed   By: Ulyses Jarred M.D.   On: 11/25/2022 22:24

## 2022-12-26 ENCOUNTER — Encounter: Payer: Self-pay | Admitting: Radiology

## 2023-01-09 ENCOUNTER — Encounter: Payer: Self-pay | Admitting: Physical Therapy

## 2023-01-09 ENCOUNTER — Other Ambulatory Visit: Payer: Self-pay

## 2023-01-09 ENCOUNTER — Ambulatory Visit: Payer: Medicare Other | Attending: Family Medicine | Admitting: Physical Therapy

## 2023-01-09 DIAGNOSIS — R2689 Other abnormalities of gait and mobility: Secondary | ICD-10-CM | POA: Diagnosis present

## 2023-01-09 DIAGNOSIS — M5459 Other low back pain: Secondary | ICD-10-CM | POA: Diagnosis not present

## 2023-01-09 DIAGNOSIS — M6281 Muscle weakness (generalized): Secondary | ICD-10-CM | POA: Insufficient documentation

## 2023-01-09 NOTE — Patient Instructions (Signed)
Access Code: [email or web address] URL: [email or web address] Date: 01/09/2023 Prepared by: Hilda Blades  Exercises - Hooklying Gluteal Sets  - 2 x daily - 2 sets - 5 reps - Supine Transversus Abdominis Bracing - Hands on Stomach  - 2 x daily - 2 sets - 5 reps - Supine March with Posterior Pelvic Tilt  - 2 x daily - 2 sets - 5 reps - Clamshell  - 2 x daily - 2 sets - 5 reps - Seated Hamstring Stretch  - 2 x daily - 3 sets - 15 seconds hold
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
## 2023-01-17 ENCOUNTER — Encounter: Payer: Medicare Other | Admitting: Physical Therapy

## 2023-01-21 ENCOUNTER — Encounter: Payer: Self-pay | Admitting: Physical Therapy

## 2023-01-21 ENCOUNTER — Ambulatory Visit: Payer: Medicare Other | Admitting: Physical Therapy

## 2023-01-21 DIAGNOSIS — M5459 Other low back pain: Secondary | ICD-10-CM

## 2023-01-21 DIAGNOSIS — M6281 Muscle weakness (generalized): Secondary | ICD-10-CM

## 2023-01-21 DIAGNOSIS — R2689 Other abnormalities of gait and mobility: Secondary | ICD-10-CM
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
## 2023-01-30 ENCOUNTER — Encounter: Payer: Self-pay | Admitting: Physical Therapy

## 2023-01-30 ENCOUNTER — Other Ambulatory Visit: Payer: Self-pay

## 2023-01-30 ENCOUNTER — Ambulatory Visit: Payer: Medicare Other | Attending: Family Medicine | Admitting: Physical Therapy

## 2023-01-30 DIAGNOSIS — M5459 Other low back pain: Secondary | ICD-10-CM | POA: Diagnosis present

## 2023-01-30 DIAGNOSIS — M6281 Muscle weakness (generalized): Secondary | ICD-10-CM | POA: Insufficient documentation

## 2023-01-30 DIAGNOSIS — R2689 Other abnormalities of gait and mobility: Secondary | ICD-10-CM | POA: Diagnosis present
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
## 2023-01-30 NOTE — Patient Instructions (Signed)
Access Code: [email or web address] URL: [email or web address] Date: 01/30/2023 Prepared by: Hilda Blades  Exercises - Supine Posterior Pelvic Tilt  - 2 x daily - 10 reps - a few seconds hold - Supine Lower Trunk Rotation  - 2 x daily - 10 reps - a few seconds hold - Supine Hip Internal and External Rotation  - 2 x daily - 10 reps - a few seconds hold - Supine Piriformis Stretch with Foot on Ground  - 2 x daily - 3 reps - 30 second hold - Hooklying Clamshell with Resistance  - 1 x daily - 2 sets - 10 reps - 5 hold - Supine Bridge  - 1 x daily - 2 sets - 10 reps - Straight Leg Raise  - 1 x daily - 2 sets - 10 reps

## 2023-02-06 ENCOUNTER — Ambulatory Visit: Payer: Medicare Other | Admitting: Physical Therapy
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
## 2023-02-11 ENCOUNTER — Other Ambulatory Visit: Payer: Self-pay

## 2023-02-11 ENCOUNTER — Ambulatory Visit: Payer: Medicare Other | Admitting: Physical Therapy

## 2023-02-11 ENCOUNTER — Encounter: Payer: Self-pay | Admitting: Physical Therapy

## 2023-02-11 DIAGNOSIS — M5459 Other low back pain: Secondary | ICD-10-CM

## 2023-02-11 DIAGNOSIS — R2689 Other abnormalities of gait and mobility: Secondary | ICD-10-CM

## 2023-02-11 DIAGNOSIS — M6281 Muscle weakness (generalized): Secondary | ICD-10-CM

## 2023-02-11 NOTE — Patient Instructions (Signed)
Access Code: [email or web address] URL: [email or web address] Date: 02/11/2023 Prepared by: Rosana Hoes  Exercises - Hooklying Gluteal Sets  - 2 x daily - 2 sets - 10 reps - Supine Isometric Transversus Abdominis Contraction  - 2 x daily - 2 sets - 10 reps - Supine March with Posterior Pelvic Tilt  - 2 x daily - 2 sets - 10 reps - Sidelying Hip Abduction  - 2 x daily - 2 sets - 10 reps - Seated Hamstring Stretch  - 2 x daily - 3 sets - 15 seconds hold - Supine Lower Trunk Rotation  - 2 x daily - 1 sets - 10 reps - 2-10 hold - Sit to Stand Without Arm Support  - 2 x daily - 2 sets - 15 reps

## 2023-02-19 ENCOUNTER — Ambulatory Visit: Payer: Medicare Other | Admitting: Physical Therapy
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
## 2023-02-26 ENCOUNTER — Other Ambulatory Visit: Payer: Self-pay

## 2023-02-26 ENCOUNTER — Ambulatory Visit: Payer: Medicare Other | Attending: Family Medicine | Admitting: Physical Therapy

## 2023-02-26 ENCOUNTER — Encounter: Payer: Self-pay | Admitting: Physical Therapy

## 2023-02-26 DIAGNOSIS — M6281 Muscle weakness (generalized): Secondary | ICD-10-CM | POA: Insufficient documentation

## 2023-02-26 DIAGNOSIS — R2689 Other abnormalities of gait and mobility: Secondary | ICD-10-CM | POA: Insufficient documentation

## 2023-02-26 DIAGNOSIS — M5459 Other low back pain: Secondary | ICD-10-CM | POA: Insufficient documentation

## 2023-02-26 NOTE — Patient Instructions (Signed)
Access Code: [email or web address] URL: [email or web address] Date: 02/26/2023 Prepared by: Rosana Hoes  Exercises - Supine Hip Internal and External Rotation  - 2 x daily - 10 reps - Hooklying Gluteal Sets  - 2 x daily - 2 sets - 10 reps - Supine March with Posterior Pelvic Tilt  - 2 x daily - 2 sets - 10 reps - Straight Leg Raise  - 2 x daily - 2 sets - 10 reps - Sidelying Hip Abduction  - 2 x daily - 2 sets - 10 reps - Seated Hamstring Stretch  - 2 x daily - 3 sets - 15 seconds hold - Seated Long Arc Quad  - 2 x daily - 2 sets - 10 reps - 5 seconds hold - Sit to Stand Without Arm Support  - 2 x daily - 2 sets - 15 reps - Standing March with Counter Support  - 2 x daily - 2 sets - 10 reps

## 2023-03-04 ENCOUNTER — Encounter: Payer: Self-pay | Admitting: Physical Therapy

## 2023-03-04 ENCOUNTER — Ambulatory Visit: Payer: Medicare Other | Admitting: Physical Therapy

## 2023-03-04 DIAGNOSIS — M6281 Muscle weakness (generalized): Secondary | ICD-10-CM

## 2023-03-04 DIAGNOSIS — R2689 Other abnormalities of gait and mobility: Secondary | ICD-10-CM

## 2023-03-04 DIAGNOSIS — M5459 Other low back pain: Secondary | ICD-10-CM
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
## 2023-03-11 ENCOUNTER — Ambulatory Visit: Payer: Medicare Other | Admitting: Physical Therapy

## 2023-03-18 ENCOUNTER — Encounter: Payer: Self-pay | Admitting: Physical Therapy

## 2023-03-18 ENCOUNTER — Ambulatory Visit: Payer: Medicare Other | Admitting: Physical Therapy

## 2023-03-18 DIAGNOSIS — R2689 Other abnormalities of gait and mobility: Secondary | ICD-10-CM

## 2023-03-18 DIAGNOSIS — M6281 Muscle weakness (generalized): Secondary | ICD-10-CM

## 2023-03-18 DIAGNOSIS — M5459 Other low back pain: Secondary | ICD-10-CM
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
## 2023-03-26 ENCOUNTER — Other Ambulatory Visit: Payer: Self-pay

## 2023-03-26 ENCOUNTER — Encounter: Payer: Self-pay | Admitting: Physical Therapy

## 2023-03-26 ENCOUNTER — Ambulatory Visit: Payer: Medicare Other | Admitting: Physical Therapy

## 2023-03-26 DIAGNOSIS — R2689 Other abnormalities of gait and mobility: Secondary | ICD-10-CM

## 2023-03-26 DIAGNOSIS — M5459 Other low back pain: Secondary | ICD-10-CM | POA: Diagnosis not present

## 2023-03-26 DIAGNOSIS — M6281 Muscle weakness (generalized): Secondary | ICD-10-CM

## 2023-03-26 NOTE — Patient Instructions (Signed)
Access Code: [email or web address] URL: [email or web address] Date: 03/26/2023 Prepared by: Rosana Hoes  Exercises - Supine Hip Internal and External Rotation  - 2 x daily - 10 reps - Hooklying Gluteal Sets  - 2 x daily - 2 sets - 10 reps - Supine March with Posterior Pelvic Tilt  - 2 x daily - 2 sets - 10 reps - Straight Leg Raise  - 2 x daily - 2 sets - 10 reps - Sidelying Hip Abduction  - 2 x daily - 2 sets - 10 reps - Seated Hamstring Stretch  - 2 x daily - 3 sets - 15 seconds hold - Seated Long Arc Quad  - 2 x daily - 2 sets - 10 reps - 5 seconds hold - Sit to Stand Without Arm Support  - 2 x daily - 2 sets - 15 reps - Standing March with Counter Support  - 2 x daily - 2 sets - 10 reps - Seated Flexion Stretch with Swiss Ball  - 1 x daily - 7 x weekly - 1 sets - 5 reps - 10 hold - Standing Hip Extension with Counter Support  - 1 x daily - 2 sets - 15 reps

## 2023-04-01 ENCOUNTER — Ambulatory Visit: Payer: Medicare Other | Admitting: Physical Therapy

## 2023-04-07 ENCOUNTER — Ambulatory Visit: Payer: Medicare Other | Admitting: Physical Therapy

## 2023-04-15 ENCOUNTER — Ambulatory Visit: Payer: Medicare Other | Admitting: Physical Therapy

## 2023-04-22 ENCOUNTER — Ambulatory Visit: Payer: Medicare Other | Admitting: Physical Therapy

## 2023-05-05 ENCOUNTER — Encounter: Payer: Self-pay | Admitting: Podiatry

## 2023-05-05 ENCOUNTER — Ambulatory Visit (INDEPENDENT_AMBULATORY_CARE_PROVIDER_SITE_OTHER): Payer: Medicare Other | Admitting: Podiatry

## 2023-05-05 DIAGNOSIS — B351 Tinea unguium: Secondary | ICD-10-CM | POA: Diagnosis not present

## 2023-05-05 DIAGNOSIS — L309 Dermatitis, unspecified: Secondary | ICD-10-CM | POA: Diagnosis not present

## 2023-05-05 MED ORDER — TERBINAFINE HCL 250 MG PO TABS: 250.0000 mg | ORAL_TABLET | Freq: Every day | ORAL | 0 refills | Status: DC

## 2023-05-05 MED ORDER — CLOTRIMAZOLE-BETAMETHASONE 1-0.05 % EX CREA: 1.0000 | TOPICAL_CREAM | Freq: Every day | CUTANEOUS | 0 refills | Status: DC

## 2023-05-05 NOTE — Progress Notes (Signed)
Subjective:   Patient ID: Rachel [name], female   DOB: 67 y.o.   MRN: [id number]   HPI Patient presents concerned about breakdown of tissue between the toes and on the right foot with redness and significant itch component.  Patient does not smoke likes to be active   Review of Systems  All other systems reviewed and are negative.       Objective:  Physical Exam Vitals and nursing note reviewed.  Constitutional:      Appearance: She is well-developed.  Pulmonary:     Effort: Pulmonary effort is normal.  Musculoskeletal:        General: Normal range of motion.  Skin:    General: Skin is warm.  Neurological:     Mental Status: She is alert.     Neurovascular status was found to be intact muscle strength was found to be within normal limits patient is on a walker has discoloration and irritation between the lesser digits right redness along the lateral side of the foot with good digital perfusion noted well-oriented     Assessment:  Probability for a fungal infection that has a systemic element to it right     Plan:  H&P reviewed went ahead today and placed patient on oral antifungal for 30 days Lamisil and Lotrisone cream and if symptoms persist will be seen back or see dermatology

## 2023-07-10 ENCOUNTER — Encounter: Payer: Self-pay | Admitting: Family Medicine

## 2023-07-10 ENCOUNTER — Ambulatory Visit (INDEPENDENT_AMBULATORY_CARE_PROVIDER_SITE_OTHER): Payer: Medicare Other | Admitting: Family Medicine

## 2023-07-10 VITALS — BP 152/48 | HR 98 | Temp 98.3°F | Ht 61.0 in | Wt 218.0 lb

## 2023-07-10 DIAGNOSIS — E785 Hyperlipidemia, unspecified: Secondary | ICD-10-CM | POA: Diagnosis not present

## 2023-07-10 DIAGNOSIS — R0789 Other chest pain: Secondary | ICD-10-CM

## 2023-07-10 DIAGNOSIS — G8929 Other chronic pain: Secondary | ICD-10-CM

## 2023-07-10 DIAGNOSIS — Z13820 Encounter for screening for osteoporosis: Secondary | ICD-10-CM

## 2023-07-10 DIAGNOSIS — Z131 Encounter for screening for diabetes mellitus: Secondary | ICD-10-CM

## 2023-07-10 DIAGNOSIS — Z23 Encounter for immunization: Secondary | ICD-10-CM | POA: Diagnosis not present

## 2023-07-10 DIAGNOSIS — I10 Essential (primary) hypertension: Secondary | ICD-10-CM

## 2023-07-10 DIAGNOSIS — Z79899 Other long term (current) drug therapy: Secondary | ICD-10-CM

## 2023-07-10 DIAGNOSIS — Z1159 Encounter for screening for other viral diseases: Secondary | ICD-10-CM

## 2023-07-10 DIAGNOSIS — Z780 Asymptomatic menopausal state: Secondary | ICD-10-CM

## 2023-07-10 DIAGNOSIS — Z1231 Encounter for screening mammogram for malignant neoplasm of breast: Secondary | ICD-10-CM

## 2023-07-10 NOTE — Assessment & Plan Note (Signed)
Right sided chest pain sounds musculoskeletal. Worse when she lifts her arm and it does not come and go. EKG NSR.

## 2023-07-10 NOTE — Addendum Note (Signed)
Addended by: Arta Silence on: 07/10/2023 04:40 PM   Modules accepted: Orders

## 2023-07-10 NOTE — Assessment & Plan Note (Signed)
Currently taking Hydrocodone-Acetaminophen 10-325 1 tablet q6h PRN. She is seeing ortho and neurosurgery. Has completed PT. Will place referral to chronic pain. PDMP reviewed. Controlled substance contract signed and UDS completed.
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
## 2023-07-10 NOTE — Assessment & Plan Note (Signed)
Chronic uncontrolled. Today's BP 152/48 and she is taking Amlodipine 10mg  daily and Clonidine 0.1mg  BID. She reports anxiety about today's OV and BP has historically been well controlled. Recommend heart healthy diet such as Mediterranean diet with whole grains, fruits, vegetable, fish, lean meats, nuts, and olive oil. Limit salt. Encouraged moderate walking, 3-5 times/week for 30-50 minutes each session. Aim for at least 150 minutes.week. Goal should be pace of 3 miles/hours, or walking 1.5 miles in 30 minutes. Avoid tobacco products. Avoid excess alcohol. Take medications as prescribed and bring medications and blood pressure log with cuff to each office visit. Seek medical care for chest pain, palpitations, shortness of breath with exertion, dizziness/lightheadedness, vision changes, recurrent headaches, or swelling of extremities. Return to my office in 1 week to discuss labs and recheck BP.

## 2023-07-10 NOTE — Assessment & Plan Note (Signed)
Non fasting labs today as she has difficulty with transportation.

## 2023-07-11 ENCOUNTER — Other Ambulatory Visit: Payer: Self-pay

## 2023-07-11 LAB — COMPLETE METABOLIC PANEL WITH GFR
AG Ratio: 1.1 (calc) (ref 1.0–2.5)
ALT: 9 U/L (ref 6–29)
AST: 16 U/L (ref 10–35)
Albumin: 3.6 g/dL (ref 3.6–5.1)
Alkaline phosphatase (APISO): 76 U/L (ref 37–153)
BUN: 12 mg/dL (ref 7–25)
CO2: 25 mmol/L (ref 20–32)
Calcium: 9.2 mg/dL (ref 8.6–10.4)
Chloride: 105 mmol/L (ref 98–110)
Creat: 0.95 mg/dL (ref 0.50–1.05)
Globulin: 3.4 g/dL (ref 1.9–3.7)
Glucose, Bld: 100 mg/dL — ABNORMAL HIGH (ref 65–99)
Potassium: 3.9 mmol/L (ref 3.5–5.3)
Sodium: 139 mmol/L (ref 135–146)
Total Bilirubin: 0.4 mg/dL (ref 0.2–1.2)
Total Protein: 7 g/dL (ref 6.1–8.1)
eGFR: 65 mL/min/{1.73_m2} (ref >=60)

## 2023-07-11 LAB — CBC WITH DIFFERENTIAL/PLATELET
Absolute Monocytes: 824 {cells}/uL (ref 200–950)
Basophils Absolute: 50 {cells}/uL (ref 0–200)
Basophils Relative: 0.7 %
Eosinophils Absolute: 469 {cells}/uL (ref 15–500)
Eosinophils Relative: 6.6 %
HCT: 37.7 % (ref 35.0–45.0)
Hemoglobin: 12.6 g/dL (ref 11.7–15.5)
Lymphs Abs: 2016 {cells}/uL (ref 850–3900)
MCH: 30.7 pg (ref 27.0–33.0)
MCHC: 33.4 g/dL (ref 32.0–36.0)
MCV: 91.7 fL (ref 80.0–100.0)
MPV: 11.3 fL (ref 7.5–12.5)
Monocytes Relative: 11.6 %
Neutro Abs: 3742 {cells}/uL (ref 1500–7800)
Neutrophils Relative %: 52.7 %
Platelets: 284 10*3/uL (ref 140–400)
RBC: 4.11 10*6/uL (ref 3.80–5.10)
RDW: 12.4 % (ref 11.0–15.0)
Total Lymphocyte: 28.4 %
WBC: 7.1 10*3/uL (ref 3.8–10.8)

## 2023-07-11 LAB — DRUG MONITOR, PANEL 1, W/CONF, URINE
Amphetamines: NEGATIVE ng/mL (ref <500)
Barbiturates: NEGATIVE ng/mL (ref <300)
Benzodiazepines: NEGATIVE ng/mL (ref <100)
Cocaine Metabolite: NEGATIVE ng/mL (ref <150)
Creatinine: >300 mg/dL (ref >=20.0)
Marijuana Metabolite: NEGATIVE ng/mL (ref <20)
Methadone Metabolite: NEGATIVE ng/mL (ref <100)
Opiates: NEGATIVE ng/mL (ref <100)
Oxidant: NEGATIVE ug/mL (ref <200)
Oxycodone: NEGATIVE ng/mL (ref <100)
Phencyclidine: NEGATIVE ng/mL (ref <25)
pH: 5.5 (ref 4.5–9.0)

## 2023-07-11 LAB — HEMOGLOBIN A1C
Hgb A1c MFr Bld: 6.2 %{Hb} — ABNORMAL HIGH (ref <5.7)
Mean Plasma Glucose: 131 mg/dL
eAG (mmol/L): 7.3 mmol/L

## 2023-07-11 LAB — HEPATITIS C ANTIBODY: Hepatitis C Ab: NONREACTIVE

## 2023-07-11 LAB — LDL CHOLESTEROL, DIRECT: Direct LDL: 92 mg/dL (ref <100)

## 2023-07-11 LAB — DM TEMPLATE

## 2023-07-14 MED ORDER — HYDROCODONE-ACETAMINOPHEN 10-325 MG PO TABS: 1.0000 | ORAL_TABLET | Freq: Four times a day (QID) | ORAL | 0 refills | Status: AC | PRN

## 2023-07-14 NOTE — Telephone Encounter (Signed)
Spoke w/pt and aware meds have been sent in.

## 2023-07-16 ENCOUNTER — Encounter: Payer: Self-pay | Admitting: Pharmacist

## 2023-07-18 ENCOUNTER — Telehealth: Payer: Self-pay | Admitting: Pharmacist

## 2023-07-21 ENCOUNTER — Other Ambulatory Visit: Payer: Self-pay | Admitting: Family Medicine

## 2023-07-22 NOTE — Telephone Encounter (Signed)
Patient called to follow up on refill requested for cloNIDine (CATAPRES) 0.1 MG tablet   Patient stated she's been out of pills for almost one week.  Pharmacy confirmed as:  [facility] - [place], [place] - [address] [place], [place] [address] Phone: [phone]  Fax: [phone]   Please advise patientt at [phone]

## 2023-07-23 ENCOUNTER — Other Ambulatory Visit: Payer: Self-pay

## 2023-07-23 DIAGNOSIS — I10 Essential (primary) hypertension: Secondary | ICD-10-CM

## 2023-07-23 MED ORDER — CLONIDINE HCL 0.1 MG PO TABS: 0.1000 mg | ORAL_TABLET | Freq: Two times a day (BID) | ORAL | 1 refills | Status: AC

## 2023-07-24 ENCOUNTER — Ambulatory Visit: Payer: Medicare Other | Admitting: Family Medicine

## 2023-07-24 ENCOUNTER — Encounter: Payer: Self-pay | Admitting: Family Medicine

## 2023-07-24 VITALS — BP 140/80 | HR 99 | Temp 98.9°F | Ht 61.0 in | Wt 239.0 lb

## 2023-07-24 DIAGNOSIS — I10 Essential (primary) hypertension: Secondary | ICD-10-CM

## 2023-07-24 DIAGNOSIS — R7303 Prediabetes: Secondary | ICD-10-CM

## 2023-07-24 DIAGNOSIS — G8929 Other chronic pain: Secondary | ICD-10-CM | POA: Diagnosis not present

## 2023-07-24 NOTE — Assessment & Plan Note (Addendum)
A1c 6.2%. Recommend low carb heart healthy diet, exercise as tolerated, and weight management.

## 2023-07-24 NOTE — Assessment & Plan Note (Signed)
BP improved today 140/80 despite her anxiety surrounding this appointment. Will continue Clonidine 0.1mg  daily and Amlodipine 10mg  daily. Encouraged to monitor BP at home and report to office if values sustain > 140/90. Take medications as prescribed and bring medications and blood pressure log with cuff to each office visit. Seek medical care for chest pain, palpitations, shortness of breath with exertion, dizziness/lightheadedness, vision changes, recurrent headaches, or swelling of extremities. Follow up in 1 month for recheck.

## 2023-07-24 NOTE — Assessment & Plan Note (Signed)
Currently taking Hydrocodone-Acetaminophen 10-325 1 tablet q6h PRN. She is seeing ortho and neurosurgery. Has completed PT. Declined pain management. PDMP reviewed. Controlled substance contract signed. UDS was NEGATIVE for opioids and benzodiazepines and inconsistent with prescribed and reported medications. I informed her I would not be able to continue refilling these medications as it appears she is not taking them. She agreed to proceed with alternative provider and consider pain management in [place] or [place], I have contacted our referral coordinator about this.
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
## 2023-08-07 ENCOUNTER — Ambulatory Visit: Payer: Medicare Other | Admitting: Family Medicine

## 2023-08-13 ENCOUNTER — Telehealth: Payer: Self-pay | Admitting: Internal Medicine

## 2023-08-13 NOTE — Telephone Encounter (Signed)
Need new patient approval

## 2023-08-13 NOTE — Telephone Encounter (Signed)
Patient informed. 

## 2023-08-20 ENCOUNTER — Other Ambulatory Visit (HOSPITAL_COMMUNITY): Payer: Medicare Other

## 2023-08-20 ENCOUNTER — Inpatient Hospital Stay (HOSPITAL_COMMUNITY): Admission: RE | Admit: 2023-08-20 | Payer: Medicare Other | Source: Ambulatory Visit

## 2023-08-22 ENCOUNTER — Other Ambulatory Visit: Payer: Self-pay | Admitting: Family Medicine

## 2023-08-22 DIAGNOSIS — I70223 Atherosclerosis of native arteries of extremities with rest pain, bilateral legs: Secondary | ICD-10-CM

## 2023-08-29 ENCOUNTER — Ambulatory Visit
Admission: RE | Admit: 2023-08-29 | Discharge: 2023-08-29 | Disposition: A | Discharge status: Home / Self Care | Payer: Medicare Other | Source: Ambulatory Visit | Attending: Family Medicine | Admitting: Family Medicine

## 2023-08-29 DIAGNOSIS — I70223 Atherosclerosis of native arteries of extremities with rest pain, bilateral legs: Secondary | ICD-10-CM

## 2023-09-10 ENCOUNTER — Inpatient Hospital Stay (HOSPITAL_COMMUNITY): Admission: RE | Admit: 2023-09-10 | Payer: Self-pay | Source: Ambulatory Visit

## 2023-09-10 ENCOUNTER — Other Ambulatory Visit (HOSPITAL_COMMUNITY): Payer: Self-pay

## 2023-09-10 NOTE — Telephone Encounter (Signed)
    07/18/2023 Name: Rachel [name] MRN: [id number] DOB: May 11, 1956   Unsuccessful outreach to patient re: blood pressure.  I will have patient rescheduled with PharmD to assist with BP as needed.  Kieth Brightly, PharmD, BCACP, CPP Clinical Pharmacist, [facility]

## 2023-09-15 ENCOUNTER — Ambulatory Visit: Payer: Medicare Other | Admitting: Family

## 2023-09-17 ENCOUNTER — Ambulatory Visit: Payer: Medicare Other | Admitting: Adult Health

## 2023-10-30 ENCOUNTER — Ambulatory Visit: Payer: Medicare Other | Admitting: Adult Health

## 2023-11-20 ENCOUNTER — Telehealth (INDEPENDENT_AMBULATORY_CARE_PROVIDER_SITE_OTHER): Payer: Self-pay | Admitting: Otolaryngology

## 2023-11-20 NOTE — Telephone Encounter (Signed)
Called [address] [facility] on 11/18/23 to request reason for referral and OV Notes.  Spoke w/ Damian Leavell.  She sent message to Care Team.  11/20/23 have still not received referral reason or Notes.  Called [address] and spoke w/ Janine Limbo.  She stated that their records indicate information was faxed on 11/18/23 but we did not receive it.  I verified the fax # and requested they resend the information.

## 2023-11-21 ENCOUNTER — Other Ambulatory Visit (HOSPITAL_COMMUNITY): Payer: Self-pay | Admitting: Family Medicine

## 2023-11-21 ENCOUNTER — Other Ambulatory Visit: Payer: Self-pay | Admitting: Orthopedic Surgery

## 2023-11-21 DIAGNOSIS — M1990 Unspecified osteoarthritis, unspecified site: Secondary | ICD-10-CM

## 2023-11-21 DIAGNOSIS — M545 Low back pain, unspecified: Secondary | ICD-10-CM

## 2023-12-04 ENCOUNTER — Encounter (INDEPENDENT_AMBULATORY_CARE_PROVIDER_SITE_OTHER): Payer: Self-pay | Admitting: *Deleted

## 2023-12-05 ENCOUNTER — Other Ambulatory Visit (HOSPITAL_COMMUNITY): Payer: Self-pay | Admitting: Family Medicine

## 2023-12-05 DIAGNOSIS — Z13820 Encounter for screening for osteoporosis: Secondary | ICD-10-CM

## 2023-12-05 DIAGNOSIS — E785 Hyperlipidemia, unspecified: Secondary | ICD-10-CM

## 2023-12-05 DIAGNOSIS — Z23 Encounter for immunization: Secondary | ICD-10-CM

## 2023-12-05 DIAGNOSIS — Z1231 Encounter for screening mammogram for malignant neoplasm of breast: Secondary | ICD-10-CM

## 2023-12-05 DIAGNOSIS — Z79899 Other long term (current) drug therapy: Secondary | ICD-10-CM

## 2023-12-05 DIAGNOSIS — R0789 Other chest pain: Secondary | ICD-10-CM

## 2023-12-05 DIAGNOSIS — Z1159 Encounter for screening for other viral diseases: Secondary | ICD-10-CM

## 2023-12-05 DIAGNOSIS — Z131 Encounter for screening for diabetes mellitus: Secondary | ICD-10-CM

## 2023-12-05 DIAGNOSIS — I10 Essential (primary) hypertension: Secondary | ICD-10-CM

## 2023-12-05 DIAGNOSIS — G8929 Other chronic pain: Secondary | ICD-10-CM

## 2023-12-11 ENCOUNTER — Ambulatory Visit (HOSPITAL_COMMUNITY): Payer: Medicare HMO

## 2023-12-24 ENCOUNTER — Ambulatory Visit (HOSPITAL_COMMUNITY)
Admission: RE | Admit: 2023-12-24 | Discharge: 2023-12-24 | Disposition: A | Discharge status: Home / Self Care | Payer: Medicare HMO | Source: Ambulatory Visit | Attending: Family Medicine | Admitting: Family Medicine

## 2023-12-24 DIAGNOSIS — Z13820 Encounter for screening for osteoporosis: Secondary | ICD-10-CM | POA: Insufficient documentation

## 2023-12-24 DIAGNOSIS — Z780 Asymptomatic menopausal state: Secondary | ICD-10-CM | POA: Diagnosis not present

## 2023-12-24 DIAGNOSIS — Z1231 Encounter for screening mammogram for malignant neoplasm of breast: Secondary | ICD-10-CM | POA: Insufficient documentation

## 2023-12-24 DIAGNOSIS — M1990 Unspecified osteoarthritis, unspecified site: Secondary | ICD-10-CM | POA: Diagnosis present

## 2023-12-29 ENCOUNTER — Telehealth: Payer: Self-pay | Admitting: *Deleted

## 2023-12-29 NOTE — Telephone Encounter (Signed)
  Procedure: COLONOSCOPY  Height: 5'1 Weight: 229lbs       Have you had a colonoscopy before?  12/14/13, Dr. Elnoria Howard (in epic)  Do you have family history of colon cancer?  no  Do you have a family history of polyps? no  Previous colonoscopy with polyps removed? no  Do you have a history colorectal cancer?   no  Are you diabetic?  no  Do you have a prosthetic or mechanical heart valve? no  Do you have a pacemaker/defibrillator?   no  Have you had endocarditis/atrial fibrillation?  no  Do you use supplemental oxygen/CPAP?  no  Have you had joint replacement within the last 12 months?  no  Do you tend to be constipated or have to use laxatives?  no   Do you have history of alcohol use? If yes, how much and how often.  no  Do you have history or are you using drugs? If yes, what do are you  using?  no  Have you ever had a stroke/heart attack?  no  Have you ever had a heart or other vascular stent placed,?no  Do you take weight loss medication? no  female patients,: have you had a hysterectomy? no                              are you post menopausal?  no                              do you still have your menstrual cycle? no    Date of last menstrual period? 1999  Do you take any blood-thinning medications such as: (Plavix, aspirin, Coumadin, Aggrenox, Brilinta, Xarelto, Eliquis, Pradaxa, Savaysa or Effient)? no  If yes we need the name, milligram, dosage and who is prescribing doctor:               Current Outpatient Medications  Medication Sig Dispense Refill   ALPRAZolam (XANAX) 1 MG tablet Take 1 mg by mouth 3 (three) times daily.     amLODipine (NORVASC) 10 MG tablet Take 10 mg by mouth every morning.     citalopram (CELEXA) 20 MG tablet Take by mouth.     cloNIDine (CATAPRES) 0.1 MG tablet Take 1 tablet (0.1 mg total) by mouth 2 (two) times daily. 180 tablet 1   gabapentin (NEURONTIN) 300 MG capsule Take 300 mg by mouth daily.     HYDROcodone-acetaminophen (NORCO)  10-325 MG tablet Take 1 tablet by mouth every 6 (six) hours as needed for moderate pain. 120 tablet 0   pramipexole (MIRAPEX) 0.5 MG tablet Take 1 mg by mouth at bedtime.     No current facility-administered medications for this visit.    Allergies  Allergen Reactions   Lisinopril Cough   Penicillins Itching    .Has patient had a PCN reaction causing immediate rash, facial/tongue/throat swelling, SOB or lightheadedness with hypotension: Yes Has patient had a PCN reaction causing severe rash involving mucus membranes or skin necrosis:No Has patient had a PCN reaction that required hospitalization: No Has patient had a PCN reaction occurring within the last 10 years: No If all of the above answers are "NO", then may proceed with Cephalosporin use.   Povidone-Iodine Itching   Betadine [Povidone Iodine] Rash

## 2024-01-07 ENCOUNTER — Telehealth (INDEPENDENT_AMBULATORY_CARE_PROVIDER_SITE_OTHER): Payer: Self-pay | Admitting: Otolaryngology

## 2024-01-07 NOTE — Telephone Encounter (Signed)
 Confirmed appt & location [id number] [facility]

## 2024-01-08 ENCOUNTER — Ambulatory Visit (INDEPENDENT_AMBULATORY_CARE_PROVIDER_SITE_OTHER): Payer: Medicare Other | Admitting: Otolaryngology

## 2024-01-08 VITALS — BP 158/70 | HR 64 | Ht 61.0 in | Wt 230.0 lb

## 2024-01-08 DIAGNOSIS — J330 Polyp of nasal cavity: Secondary | ICD-10-CM

## 2024-01-08 DIAGNOSIS — J3489 Other specified disorders of nose and nasal sinuses: Secondary | ICD-10-CM | POA: Diagnosis not present

## 2024-01-08 DIAGNOSIS — J320 Chronic maxillary sinusitis: Secondary | ICD-10-CM

## 2024-01-08 DIAGNOSIS — J329 Chronic sinusitis, unspecified: Secondary | ICD-10-CM | POA: Diagnosis not present

## 2024-01-08 DIAGNOSIS — J322 Chronic ethmoidal sinusitis: Secondary | ICD-10-CM

## 2024-01-08 DIAGNOSIS — R040 Epistaxis: Secondary | ICD-10-CM | POA: Diagnosis not present

## 2024-01-09 DIAGNOSIS — J320 Chronic maxillary sinusitis: Secondary | ICD-10-CM | POA: Insufficient documentation

## 2024-01-09 DIAGNOSIS — J330 Polyp of nasal cavity: Secondary | ICD-10-CM | POA: Insufficient documentation

## 2024-01-09 DIAGNOSIS — J322 Chronic ethmoidal sinusitis: Secondary | ICD-10-CM | POA: Insufficient documentation

## 2024-01-09 DIAGNOSIS — R040 Epistaxis: Secondary | ICD-10-CM | POA: Insufficient documentation
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
## 2024-01-19 ENCOUNTER — Encounter: Payer: Self-pay | Admitting: *Deleted

## 2024-01-19 ENCOUNTER — Other Ambulatory Visit: Payer: Self-pay | Admitting: *Deleted

## 2024-01-19 MED ORDER — PEG 3350-KCL-NA BICARB-NACL 420 G PO SOLR: 4000.0000 mL | Freq: Once | ORAL | 0 refills | Status: AC

## 2024-01-19 NOTE — Telephone Encounter (Signed)
 Pt has been scheduled for 02/09/24 with Dr.Carver. instructions sent via mychart and prep sent to the pharmacy.

## 2024-01-19 NOTE — Telephone Encounter (Signed)
 Okay to schedule. ASA 3 (BMI)

## 2024-01-22 ENCOUNTER — Encounter (INDEPENDENT_AMBULATORY_CARE_PROVIDER_SITE_OTHER): Payer: Medicare Other | Admitting: Physician Assistant

## 2024-01-27 ENCOUNTER — Encounter: Payer: Self-pay | Admitting: *Deleted

## 2024-01-27 NOTE — Telephone Encounter (Signed)
 Referral completed, [facility] apt letter sent to PCP

## 2024-01-29 ENCOUNTER — Encounter (INDEPENDENT_AMBULATORY_CARE_PROVIDER_SITE_OTHER): Payer: Self-pay

## 2024-02-04 ENCOUNTER — Encounter (HOSPITAL_COMMUNITY)
Admission: RE | Admit: 2024-02-04 | Discharge: 2024-02-04 | Disposition: A | Discharge status: Home / Self Care | Source: Ambulatory Visit | Attending: Internal Medicine | Admitting: Internal Medicine

## 2024-02-04 ENCOUNTER — Encounter (HOSPITAL_COMMUNITY): Payer: Self-pay

## 2024-02-09 ENCOUNTER — Encounter (HOSPITAL_COMMUNITY): Payer: Self-pay | Admitting: Internal Medicine

## 2024-02-09 ENCOUNTER — Ambulatory Visit (HOSPITAL_COMMUNITY)

## 2024-02-09 ENCOUNTER — Ambulatory Visit (HOSPITAL_COMMUNITY)
Admission: RE | Admit: 2024-02-09 | Discharge: 2024-02-09 | Disposition: A | Discharge status: Home / Self Care | Attending: Internal Medicine | Admitting: Internal Medicine

## 2024-02-09 ENCOUNTER — Encounter (HOSPITAL_COMMUNITY)
Admission: RE | Disposition: A | Discharge status: Home / Self Care | Payer: Self-pay | Source: Home / Self Care | Attending: Internal Medicine

## 2024-02-09 DIAGNOSIS — E6689 Other obesity not elsewhere classified: Secondary | ICD-10-CM | POA: Insufficient documentation

## 2024-02-09 DIAGNOSIS — I10 Essential (primary) hypertension: Secondary | ICD-10-CM | POA: Insufficient documentation

## 2024-02-09 DIAGNOSIS — D123 Benign neoplasm of transverse colon: Secondary | ICD-10-CM | POA: Insufficient documentation

## 2024-02-09 DIAGNOSIS — D122 Benign neoplasm of ascending colon: Secondary | ICD-10-CM

## 2024-02-09 DIAGNOSIS — T402X5A Adverse effect of other opioids, initial encounter: Secondary | ICD-10-CM | POA: Diagnosis not present

## 2024-02-09 DIAGNOSIS — Z1211 Encounter for screening for malignant neoplasm of colon: Secondary | ICD-10-CM | POA: Diagnosis present

## 2024-02-09 DIAGNOSIS — K648 Other hemorrhoids: Secondary | ICD-10-CM | POA: Diagnosis not present

## 2024-02-09 DIAGNOSIS — K5903 Drug induced constipation: Secondary | ICD-10-CM | POA: Insufficient documentation

## 2024-02-09 HISTORY — PX: COLONOSCOPY: SHX5424

## 2024-02-09 SURGERY — COLONOSCOPY
Anesthesia: General

## 2024-02-09 MED ORDER — PROPOFOL 500 MG/50ML IV EMUL
INTRAVENOUS | Status: AC
  Filled 2024-02-09: qty 50

## 2024-02-09 MED ORDER — PROPOFOL 500 MG/50ML IV EMUL
INTRAVENOUS | Status: DC | PRN
  Administered 2024-02-09: 100 ug/kg/min via INTRAVENOUS

## 2024-02-09 MED ORDER — LIDOCAINE HCL (PF) 2 % IJ SOLN
INTRAMUSCULAR | Status: AC
  Filled 2024-02-09: qty 5

## 2024-02-09 MED ORDER — PROPOFOL 10 MG/ML IV BOLUS
INTRAVENOUS | Status: DC | PRN
  Administered 2024-02-09: 40 mg via INTRAVENOUS
  Administered 2024-02-09: 20 mg via INTRAVENOUS
  Administered 2024-02-09: 50 mg via INTRAVENOUS

## 2024-02-09 MED ORDER — LACTATED RINGERS IV SOLN: INTRAVENOUS | Status: DC | PRN

## 2024-02-09 NOTE — Anesthesia Postprocedure Evaluation (Signed)
 Anesthesia Post Note  Patient: Rachel [name]  Procedure(s) Performed: COLONOSCOPY  Patient location during evaluation: PACU Anesthesia Type: General Level of consciousness: awake and alert Pain management: pain level controlled Vital Signs Assessment: post-procedure vital signs reviewed and stable Respiratory status: spontaneous breathing, nonlabored ventilation, respiratory function stable and patient connected to nasal cannula oxygen Cardiovascular status: stable and blood pressure returned to baseline Postop Assessment: no apparent nausea or vomiting Anesthetic complications: no   No notable events documented.   Last Vitals:  Vitals:   02/09/24 1212 02/09/24 1343  BP: (!) 161/53 (!) 121/47  Pulse: 66 70  Resp: 14 16  Temp: 36.8 C 36.5 C  SpO2: 100% 100%    Last Pain:  Vitals:   02/09/24 1343  TempSrc: Oral  PainSc: 0-No pain                 Beacher [place]

## 2024-02-09 NOTE — Op Note (Signed)
 [facility] Patient Name: Rachel [name] Procedure Date: 02/09/2024 1:01 PM MRN: [id number] Date of Birth: 12-25-1955 Attending MD: Rolando Cliche. Mordechai April , [place], [id number] CSN: [id number] Age: 68 Admit Type: Outpatient Procedure:                Colonoscopy Indications:              Screening for colorectal malignant neoplasm Providers:                Rolando Cliche. Mordechai April, DO, Crystal Page, Sharlette Dayhoff                            Technician, Technician Referring MD:              Medicines:                See the Anesthesia note for documentation of the                            administered medications Complications:            No immediate complications. Estimated Blood Loss:     Estimated blood loss was minimal. Procedure:                Pre-Anesthesia Assessment:                           - The anesthesia plan was to use monitored                            anesthesia care (MAC).                           After obtaining informed consent, the colonoscope                            was passed under direct vision. Throughout the                            procedure, the patient's blood pressure, pulse, and                            oxygen saturations were monitored continuously. The                            PCF-HQ190L ([id number]) scope was introduced through                            the anus and advanced to the the cecum, identified                            by appendiceal orifice and ileocecal valve. The                            colonoscopy was performed without difficulty. The                            patient tolerated the procedure well.  The quality                            of the bowel preparation was evaluated using the                            BBPS [facility] Bowel Preparation Scale) with scores                            of: Right Colon = 3, Transverse Colon = 3 and Left                            Colon = 3 (entire mucosa seen well with no residual                             staining, small fragments of stool or opaque                            liquid). The total BBPS score equals 9. Scope In: 1:18:20 PM Scope Out: 1:39:10 PM Scope Withdrawal Time: 0 hours 17 minutes 35 seconds  Total Procedure Duration: 0 hours 20 minutes 50 seconds  Findings:      Non-bleeding internal hemorrhoids were found.      Two sessile polyps were found in the ascending colon. The polyps were 4       to 8 mm in size. These polyps were removed with a cold snare. Resection       and retrieval were complete.      A 5 mm polyp was found in the transverse colon. The polyp was sessile.       The polyp was removed with a cold snare. Resection and retrieval were       complete.      The exam was otherwise without abnormality. Impression:               - Non-bleeding internal hemorrhoids.                           - Two 4 to 8 mm polyps in the ascending colon,                            removed with a cold snare. Resected and retrieved.                           - One 5 mm polyp in the transverse colon, removed                            with a cold snare. Resected and retrieved.                           - The examination was otherwise normal. Moderate Sedation:      Per Anesthesia Care Recommendation:           - Patient has a contact number available for  emergencies. The signs and symptoms of potential                            delayed complications were discussed with the                            patient. Return to normal activities tomorrow.                            Written discharge instructions were provided to the                            patient.                           - Resume previous diet.                           - Continue present medications.                           - Await pathology results.                           - Repeat colonoscopy in 5 years for surveillance.                           - Return to GI clinic  PRN. Procedure Code(s):        --- Professional ---                           [phone], Colonoscopy, flexible; with removal of                            tumor(s), polyp(s), or other lesion(s) by snare                            technique Diagnosis Code(s):        --- Professional ---                           Z12.11, Encounter for screening for malignant                            neoplasm of colon                           D12.2, Benign neoplasm of ascending colon                           D12.3, Benign neoplasm of transverse colon (hepatic                            flexure or splenic flexure)                           K64.8, Other hemorrhoids CPT copyright 2022  American Medical Association. All rights reserved. The codes documented in this report are preliminary and upon coder review may  be revised to meet current compliance requirements. Rolando Cliche. Mordechai April, DO Rolando Cliche. Mordechai April, DO 02/09/2024 1:41:33 PM This report has been signed electronically. Number of Addenda: 0
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
## 2024-02-09 NOTE — Transfer of Care (Addendum)
 Immediate Anesthesia Transfer of Care Note  Patient: Rachel [name]  Procedure(s) Performed: COLONOSCOPY  Patient Location: Short Stay  Anesthesia Type:General  Level of Consciousness: awake and patient cooperative  Airway & Oxygen Therapy: Patient Spontanous Breathing and Patient connected to nasal cannula oxygen  Post-op Assessment: Report given to RN and Post -op Vital signs reviewed and stable  Post vital signs: Reviewed and stable  Last Vitals:  Vitals Value Taken Time  BP 121/47 02/09/24   1343  Temp 36.5 02/09/24   1343  Pulse 70 02/09/24   1343  Resp 16 02/09/24   1343  SpO2 100% 02/09/24   1343    Last Pain:  Vitals:   02/09/24 1313  TempSrc:   PainSc: 0-No pain         Complications: No notable events documented.

## 2024-02-09 NOTE — Anesthesia Preprocedure Evaluation (Signed)
 Anesthesia Evaluation  Patient identified by MRN, date of birth, ID band Patient awake    Airway Mallampati: II  TM Distance: >3 FB     Dental no notable dental hx. (+) Teeth Intact   Pulmonary neg pulmonary ROS   breath sounds clear to auscultation       Cardiovascular hypertension,  Rhythm:Regular Rate:Normal     Neuro/Psych  PSYCHIATRIC DISORDERS Anxiety Depression     Neuromuscular disease    GI/Hepatic negative GI ROS, Neg liver ROS,,,  Endo/Other    Class 4 obesity  Renal/GU negative Renal ROS  negative genitourinary   Musculoskeletal  (+) Arthritis ,    Abdominal  (+) + obese  Peds  Hematology negative hematology ROS (+)   Anesthesia Other Findings   Reproductive/Obstetrics negative OB ROS                              Anesthesia Physical Anesthesia Plan  ASA: 3  Anesthesia Plan: General   Post-op Pain Management:    Induction: Intravenous  PONV Risk Score and Plan: 0 and Propofol infusion  Airway Management Planned: Nasal Cannula  Additional Equipment:   Intra-op Plan:   Post-operative Plan:   Informed Consent: I have reviewed the patients History and Physical, chart, labs and discussed the procedure including the risks, benefits and alternatives for the proposed anesthesia with the patient or authorized representative who has indicated his/her understanding and acceptance.       Plan Discussed with: CRNA and Surgeon  Anesthesia Plan Comments:          Anesthesia Quick Evaluation

## 2024-02-09 NOTE — Discharge Instructions (Addendum)
°  Colonoscopy Discharge Instructions  Read the instructions outlined below and refer to this sheet in the next few weeks. These discharge instructions provide you with general information on caring for yourself after you leave the hospital. Your doctor may also give you specific instructions. While your treatment has been planned according to the most current medical practices available, unavoidable complications occasionally occur.   ACTIVITY You may resume your regular activity, but move at a slower pace for the next 24 hours.  Take frequent rest periods for the next 24 hours.  Walking will help get rid of the air and reduce the bloated feeling in your belly (abdomen).  No driving for 24 hours (because of the medicine (anesthesia) used during the test).   Do not sign any important legal documents or operate any machinery for 24 hours (because of the anesthesia used during the test).  NUTRITION Drink plenty of fluids.  You may resume your normal diet as instructed by your doctor.  Begin with a light meal and progress to your normal diet. Heavy or fried foods are harder to digest and may make you feel sick to your stomach (nauseated).  Avoid alcoholic beverages for 24 hours or as instructed.  MEDICATIONS You may resume your normal medications unless your doctor tells you otherwise.  WHAT YOU CAN EXPECT TODAY Some feelings of bloating in the abdomen.  Passage of more gas than usual.  Spotting of blood in your stool or on the toilet paper.  IF YOU HAD POLYPS REMOVED DURING THE COLONOSCOPY: No aspirin products for 7 days or as instructed.  No alcohol for 7 days or as instructed.  Eat a soft diet for the next 24 hours.  FINDING OUT THE RESULTS OF YOUR TEST Not all test results are available during your visit. If your test results are not back during the visit, make an appointment with your caregiver to find out the results. Do not assume everything is normal if you have not heard from your  caregiver or the medical facility. It is important for you to follow up on all of your test results.  SEEK IMMEDIATE MEDICAL ATTENTION IF: You have more than a spotting of blood in your stool.  Your belly is swollen (abdominal distention).  You are nauseated or vomiting.  You have a temperature over 101.  You have abdominal pain or discomfort that is severe or gets worse throughout the day.   Your colonoscopy revealed 3 polyp(s) which I removed successfully. Await pathology results, my office will contact you. I recommend repeating colonoscopy in 5 years for surveillance purposes.   Otherwise follow up with GI as needed.    I hope you have a great rest of your week!  Charles K. Dolby, D.O. Gastroenterology and Hepatology [facility]  

## 2024-02-10 ENCOUNTER — Encounter (HOSPITAL_COMMUNITY): Payer: Self-pay | Admitting: Internal Medicine

## 2024-02-11 LAB — SURGICAL PATHOLOGY

## 2024-02-17 ENCOUNTER — Ambulatory Visit (HOSPITAL_COMMUNITY)
Admission: RE | Admit: 2024-02-17 | Discharge: 2024-02-17 | Disposition: A | Discharge status: Home / Self Care | Source: Ambulatory Visit | Attending: Otolaryngology | Admitting: Otolaryngology

## 2024-02-17 DIAGNOSIS — J322 Chronic ethmoidal sinusitis: Secondary | ICD-10-CM | POA: Diagnosis present

## 2024-02-17 DIAGNOSIS — J330 Polyp of nasal cavity: Secondary | ICD-10-CM | POA: Diagnosis present

## 2024-02-17 DIAGNOSIS — J320 Chronic maxillary sinusitis: Secondary | ICD-10-CM | POA: Diagnosis present

## 2024-02-24 ENCOUNTER — Ambulatory Visit (INDEPENDENT_AMBULATORY_CARE_PROVIDER_SITE_OTHER)

## 2024-02-24 VITALS — Ht 61.0 in | Wt 222.0 lb

## 2024-02-24 DIAGNOSIS — J322 Chronic ethmoidal sinusitis: Secondary | ICD-10-CM

## 2024-02-24 DIAGNOSIS — J329 Chronic sinusitis, unspecified: Secondary | ICD-10-CM

## 2024-02-24 DIAGNOSIS — R040 Epistaxis: Secondary | ICD-10-CM

## 2024-02-24 DIAGNOSIS — J320 Chronic maxillary sinusitis: Secondary | ICD-10-CM

## 2024-02-24 DIAGNOSIS — J330 Polyp of nasal cavity: Secondary | ICD-10-CM

## 2024-02-25 NOTE — Progress Notes (Signed)
 Patient ID: Rachel [name], female   DOB: Dec 23, 1955, 68 y.o.   MRN: [id number]  Follow-up: Recurrent right epistaxis, chronic nasal congestion, chronic rhinosinusitis  HPI: The patient is a 68 year old female who returns today for her follow-up evaluation.  The patient was last seen in March 2025.  At that time, she was complaining of recurrent right-sided epistaxis and chronic nasal congestion. 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 She previously underwent multiple nasal surgeries to remove the bony regrowth.  At the last visit, multiple hypervascular areas were noted on the right nasal septum.  She was also noted to have chronic rhinosinusitis, with obstruction of her maxillary and ethmoid sinuses by polypoid tissue.  She was treated with endoscopic cauterization of her right nasal septum.  She was encouraged to perform nasal saline irrigation daily.  Her sinus CT scan showed mucosal thickening of her nasal and sinus cavities.  However, no acute infection was noted.  The patient returns today reporting 2 more episodes of right-sided epistaxis.  She denies any facial pain, fever, or visual change.  Exam: General: Communicates without difficulty, well nourished, no acute distress. Head: Normocephalic, no evidence injury, no tenderness, facial buttresses intact without stepoff. Face/sinus: No tenderness to palpation and percussion. Facial movement is normal and symmetric. Eyes: PERRL, EOMI. No scleral icterus, conjunctivae clear. Neuro: CN II exam reveals vision grossly intact.  No nystagmus at any point of gaze. Ears: Auricles well formed without lesions.  Ear canals are intact without mass or lesion.  No erythema or edema is appreciated.  The TMs are intact without fluid. Nose: External evaluation reveals normal support and skin without lesions.  Dorsum is intact.  Anterior rhinoscopy reveals congested mucosa over anterior  aspect of inferior turbinates and intact septum.  Hypervascular areas are noted on the right nasal septum.  Oral:  Oral cavity and oropharynx are intact, symmetric, without erythema or edema.  Mucosa is moist without lesions. Neck: Full range of motion without pain.  There is no significant lymphadenopathy.  No masses palpable.  Thyroid bed within normal limits to palpation.  Parotid glands and submandibular glands equal bilaterally without mass.  Trachea is midline. Neuro:  CN 2-12 grossly intact.    Procedure:  Endoscopic control of recurrent right epistaxis. Indication:  Recurrent epistaxis  Description:  The right nasal cavity is sprayed with topical xylocaine  and neo-synephrine.  After adequate anesthesia is achieved, the nasal cavity is examined with a 0 rigid endoscope.  A suction catheter is inserted into parallel with the 0 endoscope, and it is used to suction blood clots from the nasal cavity.  Several hypervascular areas are noted on the anterior and superior portion of the septum. A silver nitrate stick is inserted in parallel with the 0 endoscope.  It is used to repeatedly cauterized the hypervascular areas.  Good hemostasis is achieved.   The patient tolerated the procedure well.    Assessment: 1.  Recurrent right epistaxis.  The patient is again noted to have multiple hypervascular areas on the right anterior and superior nasal septum. 2.  Bilateral chronic rhinosinusitis, involving the maxillary and ethmoid sinuses.  Mucoperiosteal thickening was noted on her CT scan.  No acute infection is noted today.  Plan: 1.  The physical exam findings and the CT images are reviewed with the patient. 2.  Repeat endoscopic cauterization on the right nasal septum. 3.  Continue nasal saline irrigation daily.  The patient cannot tolerate  the use of steroid nasal spray due to the side effects. 4.  The patient will return for reevaluation in 2 months, sooner if needed.

## 2024-04-26 ENCOUNTER — Ambulatory Visit (INDEPENDENT_AMBULATORY_CARE_PROVIDER_SITE_OTHER): Admitting: Otolaryngology

## 2024-05-19 ENCOUNTER — Ambulatory Visit (INDEPENDENT_AMBULATORY_CARE_PROVIDER_SITE_OTHER): Admitting: Otolaryngology

## 2024-05-19 ENCOUNTER — Other Ambulatory Visit: Payer: Self-pay | Admitting: Family Medicine

## 2024-05-19 ENCOUNTER — Encounter (INDEPENDENT_AMBULATORY_CARE_PROVIDER_SITE_OTHER): Payer: Self-pay | Admitting: Otolaryngology

## 2024-05-19 VITALS — HR 79 | Ht 61.0 in | Wt 245.0 lb

## 2024-05-19 DIAGNOSIS — J322 Chronic ethmoidal sinusitis: Secondary | ICD-10-CM | POA: Diagnosis not present

## 2024-05-19 DIAGNOSIS — R040 Epistaxis: Secondary | ICD-10-CM

## 2024-05-19 DIAGNOSIS — R600 Localized edema: Secondary | ICD-10-CM

## 2024-05-19 DIAGNOSIS — J320 Chronic maxillary sinusitis: Secondary | ICD-10-CM | POA: Diagnosis not present

## 2024-05-20 NOTE — Progress Notes (Signed)
 Patient ID: Rachel [name], female   DOB: [birth year], 68 y.o.   MRN: [id number]  Follow-up: Chronic rhinosinusitis, chronic nasal congestion, recurrent epistaxis  HPI: The patient is a 68 year old female who returns today for her follow-up evaluation.  The patient has a history of bilateral chronic rhinosinusitis and recurrent bony regrowth within her sinonasal cavities.  She previously underwent multiple endoscopic sinus surgeries.  In addition, the patient also has a history of recurrent epistaxis.  At her last visit in April 2025, multiple hypervascular areas were noted on the right nasal septum.  The hypervascular areas were cauterized.  She was continued on nasal saline irrigation daily.  The patient cannot tolerate the use of steroid nasal sprays due to the side effects.  The patient returns today complaining of recurrent left-sided epistaxis.  She has not had any significant right-sided bleeding since the cauterization procedure.  Her last bleeding episode was 3 days ago.  She denies any recent sinusitis.  Currently she denies any facial pain, fever, or visual change.  Exam: General: Communicates without difficulty, well nourished, no acute distress. Head: Normocephalic, no evidence injury, no tenderness, facial buttresses intact without stepoff. Face/sinus: No tenderness to palpation and percussion. Facial movement is normal and symmetric. Eyes: PERRL, EOMI. No scleral icterus, conjunctivae clear. Neuro: CN II exam reveals vision grossly intact.  No nystagmus at any point of gaze. Ears: Auricles well formed without lesions.  Ear canals are intact without mass or lesion.  No erythema or edema is appreciated.  The TMs are intact without fluid. Nose: External evaluation reveals normal support and skin without lesions.  Dorsum is intact.  Anterior rhinoscopy reveals congested mucosa over anterior aspect of inferior turbinates and intact septum.  No purulence noted. Oral:  Oral cavity and oropharynx  are intact, symmetric, without erythema or edema.  Mucosa is moist without lesions. Neck: Full range of motion without pain.  There is no significant lymphadenopathy.  No masses palpable.  Thyroid bed within normal limits to palpation.  Parotid glands and submandibular glands equal bilaterally without mass.  Trachea is midline. Neuro:  CN 2-12 grossly intact.   Procedure:  Endoscopic control of recurrent left epistaxis. Indication:  Recurrent epistaxis  Description:  The left nasal cavity is sprayed with topical xylocaine  and neo-synephrine.  After adequate anesthesia is achieved, the nasal cavity is examined with a 0 rigid endoscope.  A suction catheter is inserted in parallel with the 0 endoscope, and it is used to suction blood clots from the nasal cavity.  Several hypervascular areas are noted on the anterior and superior portion of the septum. A silver nitrate stick is inserted in parallel with the 0 endoscope.  It is used to repeatedly cauterized the hypervascular areas.  Good hemostasis is achieved.  The patient tolerated the procedure well.    Assessment: 1.  Bilateral chronic maxillary and ethmoid rhinosinusitis.  No purulent drainage or recent acute infection is noted today. 2.  Recurrent left epistaxis.  The patient has not had any right-sided bleeding since her cauterization procedure 2 months ago.  Several hypervascular areas are noted on the left nasal septum today.  Plan: 1.  The physical exam findings are reviewed with the patient. 2.  Endoscopic cauterization of the left nasal septum. 3.  Continue nasal saline irrigation daily. 4.  Nasal ointment and humidifier as needed. 5.  The patient will return for reevaluation in 2 to 3 months.

## 2024-05-25 ENCOUNTER — Ambulatory Visit
Admission: RE | Admit: 2024-05-25 | Discharge: 2024-05-25 | Disposition: A | Discharge status: Home / Self Care | Source: Ambulatory Visit | Attending: Family Medicine | Admitting: Family Medicine

## 2024-05-25 DIAGNOSIS — R600 Localized edema: Secondary | ICD-10-CM

## 2024-07-28 ENCOUNTER — Ambulatory Visit (INDEPENDENT_AMBULATORY_CARE_PROVIDER_SITE_OTHER): Admitting: Otolaryngology

## 2024-08-03 ENCOUNTER — Encounter (INDEPENDENT_AMBULATORY_CARE_PROVIDER_SITE_OTHER): Payer: Self-pay | Admitting: Otolaryngology

## 2024-08-03 ENCOUNTER — Ambulatory Visit (INDEPENDENT_AMBULATORY_CARE_PROVIDER_SITE_OTHER): Admitting: Otolaryngology

## 2024-08-03 VITALS — HR 72 | Temp 97.8°F | Ht 60.0 in | Wt 238.0 lb

## 2024-08-03 DIAGNOSIS — R040 Epistaxis: Secondary | ICD-10-CM

## 2024-08-03 NOTE — Progress Notes (Signed)
 Patient ID: Rachel [name], female   DOB: 05/13/1956, 68 y.o.   MRN: [id number]  Procedure:  Endoscopic control of recurrent right epistaxis  History of Present Illness Rachel [name] is a 68 year old female with recurrent epistaxis and bilateral chronic maxillary and ethmoid sinusitis who presents with right-sided epistaxis.  She was last seen in July 2025 for recurrent left epistaxis and bilateral chronic maxillary and ethmoid sinusitis. At that time, her left nasal septum was cauterized.  She experienced significant bleeding from the right side of her nose on Friday night, which was the only episode of bleeding in the past two months. No recent bleeding from the left side.  She has been using a steroid nasal spray and performs salt water irrigation twice a day. No recent sinus infections in the past month or two.   Description:  The right nasal cavity is sprayed with topical xylocaine  and neo-synephrine.  After adequate anesthesia is achieved, the nasal cavity is examined with a 0 rigid endoscope.  A suction catheter is inserted in parallel with the 0 endoscope, and it is used to suction blood clots from the right nasal cavity.  Hypervascular areas are noted at the anterior and superior aspect of the nasal septum.  A silver nitrate stick is inserted in parallel with the 0 endoscope.  It is used to repeatedly cauterized the hypervascular areas.  Good hemostasis is achieved.  The patient tolerated the procedure well.  Assessment: 1.  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   2.  Active bleeding is noted today.    Plan: 1. Endoscopic cauterization of the right anterior and superior nasal septum. 2. The nasal endoscopy findings are reviewed with the patient. 3. Nasal ointment/humidifier to treat the nasal dryness. 4. The patient will return for re-evaluation in 3 months.

## 2024-11-19 ENCOUNTER — Ambulatory Visit (INDEPENDENT_AMBULATORY_CARE_PROVIDER_SITE_OTHER): Admitting: Otolaryngology

## 2024-12-14 ENCOUNTER — Ambulatory Visit (INDEPENDENT_AMBULATORY_CARE_PROVIDER_SITE_OTHER): Admitting: Otolaryngology
